# Patient Record
Sex: Female | Born: 1950
Health system: Southern US, Community
[De-identification: ages and names within clinical notes are randomized; demographics above are authoritative.]

## PROBLEM LIST (undated history)

## (undated) DIAGNOSIS — Z923 Personal history of irradiation: Secondary | ICD-10-CM

## (undated) DIAGNOSIS — K635 Polyp of colon: Secondary | ICD-10-CM

## (undated) DIAGNOSIS — I4891 Unspecified atrial fibrillation: Secondary | ICD-10-CM

## (undated) DIAGNOSIS — T7840XA Allergy, unspecified, initial encounter: Secondary | ICD-10-CM

## (undated) DIAGNOSIS — Z860100 Personal history of colon polyps, unspecified: Secondary | ICD-10-CM

## (undated) DIAGNOSIS — F419 Anxiety disorder, unspecified: Secondary | ICD-10-CM

## (undated) DIAGNOSIS — E785 Hyperlipidemia, unspecified: Secondary | ICD-10-CM

## (undated) DIAGNOSIS — D219 Benign neoplasm of connective and other soft tissue, unspecified: Secondary | ICD-10-CM

## (undated) DIAGNOSIS — M1990 Unspecified osteoarthritis, unspecified site: Secondary | ICD-10-CM

## (undated) DIAGNOSIS — R011 Cardiac murmur, unspecified: Secondary | ICD-10-CM

## (undated) DIAGNOSIS — K219 Gastro-esophageal reflux disease without esophagitis: Secondary | ICD-10-CM

## (undated) DIAGNOSIS — E663 Overweight: Secondary | ICD-10-CM

## (undated) DIAGNOSIS — B019 Varicella without complication: Secondary | ICD-10-CM

## (undated) HISTORY — PX: NOSE SURGERY: SHX723

## (undated) HISTORY — DX: Hyperlipidemia, unspecified: E78.5

## (undated) HISTORY — DX: Benign neoplasm of connective and other soft tissue, unspecified: D21.9

## (undated) HISTORY — PX: TONSILLECTOMY: SUR1361

## (undated) HISTORY — DX: Anxiety disorder, unspecified: F41.9

## (undated) HISTORY — DX: Allergy, unspecified, initial encounter: T78.40XA

## (undated) HISTORY — PX: ROTATOR CUFF REPAIR: SHX139

## (undated) HISTORY — DX: Personal history of colonic polyps: Z86.010

## (undated) HISTORY — DX: Cardiac murmur, unspecified: R01.1

## (undated) HISTORY — DX: Personal history of colon polyps, unspecified: Z86.0100

## (undated) HISTORY — DX: Overweight: E66.3

## (undated) HISTORY — DX: Varicella without complication: B01.9

## (undated) HISTORY — DX: Unspecified osteoarthritis, unspecified site: M19.90

## (undated) HISTORY — DX: Gastro-esophageal reflux disease without esophagitis: K21.9

## (undated) HISTORY — PX: WISDOM TOOTH EXTRACTION: SHX21

## (undated) HISTORY — DX: Polyp of colon: K63.5

---
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
## 1999-09-17 ENCOUNTER — Encounter: Payer: Self-pay | Admitting: Emergency Medicine

## 1999-09-17 ENCOUNTER — Emergency Department (HOSPITAL_COMMUNITY): Admission: EM | Admit: 1999-09-17 | Discharge: 1999-09-17 | Payer: Self-pay | Admitting: Emergency Medicine

## 2001-01-07 ENCOUNTER — Encounter: Payer: Self-pay | Admitting: Occupational Medicine

## 2001-01-07 ENCOUNTER — Encounter: Admission: RE | Admit: 2001-01-07 | Discharge: 2001-01-07 | Payer: Self-pay | Admitting: Occupational Medicine

## 2001-01-14 ENCOUNTER — Other Ambulatory Visit: Admission: RE | Admit: 2001-01-14 | Discharge: 2001-01-14 | Payer: Self-pay | Admitting: *Deleted

## 2001-04-06 ENCOUNTER — Ambulatory Visit (HOSPITAL_BASED_OUTPATIENT_CLINIC_OR_DEPARTMENT_OTHER): Admission: RE | Admit: 2001-04-06 | Discharge: 2001-04-07 | Payer: Self-pay | Admitting: Orthopedic Surgery

## 2001-05-26 ENCOUNTER — Emergency Department (HOSPITAL_COMMUNITY): Admission: EM | Admit: 2001-05-26 | Discharge: 2001-05-27 | Payer: Self-pay | Admitting: Emergency Medicine

## 2002-02-01 ENCOUNTER — Encounter: Payer: Self-pay | Admitting: *Deleted

## 2002-02-01 ENCOUNTER — Ambulatory Visit (HOSPITAL_COMMUNITY): Admission: RE | Admit: 2002-02-01 | Discharge: 2002-02-01 | Payer: Self-pay | Admitting: *Deleted

## 2004-07-25 ENCOUNTER — Encounter: Admission: RE | Admit: 2004-07-25 | Discharge: 2004-07-25 | Payer: Self-pay | Admitting: Cardiovascular Disease

## 2004-07-30 ENCOUNTER — Other Ambulatory Visit: Admission: RE | Admit: 2004-07-30 | Discharge: 2004-07-30 | Payer: Self-pay | Admitting: Obstetrics and Gynecology

## 2004-08-08 ENCOUNTER — Ambulatory Visit (HOSPITAL_COMMUNITY): Admission: RE | Admit: 2004-08-08 | Discharge: 2004-08-08 | Payer: Self-pay | Admitting: Obstetrics and Gynecology

## 2004-09-11 ENCOUNTER — Ambulatory Visit (HOSPITAL_COMMUNITY): Admission: RE | Admit: 2004-09-11 | Discharge: 2004-09-11 | Payer: Self-pay | Admitting: Obstetrics and Gynecology

## 2004-10-29 ENCOUNTER — Encounter (INDEPENDENT_AMBULATORY_CARE_PROVIDER_SITE_OTHER): Payer: Self-pay | Admitting: *Deleted

## 2004-10-29 ENCOUNTER — Ambulatory Visit (HOSPITAL_COMMUNITY): Admission: RE | Admit: 2004-10-29 | Discharge: 2004-10-29 | Payer: Self-pay | Admitting: Obstetrics and Gynecology

## 2006-09-12 ENCOUNTER — Emergency Department (HOSPITAL_COMMUNITY): Admission: EM | Admit: 2006-09-12 | Discharge: 2006-09-12 | Payer: Self-pay | Admitting: Internal Medicine

## 2007-05-26 ENCOUNTER — Emergency Department (HOSPITAL_COMMUNITY): Admission: EM | Admit: 2007-05-26 | Discharge: 2007-05-26 | Payer: Self-pay | Admitting: Emergency Medicine

## 2007-10-17 ENCOUNTER — Emergency Department (HOSPITAL_COMMUNITY): Admission: EM | Admit: 2007-10-17 | Discharge: 2007-10-17 | Payer: Self-pay | Admitting: Family Medicine

## 2007-11-23 ENCOUNTER — Ambulatory Visit: Payer: Self-pay | Admitting: Family Medicine

## 2007-12-23 ENCOUNTER — Emergency Department (HOSPITAL_COMMUNITY): Admission: EM | Admit: 2007-12-23 | Discharge: 2007-12-23 | Payer: Self-pay | Admitting: Family Medicine

## 2008-01-11 ENCOUNTER — Other Ambulatory Visit: Admission: RE | Admit: 2008-01-11 | Discharge: 2008-01-11 | Payer: Self-pay | Admitting: *Deleted

## 2008-03-13 ENCOUNTER — Other Ambulatory Visit: Admission: RE | Admit: 2008-03-13 | Discharge: 2008-03-13 | Payer: Self-pay | Admitting: Gynecology

## 2010-10-10 ENCOUNTER — Emergency Department (HOSPITAL_COMMUNITY)
Admission: EM | Admit: 2010-10-10 | Discharge: 2010-10-10 | Payer: Self-pay | Source: Home / Self Care | Admitting: Family Medicine

## 2010-11-23 ENCOUNTER — Encounter: Payer: Self-pay | Admitting: Cardiology

## 2011-01-12 LAB — POCT I-STAT, CHEM 8
BUN: 10 mg/dL (ref 6–23)
Calcium, Ion: 1.15 mmol/L (ref 1.12–1.32)
HCT: 43 % (ref 36.0–46.0)
Hemoglobin: 14.6 g/dL (ref 12.0–15.0)
Potassium: 3.9 mEq/L (ref 3.5–5.1)
Sodium: 139 mEq/L (ref 135–145)

## 2011-03-20 NOTE — Op Note (Signed)
NAMENORETA, KUE                ACCOUNT NO.:  [id number]   MEDICAL RECORD NO.:  [id number]          PATIENT TYPE:  AMB   LOCATION:  SDC                           FACILITY:  [facility]   PHYSICIAN:  Naima A. Dillard, M.D. DATE OF BIRTH:  [birth year]   DATE OF PROCEDURE:  10/29/2004  DATE OF DISCHARGE:                                 OPERATIVE REPORT   PREOPERATIVE DIAGNOSES:  1.  Menorrhagia.  2.  Uterine fibroids.   POSTOPERATIVE DIAGNOSES:  1.  Menorrhagia.  2.  Uterine fibroids.   PROCEDURE:  Dilatation and curettage, hysteroscopy, endometrial ablation  with NovaSure.   SURGEON:  Naima A. Dillard, M.D.   ESTIMATED BLOOD LOSS:  Minimal.   FLUIDS REPLACED:  1300 crystalloid.   COMPLICATIONS:  None.   ANESTHESIA:  Laryngeal mask airway general.   FINDINGS:  Vulvar and vaginal exams within normal limits.  Uterus was about  8 cm, no adnexal masses palpated.  The uterus did sound to 9 cm.  Cervical  length was 4 cm, cavity length was 5 cm, cavity width was 4.6 cm, and power  was 127.  The time of ablation was 1 minute 3 seconds.   PROCEDURE IN DETAIL:  The patient was taken to the operating room, where she  was given anesthesia with laryngeal mask airway, prepped and draped in a  normal sterile fashion.  The bladder was drained of about 20 mL.  A bivalve  speculum was placed into the vagina.  The anterior lip of the cervix was  grasped with a single-tooth tenaculum.  The cervix was already dilated.  This patient is on her menses and the uterus did sound to 9 cm .  The  cervical length measured about 4 cm with a hegar.  The hysteroscope was then  placed into the uterine cavity, and there was fluffy endometrium.  No  submucosal component of fibroid could be seen.  The hysteroscope was then  removed and the sharp curettage was done of the uterine cavity until a  gritty texture was noted.  Hemostasis was assured.  With the representative,  Adam, from [employer], we then began the  procedure.  The cavity length was set  at 5 cm.  The NovaSure was then placed into the cavity.  The CO2 seal was  then tested, noted to be normal.  The uterus was then seated without  difficulty.  The ablation was then done without difficulty.  The NovaSure  was removed.  All  instruments were removed from the vagina.  The tenaculum was removed from  the cervix without difficulty.  There was some bleeding at the tenaculum  site, which was made hemostatic with silver nitrate.  Sponge, lap, and  needle counts were correct x2.     Naim   NAD/MEDQ  D:  10/29/2004  T:  10/29/2004  Job:  [id number]

## 2011-03-20 NOTE — H&P (Signed)
[name], [name]                ACCOUNT NO.:  [id number]   MEDICAL RECORD NO.:  [id number]          PATIENT TYPE:  AMB   LOCATION:  SDC                           FACILITY:  [facility]   PHYSICIAN:  Naima A. Dillard, M.D. DATE OF BIRTH:  27-Mar-1951   DATE OF ADMISSION:  DATE OF DISCHARGE:                                HISTORY & PHYSICAL   CHIEF COMPLAINT:  Menorrhagia.   HISTORY OF PRESENT ILLNESS:  The patient is a 60 year old gravida 1, para 1  who presented with complaint of increase in the flow of her menses over the  past couple of months.  She states it has been heavier than normal with  cramping.  She states that her periods come every month and last for 7 days.  She is changing a tampon to a pad every 1 to 4 hours.  Denies taking any  contraception.  She does have fibroids.  No history of hormone therapy.  She  does have some menopausal symptoms.  She is currently not on any new  medications.  Denies any vaginal discharge or abdominal pain.  Does have  some increased stress.   PAST GYNECOLOGICAL HISTORY:  1.  Significant for lichen sclerosis, just newly diagnosed.  2.  She had an abnormal Pap smear many years ago.  Repeat Pap smear was      within normal limits and her last Pap smear was normal.  3.  Denies any history of any sexually transmitted diseases.   ALLERGIES:  The patient has no known drug allergies.   MEDICATIONS:  Xanax.   PAST OBSTETRICAL HISTORY:  Vaginal delivery x1.   PAST SURGICAL HISTORY:  1.  Repair of left rotator cuff tear in 2002.  2.  She had a tonsillectomy.  3.  She had a bilateral tubal ligation.   FAMILY HISTORY:  1.  Heart disease.  2.  Hypertension.  3.  Diabetes.   REVIEW OF SYSTEMS:  HEENT:  She wars corrective lenses.  PSYCHIATRIC:  She  has anxiety.  GENITOURINARY:  As above.  GI:  Unremarkable.  CARDIOVASCULAR:  Unremarkable.  ENDOCRINE:  Unremarkable.   PHYSICAL EXAMINATION:  VITAL SIGNS:  The patient weighs 207 pounds.  Blood  pressure is 108/68.  GENERAL:  She is in no apparent distress.  HEART:  Regular rate and rhythm.  HEENT:  Within normal limits.  NECK:  Free range of motion and supple.  LUNGS:  Clear to auscultation bilaterally.  BREASTS:  Without masses, nipple discharge, skin changes, or nipple  retraction bilaterally.  ABDOMEN:  Nondistended, soft, and nontender.  The patient has no adenopathy.  PELVIC:  External genitalia are within normal limits.  Vagina is within  normal limits.  Cervix is within normal limits with no cervical motion  tenderness or masses.  Uterus is normal size, shape, and consistency and  nontender.  There are no adnexal masses palpated.  RECTAL:  No masses with normal tone.  EXTREMITIES:  No cyanosis, clubbing, or edema bilaterally.  SKIN:  The patient has moist mucous membranes.   PERTINENT LABORATORY DATA:  The patient had  an ultrasound and  sonohysterogram.  On ultrasound, her uterus measured 9.9 x 5.8 x 6.1 cm.  Endometrial stripe was 2.3 cm.  She has an anterior fibroid measuring 2 x  1.9 cm with a 15% component that is submucosal on sonohysterogram.  Her GC  and Chlamydia were negative.  Pap smear was within normal limits.  Endometrial biopsy shows benign secretory endometrium.  TSH is normal at  1.687.   ASSESSMENT:  Menorrhagia and intramural fibroid with small submucosal  component.   PLAN:  A D&C, hysteroscopy, possible resection of the submucosal component  of the fibroid if able to be visualized, and endometrial ablation.  The  patient understands that the risks are, but not limited to, bleeding;  infection; perforation of the uterus; damage to internal organs like bowel  and bladder and major blood vessels especially using the Novasure.  The  patient has consented to this procedure.     Naim   NAD/MEDQ  D:  10/29/2004  T:  10/29/2004  Job:  [id number]

## 2011-03-20 NOTE — Op Note (Signed)
. [facility]  Patient:    [name], [name]                         MRN: [id number] Proc. Date: 04/06/01 Adm. Date:  [id number] Attending:  Teena Dunk                           Operative Report  PREOPERATIVE DIAGNOSIS:  Impingement with partial rotator cuff tear.  POSTOPERATIVE DIAGNOSES: 1. Impingement with partial rotator cuff tear. 2. Degenerative tear of anterior superior labrum. 3. Acromioclavicular joint arthritis. 4. Adhesive capsulitis of the shoulder.  PROCEDURES: 1. Examination under anesthesia. 2. Arthroscopic acromioplasty with rotator cuff debridement. 3. Arthroscopic debridement, torn labrum. 4. Arthroscopic excision, distal clavicle.  SURGEON:  Sharlot Gowda., M.D.  ANESTHESIA:  General.  INDICATIONS:  A 60 year old, severe shoulder pain with MRI suggesting complete cuff tear, thought to be amenable as an overnight hospitalization for arthroscopy and possible repair.  DESCRIPTION OF PROCEDURE:  Arthroscope through a posterior portal anterior and lateral.  Prior to systematic inspection of the joint, shoulder was noted to have limited abduction to 80 and external rotation to 60.  Therefore, at the conclusion of the case, the arm was manipulated with 120 degrees of abduction and external rotation to 90.  The intra-articular portion of the exam showed some fraying-type tearing of the labrum, some degenerative tearing at the attachment of the biceps tendon, but no frank instability.  Debridement was carried out of the labral tissue.  No degenerative change was noted on the humerus.  Undersurface small partial-thickness cuff tear.  It was more of an abrasion-type superior bursal surface tear.  It was then not complete, which was debrided.  Moderate impingement from a prominent CA ligament, AC joint arthritis, which was moderate, requiring resection of the distal centimeter, centimeter and a half of the clavicle,  release of the impingement with resection of about 7-8 mm bone from the anterior leading edge of the acromion. Complete bursectomy from what was a moderately inflamed bursa.  Superior surface of the cuff, _____ complete tear.  Resection last checked from the anterior and lateral portal.  Shoulder was drained free of fluid, portals closed with nylon, lightly compressive sterile dressing applied.  Taken to the recovery room in stable condition. DD:  04/06/01 TD:  04/07/01 Job: [id number]

## 2011-04-21 ENCOUNTER — Encounter: Payer: Self-pay | Admitting: Internal Medicine

## 2012-05-01 ENCOUNTER — Emergency Department (HOSPITAL_COMMUNITY)
Admission: EM | Admit: 2012-05-01 | Discharge: 2012-05-01 | Disposition: A | Discharge status: Home / Self Care | Payer: Self-pay | Attending: Emergency Medicine | Admitting: Emergency Medicine

## 2012-05-01 ENCOUNTER — Encounter (HOSPITAL_COMMUNITY): Payer: Self-pay | Admitting: Emergency Medicine

## 2012-05-01 DIAGNOSIS — F419 Anxiety disorder, unspecified: Secondary | ICD-10-CM

## 2012-05-01 DIAGNOSIS — F411 Generalized anxiety disorder: Secondary | ICD-10-CM | POA: Insufficient documentation

## 2012-05-01 DIAGNOSIS — N318 Other neuromuscular dysfunction of bladder: Secondary | ICD-10-CM | POA: Insufficient documentation

## 2012-05-01 DIAGNOSIS — N3281 Overactive bladder: Secondary | ICD-10-CM

## 2012-05-01 LAB — URINALYSIS, ROUTINE W REFLEX MICROSCOPIC
Bilirubin Urine: NEGATIVE
Glucose, UA: NEGATIVE mg/dL
Ketones, ur: NEGATIVE mg/dL
Nitrite: NEGATIVE
Protein, ur: NEGATIVE mg/dL
Specific Gravity, Urine: 1.011 (ref 1.005–1.030)
Urobilinogen, UA: 0.2 mg/dL (ref 0.0–1.0)
pH: 7.5 (ref 5.0–8.0)

## 2012-05-01 LAB — URINE MICROSCOPIC-ADD ON

## 2012-05-01 MED ORDER — PHENAZOPYRIDINE HCL 100 MG PO TABS
200.0000 mg | ORAL_TABLET | Freq: Once | ORAL | Status: AC
  Administered 2012-05-01: 200 mg via ORAL
  Filled 2012-05-01: qty 2

## 2012-05-01 MED ORDER — ALPRAZOLAM 0.25 MG PO TABS: 0.2500 mg | ORAL_TABLET | Freq: Three times a day (TID) | ORAL | Status: AC | PRN

## 2012-05-01 NOTE — ED Notes (Signed)
Pt discharged home. Encouraged to follow up with PCP if problems continue.

## 2012-05-01 NOTE — ED Notes (Addendum)
Pt here with multiple complaints ongoing since January. Pt reports urinary frequency and pressure, feeling stress in her life due to family issues and upper back pain. Pt took an antibiotic from her boyfriend and using over the counter probiotic.

## 2012-05-01 NOTE — ED Provider Notes (Addendum)
History   This chart was scribed for Gavin Pound. Tymere Depuy, MD scribed by Magnus Sinning. The patient was seen in room TR11C/TR11C seen at 13:37  CSN: [id number]  Arrival date & time 05/01/12  1303   None     Chief Complaint  Patient presents with  . Urinary Frequency  . Back Pain    (Consider location/radiation/quality/duration/timing/severity/associated sxs/prior treatment) HPI [name] is a 61 y.o. female who presents to the Emergency Department complaining of constant moderate abd pressure with associated increased urinary frequency, onset several months. Also reports associated intermittent back pain, onset several days, that is aggravated upon standing, after she has been laying down for long periods of time.States she has been drinking cranberry juice for urinary sxs with no relief. Patient explains she has been under tremendous stress and that it's caused her to be more sedentary at home ,which she suspects might also be aggravating. She says she's taken probiotic and an abx of amoxil, which she states she's taken today with mild relief. Denies fevers, or vomiting. Patient provides document from recent examination, and EDMD notes C-reactive protein, creatinine, and TSH are all within normal limits. Hx of one vaginal delivery at 61 years old and hx of UTI several years ago. Denies any other vaginal  sxs.    History reviewed. No pertinent past medical history.  Past Surgical History  Procedure Date  . Tonsillectomy     History reviewed. No pertinent family history.  History  Substance Use Topics  . Smoking status: Never Smoker   . Smokeless tobacco: Not on file  . Alcohol Use: Yes     seldom   Review of Systems  Constitutional: Negative for fever.  Gastrointestinal: Negative for vomiting.       Abd pressure  Genitourinary: Positive for frequency.    Allergies  Review of patient's allergies indicates no known allergies.  Home Medications   Current Outpatient Rx    Name Route Sig Dispense Refill  . VITAMIN C PO Oral Take 1 tablet by mouth daily.    Marland Kitchen CALCIUM-VITAMIN D PO Oral Take 1 tablet by mouth daily.    Marland Kitchen FLORA-Q PO Oral Take 1 capsule by mouth daily.    Marland Kitchen ALPRAZOLAM 0.25 MG PO TABS Oral Take 1 tablet (0.25 mg total) by mouth 3 (three) times daily as needed for anxiety. 15 tablet 0    BP 127/58  Pulse 81  Temp 98.3 F (36.8 C) (Oral)  Resp 18  SpO2 96%  LMP 04/28/2012  Physical Exam  Nursing note and vitals reviewed. Constitutional: She is oriented to person, place, and time. She appears well-developed and well-nourished. No distress.  HENT:  Head: Normocephalic and atraumatic.  Eyes: EOM are normal. No scleral icterus.  Neck: Normal range of motion. Neck supple. No tracheal deviation present.  Cardiovascular: Normal rate.   Pulmonary/Chest: Effort normal. No respiratory distress.  Abdominal: Soft. Normal appearance and bowel sounds are normal. There is no tenderness. There is no rebound, no guarding, no CVA tenderness, no tenderness at McBurney's point and negative Murphy's sign.       Negative Murphy's   Musculoskeletal: Normal range of motion.       Lumbar back: She exhibits normal range of motion, no bony tenderness, no deformity, no spasm and normal pulse.  Neurological: She is alert and oriented to person, place, and time. She has normal strength. No sensory deficit. She exhibits normal muscle tone. Gait normal.  Skin: Skin is warm, dry and intact. No  rash noted. No pallor.  Psychiatric: She has a normal mood and affect. Her behavior is normal.    ED Course  Procedures (including critical care time) DIAGNOSTIC STUDIES: Oxygen Saturation is 96% on room air , normal by my interpretation.    COORDINATION OF CARE:   Labs Reviewed  URINALYSIS, ROUTINE W REFLEX MICROSCOPIC - Abnormal; Notable for the following:    APPearance CLOUDY (*)     Hgb urine dipstick TRACE (*)     Leukocytes, UA MODERATE (*)     All other components  within normal limits  URINE MICROSCOPIC-ADD ON - Abnormal; Notable for the following:    Squamous Epithelial / LPF FEW (*)     All other components within normal limits      1. Overactive bladder   2. Anxiety     2:59 PM UA suggests to UTI with fe bacteria and 0-2 WBC's.  Culture sent.    3:14 PM  Pt requests something for anxiety.  She admits that she is stressed due primarily with sister who has stage 4 cancer.  She hasn't been sleeping well.  She thinks some of her symptoms may be due to this.  She has been put on xanax in the past by her PCP.  She would like just a few days of this.  She will see PCP to reassess this in the near future.  Will give her a lo dose Rx for xanax here.     MDM  I personally performed the services described in this documentation, which was scribed in my presence. The recorded information has been reviewed and considered.   Pt in no distress.  abd is soft, no guard or rebound, non tender.  No GYN symptoms.  Dysuria and symptoms of bladder spasms for months.  Pt encouraged to obtain PCP.  Will check for UTI here and also try pyridium for symptom relief.              Gavin Pound. Oletta Lamas, MD 05/01/12 1504  Gavin Pound. Tangelia Sanson, MD 05/01/12 1516

## 2012-05-01 NOTE — Discharge Instructions (Signed)
Overactive Bladder, Adult  The bladder has two functions that are totally opposite of the other. One is to relax and stretch out so it can store urine (fills like a balloon), and the other is to contract and squeeze down so that it can empty the urine that it has stored. Proper functioning of the bladder is a complex mixing of these two functions. The filling and emptying of the bladder can be influenced by:   The bladder.    The spinal cord.    The brain.    The nerves going to the bladder.    Other organs that are closely related to the bladder such as prostate in males and the vagina in females.   As your bladder fills with urine, nerve signals are sent from the bladder to the brain to tell you that you may need to urinate. Normal urination requires that the bladder squeeze down with sufficient strength to empty the bladder, but this also requires that the bladder squeeze down sufficiently long to finish the job. In addition the sphincter muscles, which normally keep you from leaking urine, must also relax so that the urine can pass. Coordination between the bladder muscle squeezing down and the sphincter muscles relaxing is required to make everything happen normally.  With an overactive bladder sometimes the muscles of the bladder contract unexpectedly and involuntarily and this causes an urgent need to urinate. The normal response is to try to hold urine in by contracting the sphincter muscles. Sometimes the bladder contracts so strongly that the sphincter muscles cannot stop the urine from passing out and incontinence occurs. This kind of incontinence is called urge incontinence.  Having an overactive bladder can be embarrassing and awkward. It can keep you from living life the way you want to. Many people think it is just something you have to put up with as you grow older or have certain health conditions. In fact, there are treatments that can help make your life easier and more pleasant.  CAUSES     Many things can cause an overactive bladder. Possibilities include:   Urinary tract infection or infection of nearby tissues such as the prostate.    Prostate enlargement.    In women, multiple pregnancies or surgery on the uterus or urethra.    Bladder stones, inflammation or tumors.    Caffeine.    Alcohol.    Medications. For example, diuretics (drugs that help the body get rid of extra fluid) increase urine production. Some other medicines must be taken with lots of fluids.    Muscle or nerve weakness. This might be the result of a spinal cord injury, a stroke, multiple sclerosis or Parkinson's disease.    Diabetes can cause a high urine volume which fills the bladder so quickly that the normal urge to urinate is triggered very strongly.   SYMPTOMS     Loss of bladder control. You feel the need to urinate and cannot make your body wait.    Sudden, strong urges to urinate.    Urinating 8 or more times a day.    Waking up to urinate two or more times a night.   DIAGNOSIS    To decide if you have overactive bladder, your healthcare provider will probably:   Ask about symptoms you have noticed.    Ask about your overall health. This will include questions about any medications you are taking.    Do a physical examination. This will help determine if there are   obvious blockages or other problems.    Order some tests. These might include:    A blood test to check for diabetes or other health issues that could be contributing to the problem.    Urine testing. This could measure the flow of urine and the pressure on the bladder.    A test of your neurological system (the brain, spinal cord and nerves). This is the system that senses the need to urinate. Some of these tests are called flow tests, bladder pressure tests and electrical measurements of the sphincter muscle.    A bladder test to check whether it is emptying completely when you urinate.     Cytoscopy. This test uses a thin tube with a tiny camera on it. It offers a look inside your urethra and bladder to see if there are problems.    Imaging tests. You might be given a contrast dye and then asked to urinate. X-rays are taken to see how your bladder is working.   TREATMENT    An overactive bladder can be treated in many ways. The treatment will depend on the cause. Whether you have a mild or severe case also makes a difference. Often, treatment can be given in your healthcare provider's office or clinic. Be sure to discuss the different options with your caregiver. They include:   Behavioral treatments. These do not involve medication or surgery:    Bladder training. For this, you would follow a schedule to urinate at regular intervals. This helps you learn to control the urge to urinate. At first, you might be asked to wait a few minutes after feeling the urge. In time, you should be able to schedule bathroom visits an hour or more apart.    Kegel exercises. These exercises strengthen the pelvic floor muscles, which support the bladder. By toning these muscles, they can help control urination, even if the bladder muscles are overactive. A specialist will teach you how to do these exercises correctly. They will require daily practice.    Weight loss. If you are obese or overweight, losing weight might stop your bladder from being overactive. Talk to your healthcare provider about how many pounds you should lose. Also ask if there is a specific program or method that would work best for you.    Diet change. This might be suggested if constipation is making your overactive bladder worse. Your healthcare provider or a nutritionist can explain ways to change what you eat to ease constipation. Other people might need to take in less caffeine or alcohol. Sometimes drinking fewer fluids is needed, too.     Protection. This is not an actual treatment. But, you could wear special pads to take care of any leakage while you wait for other treatments to take effect. This will help you avoid embarrassment.    Physical treatments.    Electrical stimulation. Electrodes will send gentle pulses to the nerves or muscles that help control the bladder. The goal is to strengthen them. Sometimes this is done with the electrodes outside of the body. Or, they might be placed inside the body (implanted). This treatment can take several months to have an effect.    Medications. These are usually used along with other treatments. Several medicines are available. Some are injected into the muscles involved in urination. Others come in pill form. Medications sometimes prescribed include:    Anticholinergics. These drugs block the signals that the nerves deliver to the bladder. This keeps it from releasing urine   at the wrong time. Researchers think the drugs might help in other ways, too.    Imipramine. This is an antidepressant. But, it relaxes bladder muscles.    Botox. This is still experimental. Some people believe that injecting it into the bladder muscles will relax them so they work more normally. It has also been injected into the sphincter muscle when the sphincter muscle does not open properly. This is a temporary fix, however. Also, it might make matters worse, especially in older people.    Surgery.    A device might be implanted to help manage your nerves. It works on the nerves that signal when you need to urinate.    Surgery is sometimes needed with electrical stimulation. If the electrodes are implanted, this is done through surgery.    Sometimes repairs need to be made through surgery. For example, the size of the bladder can be changed. This is usually done in severe cases only.   HOME CARE INSTRUCTIONS      Take any medications your healthcare provider prescribed or suggested. Follow the directions carefully.    Practice any lifestyle changes that are recommended. These might include:    Drinking less fluid or drinking at different times of the day. If you need to urinate often during the night, for example, you may need to stop drinking fluids early in the evening.    Cutting down on caffeine or alcohol. They can both make an overactive bladder worse. Caffeine is found in coffee, tea and sodas.    Doing Kegel exercises to strengthen muscles.    Losing weight, if that is recommended.    Eating a healthy and balanced diet. This will help you avoid constipation.    Keep a journal or a log. You might be asked to record how much you drink and when, and also when you feel the need to urinate.    Learn how to care for implants or other devices, such as pessaries.   SEEK MEDICAL CARE IF:     Your overactive bladder gets worse.    You feel increased pain or irritation when you urinate.    You notice blood in your urine.    You have questions about any medications or devices that your healthcare provider recommended.    You notice blood, pus or swelling at the site of any test or treatment procedure.    You have an oral temperature above 102 F (38.9 C).   SEEK IMMEDIATE MEDICAL CARE IF:    You have an oral temperature above 102 F (38.9 C), not controlled by medicine.  Document Released: 08/15/2009 Document Revised: 10/08/2011 Document Reviewed: 08/15/2009  [employer] Patient Information 2012 [employer]

## 2012-06-01 ENCOUNTER — Other Ambulatory Visit: Payer: Self-pay | Admitting: Family Medicine

## 2012-06-01 ENCOUNTER — Ambulatory Visit
Admission: RE | Admit: 2012-06-01 | Discharge: 2012-06-01 | Disposition: A | Discharge status: Home / Self Care | Payer: 59 | Source: Ambulatory Visit | Attending: Family Medicine | Admitting: Family Medicine

## 2012-06-01 ENCOUNTER — Other Ambulatory Visit: Payer: Self-pay | Admitting: Ophthalmology

## 2012-06-01 DIAGNOSIS — Z87891 Personal history of nicotine dependence: Secondary | ICD-10-CM

## 2012-07-28 ENCOUNTER — Encounter: Payer: Self-pay | Admitting: Internal Medicine

## 2014-02-13 ENCOUNTER — Ambulatory Visit: Payer: Self-pay | Admitting: Podiatry

## 2014-04-19 ENCOUNTER — Ambulatory Visit (INDEPENDENT_AMBULATORY_CARE_PROVIDER_SITE_OTHER): Payer: No Typology Code available for payment source | Admitting: Podiatry

## 2014-04-19 ENCOUNTER — Encounter: Payer: Self-pay | Admitting: Podiatry

## 2014-04-19 ENCOUNTER — Other Ambulatory Visit: Payer: Self-pay | Admitting: *Deleted

## 2014-04-19 VITALS — BP 99/62 | HR 72 | Resp 16 | Ht 67.0 in | Wt 215.0 lb

## 2014-04-19 DIAGNOSIS — M205X9 Other deformities of toe(s) (acquired), unspecified foot: Secondary | ICD-10-CM

## 2014-04-19 DIAGNOSIS — G5760 Lesion of plantar nerve, unspecified lower limb: Secondary | ICD-10-CM

## 2014-04-19 DIAGNOSIS — M205X1 Other deformities of toe(s) (acquired), right foot: Secondary | ICD-10-CM

## 2014-04-19 DIAGNOSIS — G5791 Unspecified mononeuropathy of right lower limb: Secondary | ICD-10-CM

## 2014-04-19 MED ORDER — TRIAMCINOLONE ACETONIDE 0.1 % EX CREA: 1.0000 "application " | TOPICAL_CREAM | Freq: Two times a day (BID) | CUTANEOUS | Status: DC

## 2014-04-19 NOTE — Progress Notes (Signed)
   Subjective:    Patient ID: [name], female    DOB: May 02, 1951, 63 y.o.   MRN: [id number]  HPI Comments: Right plantar foot has been itching, it is itching in one spot, right 1st metatarsal. i have tried to soak my foot in vinegar , peroxide, tee tree oil, epsom salt. And nothing seems to work. It has not been a constant , trying to cut back on my sugar and it has not been itching as much      Review of Systems  Musculoskeletal:       Joint pain  Difficulty walking   Psychiatric/Behavioral: The patient is not hyperactive.   All other systems reviewed and are negative.      Objective:   Physical Exam: I have reviewed her past medical history medications allergies surgeries social history and review of systems. Pulses are strongly palpable bilateral. Neurologic sensorium is intact per since once the monofilament. Deep tendon reflexes are intact bilateral muscle strength is 5 over 5 dorsiflexors plantar flexors inverters everters all intrinsic musculature is intact. Orthopedic evaluation demonstrates all joints distal to the ankle a full range of motion with exception of the first metatarsophalangeal joint bilaterally. Hallux limitus with crepitation right first metatarsophalangeal joint bleeds to no crepitation in the left foot. This limitation has resulted in a reactive hyperkeratotic lesion to the plantar aspect of the first metatarsophalangeal joint the skin is intact but it is just simply hyperkeratotic. Obviously this is a shear injury. No rash about the foot at all.        Assessment & Plan:  Assessment: Hallux limitus with osteoarthritis first metatarsophalangeal joint left foot greater than right. Neuritis sub-first metatarsophalangeal joint right foot with puritis. I started her on triamcinolone cream twice daily and I also injected Kenalog sub-first metatarsophalangeal joint. Followup with her on an as-needed basis.

## 2015-02-09 LAB — HM MAMMOGRAPHY: HM Mammogram: NORMAL

## 2015-03-01 ENCOUNTER — Encounter: Payer: Self-pay | Admitting: Primary Care

## 2015-03-01 ENCOUNTER — Ambulatory Visit (INDEPENDENT_AMBULATORY_CARE_PROVIDER_SITE_OTHER): Payer: 59 | Admitting: Primary Care

## 2015-03-01 VITALS — BP 116/72 | HR 80 | Temp 97.4°F | Ht 67.0 in | Wt 244.1 lb

## 2015-03-01 DIAGNOSIS — E559 Vitamin D deficiency, unspecified: Secondary | ICD-10-CM

## 2015-03-01 DIAGNOSIS — R351 Nocturia: Secondary | ICD-10-CM

## 2015-03-01 DIAGNOSIS — Z87448 Personal history of other diseases of urinary system: Secondary | ICD-10-CM | POA: Diagnosis not present

## 2015-03-01 DIAGNOSIS — R3581 Nocturnal polyuria: Secondary | ICD-10-CM

## 2015-03-01 DIAGNOSIS — F411 Generalized anxiety disorder: Secondary | ICD-10-CM

## 2015-03-01 DIAGNOSIS — E669 Obesity, unspecified: Secondary | ICD-10-CM

## 2015-03-01 DIAGNOSIS — Z1283 Encounter for screening for malignant neoplasm of skin: Secondary | ICD-10-CM | POA: Diagnosis not present

## 2015-03-01 DIAGNOSIS — F419 Anxiety disorder, unspecified: Secondary | ICD-10-CM | POA: Insufficient documentation

## 2015-03-01 LAB — POCT URINALYSIS DIPSTICK
BILIRUBIN UA: NEGATIVE
Blood, UA: NEGATIVE
Glucose, UA: NEGATIVE
KETONES UA: NEGATIVE
LEUKOCYTES UA: NEGATIVE
Nitrite, UA: NEGATIVE
PROTEIN UA: NEGATIVE
SPEC GRAV UA: 1.025
Urobilinogen, UA: 4
pH, UA: 6

## 2015-03-01 NOTE — Assessment & Plan Note (Signed)
Unhealthy diet and does not exercise. She's aware of the changes she needs to make and has committed to start improving. We spent about 30 min alone talking about the importance of healthy diet and exercise in preventing health problems, and what to cut out and include more of in her diet. I challenged her to lose 5 pounds by next visit. She verbalized understanding. Will check A1C today due to symptoms of polyuria and polydipsia.

## 2015-03-01 NOTE — Progress Notes (Signed)
Subjective:    Patient ID: [name] Frazier, female    DOB: 21-May-1951, 64 y.o.   MRN: [id number]  HPI  [name] Frazier is a 64 year old female who presents today to establish care and discuss the problems mentioned below. Will obtain old records.  1) Hematuria: Reports she provided a sample of urine at a dermatology appointment one year ago that resulted in trace hematuria. She is concerned and would like to be rechecked. She denies symptoms of dysuria and urgency. She does report frequent nocturnal urination lately.   2) Vitamin D defiency: Is taking 50,000 units of Vitamin D weekly and has been for 11 weeks. Her vitamin D level was checked by Dr. Ronita Hipps with GYN and noted to be low. She is requesting a recheck of her levels. Denies recent falls or fractures.   3) Obesity: Steady weight gain over the years and is very frustrated that she's become so heavy. She was once 150 lbs and would like to be back at that weight. She is also concerned that she may develop diabetes as she has been experiencing polyuria and polydipsia. She's noticed an increase in joint pain and believes it to be related to her weight gain.  Her diet consists of french fries, ice cream, tomato/lettuce sandwich, stirfrys with rice, chocolate, chips, pineapple and cottage cheese, spaghetti, bread and olive oil. No cakes or pies. She also admits to overeating. Every day she drinks 2 cups of coffee and adds evaporated condensed milk. She drinks water, cherry sodas, half unsweet/halfsweet tea. She does not routinely exercise and will occasionally walks for about 25 min. She has been a vegetarian for 25 years.  Body mass index is 38.23 kg/(m^2).   Wt Readings from Last 3 Encounters:  03/01/15 244 lb 1.9 oz (110.732 kg)  04/19/14 215 lb (97.523 kg)    Review of Systems  Constitutional: Negative for unexpected weight change.       Steady weight gain  HENT: Negative for rhinorrhea.   Respiratory: Negative for cough and shortness  of breath.   Cardiovascular: Negative for chest pain.  Gastrointestinal: Negative for diarrhea and constipation.  Endocrine: Positive for polydipsia and polyuria.  Genitourinary: Negative for dysuria, urgency, hematuria and difficulty urinating.  Musculoskeletal: Positive for arthralgias. Negative for myalgias.  Skin:       Has noticed changes to several of her skin spots and is seeing dermatology in May for further evaluation.  Allergic/Immunologic: Positive for environmental allergies.  Neurological: Negative for headaches.  Psychiatric/Behavioral:       See HPI.       Past Medical History  Diagnosis Date  . Anxiety   . Hyperlipidemia   . Heart murmur     History   Social History  . Marital Status: Single    Spouse Name: N/A  . Number of Children: N/A  . Years of Education: N/A   Occupational History  . Not on file.   Social History Main Topics  . Smoking status: Never Smoker   . Smokeless tobacco: Never Used  . Alcohol Use: 0.0 oz/week    0 Standard drinks or equivalent per week     Comment: seldom  . Drug Use: No  . Sexual Activity: Not on file   Other Topics Concern  . Not on file   Social History Narrative    Past Surgical History  Procedure Laterality Date  . Tonsillectomy    . Rotator cuff repair      History reviewed. No  pertinent family history.  No Known Allergies  No current outpatient prescriptions on file prior to visit.   No current facility-administered medications on file prior to visit.    BP 116/72 mmHg  Pulse 80  Temp(Src) 97.4 F (36.3 C)  Ht 5\' 7"  (1.702 m)  Wt 244 lb 1.9 oz (110.732 kg)  BMI 38.23 kg/m2  SpO2 96%  LMP 04/28/2012    Objective:   Physical Exam  Constitutional: She is oriented to person, place, and time. She appears well-developed.  Eyes: Conjunctivae and EOM are normal.  Neck: Neck supple. No thyromegaly present.  Cardiovascular: Normal rate and regular rhythm.   Pulmonary/Chest: Effort normal and  breath sounds normal.  Lymphadenopathy:    She has no cervical adenopathy.  Neurological: She is alert and oriented to person, place, and time. She has normal reflexes.  Skin: Skin is warm and dry.  Psychiatric: She has a normal mood and affect.          Assessment & Plan:  >45 minutes spent face to face with patient, >50% spent counseling or coordinating care.

## 2015-03-01 NOTE — Patient Instructions (Addendum)
Complete lab work prior to leaving today. I will notify you of your results. Please schedule a physical with me in the next 1 month. You will also schedule a lab only appointment one week prior. We will discuss your lab results during your physical. I'm challenging you to lose 5 pounds by your next visit. Today's weight is in the office is 244.  It is important that you improve your diet. Please limit carbohydrates in the form of white bread, rice, pasta, cakes, cookies, sugary drinks, etc. Increase your consumption of fresh fruits and vegetables. Be sure to drink plenty of water daily. It was a pleasure to meet you today! Please don't hesitate to call me with any questions. Welcome to [facility]!

## 2015-03-01 NOTE — Assessment & Plan Note (Signed)
Unsure of previous Vitamin D levels, will obtain records. Currently on last week of Vitamin D tablet. Will check levels today per patient's request. Will continue to monitor.

## 2015-03-01 NOTE — Assessment & Plan Note (Signed)
Diagnosed by prior PCP and provided with alprazolam tablets that she takes only at bedtime. Did not have time this visit to dive deeper into anxiety. Will re-evaluate at subsequent visit and consider SSRI.

## 2015-03-01 NOTE — Progress Notes (Signed)
Pre visit review using our clinic review tool, if applicable. No additional management support is needed unless otherwise documented below in the visit note. 

## 2015-03-02 LAB — VITAMIN D 25 HYDROXY (VIT D DEFICIENCY, FRACTURES): Vit D, 25-Hydroxy: 23 ng/mL — ABNORMAL LOW (ref 30–100)

## 2015-03-02 LAB — HEMOGLOBIN A1C
Hgb A1c MFr Bld: 5.6 % (ref <5.7)
MEAN PLASMA GLUCOSE: 114 mg/dL (ref <117)

## 2015-03-04 ENCOUNTER — Other Ambulatory Visit: Payer: Self-pay | Admitting: Primary Care

## 2015-03-04 DIAGNOSIS — E559 Vitamin D deficiency, unspecified: Secondary | ICD-10-CM

## 2015-03-04 MED ORDER — ERGOCALCIFEROL 1.25 MG (50000 UT) PO CAPS: 50000.0000 [IU] | ORAL_CAPSULE | ORAL | Status: DC

## 2015-03-05 ENCOUNTER — Encounter: Payer: Self-pay | Admitting: Primary Care

## 2015-03-05 ENCOUNTER — Telehealth: Payer: Self-pay | Admitting: Primary Care

## 2015-03-05 NOTE — Telephone Encounter (Signed)
Patient called back. Was able to notified her of [name]'s lab results notes. Patient verbalized understanding.

## 2015-03-05 NOTE — Telephone Encounter (Signed)
Left voicemail for patient to call back. 

## 2015-03-05 NOTE — Telephone Encounter (Signed)
Patient returned Chan's call. °

## 2015-03-20 ENCOUNTER — Other Ambulatory Visit: Payer: Self-pay | Admitting: Primary Care

## 2015-03-20 DIAGNOSIS — Z0000 Encounter for general adult medical examination without abnormal findings: Secondary | ICD-10-CM

## 2015-03-21 ENCOUNTER — Other Ambulatory Visit (INDEPENDENT_AMBULATORY_CARE_PROVIDER_SITE_OTHER): Payer: 59

## 2015-03-21 DIAGNOSIS — Z0000 Encounter for general adult medical examination without abnormal findings: Secondary | ICD-10-CM

## 2015-03-21 LAB — COMPREHENSIVE METABOLIC PANEL
ALK PHOS: 81 U/L (ref 39–117)
ALT: 13 U/L (ref 0–35)
AST: 14 U/L (ref 0–37)
Albumin: 3.8 g/dL (ref 3.5–5.2)
BILIRUBIN TOTAL: 0.3 mg/dL (ref 0.2–1.2)
BUN: 15 mg/dL (ref 6–23)
CO2: 27 mEq/L (ref 19–32)
Calcium: 9.4 mg/dL (ref 8.4–10.5)
Chloride: 109 mEq/L (ref 96–112)
Creatinine, Ser: 0.85 mg/dL (ref 0.40–1.20)
GFR: 72.18 mL/min (ref >60.00)
GLUCOSE: 106 mg/dL — AB (ref 70–99)
POTASSIUM: 4.1 meq/L (ref 3.5–5.1)
SODIUM: 141 meq/L (ref 135–145)
TOTAL PROTEIN: 6.7 g/dL (ref 6.0–8.3)

## 2015-03-21 LAB — CBC WITH DIFFERENTIAL/PLATELET
BASOS ABS: 0 10*3/uL (ref 0.0–0.1)
Basophils Relative: 0.6 % (ref 0.0–3.0)
EOS ABS: 0.1 10*3/uL (ref 0.0–0.7)
Eosinophils Relative: 1.2 % (ref 0.0–5.0)
HCT: 39.6 % (ref 36.0–46.0)
Hemoglobin: 13.5 g/dL (ref 12.0–15.0)
LYMPHS PCT: 34.6 % (ref 12.0–46.0)
Lymphs Abs: 2.4 10*3/uL (ref 0.7–4.0)
MCHC: 34 g/dL (ref 30.0–36.0)
MCV: 88.1 fl (ref 78.0–100.0)
MONO ABS: 0.4 10*3/uL (ref 0.1–1.0)
Monocytes Relative: 6 % (ref 3.0–12.0)
NEUTROS PCT: 57.6 % (ref 43.0–77.0)
Neutro Abs: 4 10*3/uL (ref 1.4–7.7)
PLATELETS: 248 10*3/uL (ref 150.0–400.0)
RBC: 4.5 Mil/uL (ref 3.87–5.11)
RDW: 12.8 % (ref 11.5–15.5)
WBC: 6.9 10*3/uL (ref 4.0–10.5)

## 2015-03-21 LAB — LIPID PANEL
CHOL/HDL RATIO: 4
Cholesterol: 193 mg/dL (ref 0–200)
HDL: 49.7 mg/dL (ref >39.00)
LDL Cholesterol: 120 mg/dL — ABNORMAL HIGH (ref 0–99)
NonHDL: 143.3
TRIGLYCERIDES: 118 mg/dL (ref 0.0–149.0)
VLDL: 23.6 mg/dL (ref 0.0–40.0)

## 2015-03-21 LAB — TSH: TSH: 1.75 u[IU]/mL (ref 0.35–4.50)

## 2015-03-29 ENCOUNTER — Telehealth: Payer: Self-pay

## 2015-03-29 NOTE — Telephone Encounter (Signed)
Patient scheduled for physical on 04/05/15 which is when I typically discuss recent physical lab results. Called patient, no answer, left voicemail. Vallarie Mare, will you send Ms. Tino her results via mail? Also her My Chart is pending. Perhaps we can assist her with getting that set up instead? Thanks.

## 2015-03-29 NOTE — Telephone Encounter (Signed)
Pt left v/m requesting lab results emailed or called to pt for 03/01/15 and 03/21/15 lab results; pt wants to know all lab results for each test including the dip stick of urine done on 03/01/15. Do not see released results from 03/21/15.Please advise.

## 2015-04-02 ENCOUNTER — Encounter (INDEPENDENT_AMBULATORY_CARE_PROVIDER_SITE_OTHER): Payer: Self-pay

## 2015-04-02 ENCOUNTER — Encounter: Payer: Self-pay | Admitting: *Deleted

## 2015-04-02 NOTE — Telephone Encounter (Signed)
Sending letter with recent lab results to patient. Sending info on MyChart (activation code).

## 2015-04-03 ENCOUNTER — Telehealth: Payer: Self-pay | Admitting: Primary Care

## 2015-04-03 NOTE — Telephone Encounter (Signed)
Patient cancelled her appointment for a cpx on 04/05/15.  Patient had her lab work done already.  Patient said she won't be able to reschedule the cpx for another 3 weeks and she'd like to know the results of the lab work.  Patient is asking for a detailed message left on her answering machine.

## 2015-04-03 NOTE — Telephone Encounter (Signed)
Spoke with patient and notified her of results.

## 2015-04-05 ENCOUNTER — Encounter: Payer: Self-pay | Admitting: Primary Care

## 2015-04-29 ENCOUNTER — Encounter: Payer: Self-pay | Admitting: *Deleted

## 2015-07-18 ENCOUNTER — Encounter: Payer: Self-pay | Admitting: Primary Care

## 2015-07-18 ENCOUNTER — Ambulatory Visit (INDEPENDENT_AMBULATORY_CARE_PROVIDER_SITE_OTHER): Payer: 59 | Admitting: Primary Care

## 2015-07-18 ENCOUNTER — Other Ambulatory Visit: Payer: Self-pay | Admitting: Primary Care

## 2015-07-18 VITALS — BP 120/72 | HR 73 | Temp 97.6°F | Ht 67.0 in | Wt 224.1 lb

## 2015-07-18 DIAGNOSIS — E559 Vitamin D deficiency, unspecified: Secondary | ICD-10-CM

## 2015-07-18 DIAGNOSIS — F411 Generalized anxiety disorder: Secondary | ICD-10-CM

## 2015-07-18 DIAGNOSIS — E669 Obesity, unspecified: Secondary | ICD-10-CM

## 2015-07-18 DIAGNOSIS — R739 Hyperglycemia, unspecified: Secondary | ICD-10-CM | POA: Diagnosis not present

## 2015-07-18 DIAGNOSIS — Z0000 Encounter for general adult medical examination without abnormal findings: Secondary | ICD-10-CM

## 2015-07-18 DIAGNOSIS — Z23 Encounter for immunization: Secondary | ICD-10-CM | POA: Diagnosis not present

## 2015-07-18 LAB — HM DEXA SCAN: HM Dexa Scan: NORMAL

## 2015-07-18 LAB — VITAMIN D 25 HYDROXY (VIT D DEFICIENCY, FRACTURES): VITD: 18.51 ng/mL — AB (ref 30.00–100.00)

## 2015-07-18 LAB — HEMOGLOBIN A1C: HEMOGLOBIN A1C: 5.3 % (ref 4.6–6.5)

## 2015-07-18 MED ORDER — ERGOCALCIFEROL 1.25 MG (50000 UT) PO CAPS: 50000.0000 [IU] | ORAL_CAPSULE | ORAL | Status: DC

## 2015-07-18 NOTE — Assessment & Plan Note (Signed)
Tetanus provided today. Due for shingles, she will check with insurance company for coverage. Labs are from May, will repeat vitamin D level and obtain A1C due to hyperglycemia. Exam unremarkable. Pap, mammogram, colonoscopy UTD. Referral made for bone density testing. Discussed importance of healthy lifestyle. Follow up in 1 year for repeat physical.

## 2015-07-18 NOTE — Patient Instructions (Signed)
You've been provided with a tetanus shot today.  Call your insurance company to verify payment for the shingles vaccination.  Complete lab work prior to leaving today. I will notify you of your results.  It is important that you improve your diet. Please limit carbohydrates in the form of white bread, rice, pasta, cakes, cookies, sugary drinks, etc. Increase your consumption of fresh fruits and vegetables. Be sure to drink plenty of water daily.  Start exercising. You need 1 hour of moderate intensity exercise 5 days weekly.  Follow up in 1 year for repeat physical.  It was a pleasure to see you today!

## 2015-07-18 NOTE — Assessment & Plan Note (Signed)
Weight loss of 20 pounds since last visit. She's been working on improving diet. Discussed importance of exercise and continuation of healthy diet.

## 2015-07-18 NOTE — Progress Notes (Signed)
Pre visit review using our clinic review tool, if applicable. No additional management support is needed unless otherwise documented below in the visit note. 

## 2015-07-18 NOTE — Assessment & Plan Note (Signed)
Has not been taking vitamin D regularly. Vitamin D level in May was 18. Will repeat today and restart vitamin D if needed.

## 2015-07-18 NOTE — Assessment & Plan Note (Signed)
Going through family stress, but overall anxiety has reduced. She will notify me if she feels are anxiety is affecting her daily life.

## 2015-07-18 NOTE — Addendum Note (Signed)
Addended by: Jacqualin Combes on: 07/18/2015 04:20 PM   Modules accepted: Orders

## 2015-07-18 NOTE — Progress Notes (Signed)
Subjective:    Patient ID: HAYLO FAKE, female    DOB: [birth year], 64 y.o.   MRN: [id number]  HPI  Ms. Brunke is a 64 year old female who presents today for complete physical. Labs were drawn in May. She had to cancel original physical in May.   Immunizations: -Tetanus: Unsure of last. Believes it's been more than 10 years ago. -Influenza: Never gets flu shot, declines today. -Shingles: Never completed, would like one today, but would like to check with insurance for coverage.  Diet: Endorses a fair diet. Vegeterian. Diet consists of: Breakfast: Protein oatmeal with granola, veggie sausage, coffee recently. Skips. Lunch: Salad, veggie burger, BLT, hot dog with veggie chili Dinner: Eats out sometimes, will try to make healthier choices. [place] food (sesamie tofu with rice). Olive [employer] salad.  Will cook soups. Pasta. Desserts: 2-3 times weekly. Beverages: Water, sweet tea, lemonade, occasional soda Exercise: She does not currently exercise. She will walk sometimes. Eye exam: Completed 2 years ago. Dental exam: Cleaning less than 6 months ago. Colonoscopy: Completed in September 2013. Due again in 2018. Dexa: Never completed. Pap Smear: Completed in December 2015 Mammogram: Completed in April 2016  Wt Readings from Last 3 Encounters:  07/18/15 224 lb 1.9 oz (101.66 kg)  03/01/15 244 lb 1.9 oz (110.732 kg)  04/19/14 215 lb (97.523 kg)   She's not been taking her vitramin D Rx in the last several months.    Review of Systems  HENT: Negative for rhinorrhea.   Respiratory: Negative for cough and shortness of breath.   Cardiovascular: Negative for chest pain.  Gastrointestinal: Negative for diarrhea and constipation.  Genitourinary: Negative for difficulty urinating.  Musculoskeletal: Negative for myalgias and arthralgias.  Skin: Negative for rash.  Neurological: Negative for dizziness and headaches.  Psychiatric/Behavioral:       Some family stress, overall no concerns  for anxiety or depression.       Past Medical History  Diagnosis Date  . Anxiety   . Hyperlipidemia   . Heart murmur   . Fibroids     Uterine    Social History   Social History  . Marital Status: Single    Spouse Name: N/A  . Number of Children: N/A  . Years of Education: N/A   Occupational History  . Not on file.   Social History Main Topics  . Smoking status: Former Smoker -- 20 years  . Smokeless tobacco: Never Used  . Alcohol Use: 0.0 oz/week    0 Standard drinks or equivalent per week     Comment: seldom  . Drug Use: No  . Sexual Activity: Not on file   Other Topics Concern  . Not on file   Social History Narrative    Past Surgical History  Procedure Laterality Date  . Tonsillectomy    . Rotator cuff repair      No family history on file.  No Known Allergies  Current Outpatient Prescriptions on File Prior to Visit  Medication Sig Dispense Refill  . ALPRAZolam (XANAX) 1 MG tablet Take 0.5 mg by mouth 2 (two) times daily as needed for anxiety.    . ergocalciferol (VITAMIN D2) 50000 UNITS capsule Take 1 capsule (50,000 Units total) by mouth once a week. (Patient not taking: Reported on 07/18/2015) 12 capsule 0   No current facility-administered medications on file prior to visit.    BP 120/72 mmHg  Pulse 73  Temp(Src) 97.6 F (36.4 C) (Oral)  Ht 5\' 7"  (1.702 m)  Wt 224 lb 1.9 oz (101.66 kg)  BMI 35.09 kg/m2  SpO2 95%  LMP 04/28/2012    Objective:   Physical Exam  Constitutional: She is oriented to person, place, and time. She appears well-nourished.  HENT:  Right Ear: Tympanic membrane and ear canal normal.  Left Ear: Tympanic membrane and ear canal normal.  Nose: Nose normal.  Mouth/Throat: Oropharynx is clear and moist.  Eyes: Conjunctivae and EOM are normal. Pupils are equal, round, and reactive to light.  Neck: Neck supple. No thyromegaly present.  Cardiovascular: Normal rate and regular rhythm.   Pulmonary/Chest: Effort normal and  breath sounds normal.  Abdominal: Soft. Bowel sounds are normal. There is no tenderness.  Musculoskeletal: Normal range of motion.  Lymphadenopathy:    She has no cervical adenopathy.  Neurological: She is alert and oriented to person, place, and time. She has normal reflexes. No cranial nerve deficit.  Skin: Skin is warm and dry.  Psychiatric: She has a normal mood and affect.          Assessment & Plan:

## 2015-07-19 ENCOUNTER — Encounter: Payer: Self-pay | Admitting: *Deleted

## 2015-07-22 ENCOUNTER — Telehealth: Payer: Self-pay

## 2015-07-22 NOTE — Telephone Encounter (Signed)
Patient called back. Called and notified patient of [name]'s comments. Patient verbalized understanding. Patient then asked about bone density results. Notified patient that we do not have the results yet but will let her know as soon.

## 2015-07-22 NOTE — Telephone Encounter (Signed)
She should hold the calcium with vitamin D until she's completed the prescription strength of the vitamin D. Thanks.

## 2015-07-22 NOTE — Telephone Encounter (Signed)
I left a message for the patient to return my call.

## 2015-07-22 NOTE — Telephone Encounter (Signed)
Pt is currently taking Vit D 50,000 weekly and pt wants to know if should continue taking vit D with Calcium daily while taking the high dose Vit D capsule.. Pt request cb.

## 2015-07-29 ENCOUNTER — Telehealth: Payer: Self-pay | Admitting: Primary Care

## 2015-07-29 NOTE — Telephone Encounter (Signed)
Pt had this done at [facility]

## 2015-07-29 NOTE — Telephone Encounter (Signed)
Pt is calling to find out about her bone density test.  Please call at [phone]. You can also leave a message.   Thanks

## 2015-07-29 NOTE — Telephone Encounter (Signed)
Will someone obtain these records, please?  Thanks!

## 2015-07-29 NOTE — Telephone Encounter (Signed)
I do not see the results in the system and have not received anything through fax. Will you please obtain this information?

## 2015-07-30 ENCOUNTER — Telehealth: Payer: Self-pay | Admitting: Primary Care

## 2015-07-30 NOTE — Telephone Encounter (Signed)
Called and notified patient of [name]'s comments. Patient verbalized understanding.  

## 2015-07-30 NOTE — Telephone Encounter (Signed)
Called [facility] this morning and able to obtain with results by faxed. Gave report to [facility].  Per Anda Kraft. Called and notified patient bone density is normal and continue to take vitamin D supplements. Will recheck in 5 years. Patient verbalized understanding.

## 2015-07-30 NOTE — Telephone Encounter (Signed)
Please notify [name] that we received her Bone Density results and they are normal. She is to continue the vitamin D supplement as prescribed. We will repeat this test in 2-3 years.

## 2016-01-02 ENCOUNTER — Telehealth: Payer: Self-pay

## 2016-01-02 NOTE — Telephone Encounter (Signed)
Pt last TSH was 1.75 on 03/21/15; pt wants to know if there is other testing that can dx thyroid problem; pt having several symptoms; itching all over,hot flashes,eyebrows thinning,joint pain,following strict diet but not losing weight. Pt does not want to schedule appt to be seen because annual exam not due to 07/2016. Pt request cb. [place]

## 2016-01-02 NOTE — Telephone Encounter (Signed)
Needs an office visit for these symptoms.

## 2016-01-03 NOTE — Telephone Encounter (Signed)
Pt scheduled for Monday 01/06/16

## 2016-01-03 NOTE — Telephone Encounter (Signed)
Message left for patient to return my call.  

## 2016-01-06 ENCOUNTER — Ambulatory Visit (INDEPENDENT_AMBULATORY_CARE_PROVIDER_SITE_OTHER): Payer: BLUE CROSS/BLUE SHIELD | Admitting: Primary Care

## 2016-01-06 ENCOUNTER — Other Ambulatory Visit: Payer: Self-pay | Admitting: Primary Care

## 2016-01-06 ENCOUNTER — Encounter: Payer: Self-pay | Admitting: Primary Care

## 2016-01-06 VITALS — BP 122/76 | HR 75 | Temp 98.1°F | Ht 67.0 in | Wt 231.8 lb

## 2016-01-06 DIAGNOSIS — F411 Generalized anxiety disorder: Secondary | ICD-10-CM

## 2016-01-06 DIAGNOSIS — E669 Obesity, unspecified: Secondary | ICD-10-CM | POA: Diagnosis not present

## 2016-01-06 DIAGNOSIS — R002 Palpitations: Secondary | ICD-10-CM | POA: Diagnosis not present

## 2016-01-06 DIAGNOSIS — E559 Vitamin D deficiency, unspecified: Secondary | ICD-10-CM | POA: Diagnosis not present

## 2016-01-06 LAB — TSH: TSH: 0.74 u[IU]/mL (ref 0.35–4.50)

## 2016-01-06 LAB — COMPREHENSIVE METABOLIC PANEL
ALT: 20 U/L (ref 0–35)
AST: 19 U/L (ref 0–37)
Albumin: 4 g/dL (ref 3.5–5.2)
Alkaline Phosphatase: 78 U/L (ref 39–117)
BUN: 11 mg/dL (ref 6–23)
CALCIUM: 9.6 mg/dL (ref 8.4–10.5)
CHLORIDE: 106 meq/L (ref 96–112)
CO2: 27 meq/L (ref 19–32)
Creatinine, Ser: 0.87 mg/dL (ref 0.40–1.20)
GFR: 70.08 mL/min (ref >60.00)
Glucose, Bld: 101 mg/dL — ABNORMAL HIGH (ref 70–99)
POTASSIUM: 4.2 meq/L (ref 3.5–5.1)
Sodium: 140 mEq/L (ref 135–145)
Total Bilirubin: 0.4 mg/dL (ref 0.2–1.2)
Total Protein: 6.9 g/dL (ref 6.0–8.3)

## 2016-01-06 LAB — VITAMIN D 25 HYDROXY (VIT D DEFICIENCY, FRACTURES): VITD: 24.28 ng/mL — AB (ref 30.00–100.00)

## 2016-01-06 LAB — CBC
HCT: 41.1 % (ref 36.0–46.0)
HEMOGLOBIN: 13.8 g/dL (ref 12.0–15.0)
MCHC: 33.5 g/dL (ref 30.0–36.0)
MCV: 89.3 fl (ref 78.0–100.0)
PLATELETS: 288 10*3/uL (ref 150.0–400.0)
RBC: 4.6 Mil/uL (ref 3.87–5.11)
RDW: 12.8 % (ref 11.5–15.5)
WBC: 8.2 10*3/uL (ref 4.0–10.5)

## 2016-01-06 LAB — T4, FREE: Free T4: 0.99 ng/dL (ref 0.60–1.60)

## 2016-01-06 LAB — VITAMIN B12: VITAMIN B 12: 551 pg/mL (ref 211–911)

## 2016-01-06 MED ORDER — FLUOXETINE HCL 20 MG PO TABS: 20.0000 mg | ORAL_TABLET | Freq: Every day | ORAL | Status: DC

## 2016-01-06 NOTE — Patient Instructions (Addendum)
Complete lab work prior to leaving today.   I will be in touch later today or tomorrow.  It was a pleasure to see you today!

## 2016-01-06 NOTE — Progress Notes (Signed)
Pre visit review using our clinic review tool, if applicable. No additional management support is needed unless otherwise documented below in the visit note. 

## 2016-01-06 NOTE — Assessment & Plan Note (Signed)
Currently working on lifestyle changes. Encouraged her to continue.

## 2016-01-06 NOTE — Progress Notes (Signed)
Subjective:    Patient ID: [name], female    DOB: 1951/03/10, 65 y.o.   MRN: [id number]  HPI  [name] is a 65 year old female who presents today with multiple complaints.  1) Itching: Notices mostly after eating sugar. Also endorses dry skin for the past several years.   2) Obesity: Currently participating in a liver clense. She is also working out at [employer]. She's increased her water intake and has been watching her calories. She is worried as she's not losing weight as quickly as she would like.   Wt Readings from Last 3 Encounters:  01/06/16 231 lb 12.8 oz (105.144 kg)  07/18/15 224 lb 1.9 oz (101.66 kg)  03/01/15 244 lb 1.9 oz (110.732 kg)    3) Generalized Anxiety Disorder: History of in the past. Currently managed only on alprazolam 1 mg that she takes sparingly provided by her prior PCP. She will use this occasionally, more so for sleep. GAD 7 score of 7 today. She does endorse stress and anxiety daily. She is interested in medication as this once helped in the past to reduce symptoms. Her other symptoms include hot flashes that occur suddenly daily for the past year. She will also experience intermittent palpitations. She was once evaluated by cardiology 3 years ago per patient. She was placed on "heart medicine" which caused her to feel tired so she stopped taking. TSH normal in May of 2016.    4) Vitamin D Deficiency: Low levels at 18 in September 2016. She's not taken the 50,000 unit capsules as prescribed. She's been taking a 5000 unit capsule that she's been getting from her chiropractor.    Review of Systems  Constitutional: Positive for fatigue.  Respiratory: Negative for shortness of breath.   Cardiovascular: Positive for palpitations. Negative for chest pain.  Genitourinary:       Hot flashes  Skin: Negative for rash.       Itchy, dry skin.  Psychiatric/Behavioral: Negative for suicidal ideas and sleep disturbance. The patient is nervous/anxious.       Past Medical History  Diagnosis Date  . Anxiety   . Hyperlipidemia   . Heart murmur   . Fibroids     Uterine    Social History   Social History  . Marital Status: Single    Spouse Name: N/A  . Number of Children: N/A  . Years of Education: N/A   Occupational History  . Not on file.   Social History Main Topics  . Smoking status: Former Smoker -- 20 years  . Smokeless tobacco: Never Used  . Alcohol Use: 0.0 oz/week    0 Standard drinks or equivalent per week     Comment: seldom  . Drug Use: No  . Sexual Activity: Not on file   Other Topics Concern  . Not on file   Social History Narrative    Past Surgical History  Procedure Laterality Date  . Tonsillectomy    . Rotator cuff repair      No family history on file.  No Known Allergies  No current outpatient prescriptions on file prior to visit.   No current facility-administered medications on file prior to visit.    BP 122/76 mmHg  Pulse 75  Temp(Src) 98.1 F (36.7 C) (Oral)  Ht 5\' 7"  (1.702 m)  Wt 231 lb 12.8 oz (105.144 kg)  BMI 36.30 kg/m2  SpO2 98%  LMP 04/28/2012    Objective:   Physical Exam  Constitutional:  She appears well-nourished.  Cardiovascular: Normal rate, regular rhythm and normal heart sounds.   Pulmonary/Chest: Effort normal and breath sounds normal.  Skin: Skin is warm and dry. No rash noted.  Psychiatric:  Talking very quickly during exam, appears anxious. Difficult to get a word in.          Assessment & Plan:

## 2016-01-06 NOTE — Assessment & Plan Note (Addendum)
GAD 7 score of 7 today, but this doesn't match her HPI and presentation. Suspect her symptoms are related as she is under family stress at home and is speaking very quickly during exam.  Labs obtained to rule out metabolic cause of symptoms, all were negative.  Will start low dose Prozac for symptoms. Patient is to take 1/2 tablet daily for 6 days, then advance to 1 full tablet thereafter. We discussed possible side effects of headache, GI upset, drowsiness, and SI/HI. If thoughts of SI/HI develop, we discussed to present to the emergency immediately. Patient verbalized understanding.   Follow up in 6 weeks for re-eval

## 2016-01-06 NOTE — Assessment & Plan Note (Signed)
Stopped taking RX capsules as she was worried she was taking too much. Currently taking 5000 units from her chiropractor. Vitamin d level today improved, not at goal. Continue 5000 units daily, will recheck in 3-6 months.

## 2016-01-07 ENCOUNTER — Telehealth: Payer: Self-pay

## 2016-01-07 NOTE — Telephone Encounter (Signed)
Pt just started prozac 1/2 pill today; pt was reading information that came with rx and pt noted cannot take ibuprofen with prozac; pt wants to know what meds she cannot take with prozac and can pt drink glass of wine once every week or every 2 weeks. Pt request cb.

## 2016-01-07 NOTE — Telephone Encounter (Signed)
This medication is safe to take with most common over the counter meds. She may take occasional iburpofen with the Prozac, just not long term ingestion of ibuprofen.

## 2016-01-07 NOTE — Telephone Encounter (Signed)
Called and notified patient of [name]'s comments. Patient verbalized understanding.  

## 2016-02-17 ENCOUNTER — Encounter: Payer: Self-pay | Admitting: Primary Care

## 2016-02-17 ENCOUNTER — Ambulatory Visit (INDEPENDENT_AMBULATORY_CARE_PROVIDER_SITE_OTHER): Payer: BLUE CROSS/BLUE SHIELD | Admitting: Primary Care

## 2016-02-17 VITALS — BP 122/78 | HR 77 | Temp 97.8°F | Ht 67.0 in | Wt 227.4 lb

## 2016-02-17 DIAGNOSIS — F419 Anxiety disorder, unspecified: Secondary | ICD-10-CM | POA: Diagnosis not present

## 2016-02-17 MED ORDER — HYDROXYZINE HCL 25 MG PO TABS: 25.0000 mg | ORAL_TABLET | Freq: Every evening | ORAL | Status: DC | PRN

## 2016-02-17 NOTE — Assessment & Plan Note (Signed)
No daily worry, irritability, restlessness. Her presentation today would suggest anxiety as she is flighty with her conversation and fidgety in her seat.  Could be underlying ADHD, but hard to discern.   Failed Prozac treatment. Will have her stop Alprazolam as it causes risk for dependence and switch to hydroxyzine PRN for anxiety or sleep.    She is to notify us if no improvement on Hydroxyzine.

## 2016-02-17 NOTE — Patient Instructions (Signed)
Try the hydroxyzine tablets as needed for sleep and anxiety. Take 1 tablet by mouth 30 minutes prior to bed.   Please e-mail me in several weeks with an update on your sleep.  E-mail me prior to your upcoming trip to [place] so I can send medication for your flights.  You are due for your annual physical in September 2017.  It was a pleasure to see you today!

## 2016-02-17 NOTE — Progress Notes (Signed)
   Subjective:    Patient ID: [name], female    DOB: 1951-09-30, 65 y.o.   MRN: [id number]  HPI  [name] is a 65 year old female who presents today for follow up of anxiety.   She was initiated on Prozac 20 mg in early March 2017 for anxiety symptoms. She stopped taking her Prozac after 6 days as it caused GI upset and made her feel "weird". She was provided with Alprazolam tablets from her prior PCP that she takes PRN at bedtime for sleep. She denies daily worry, irritability, restlessness. She mostly experiences her anxiety at bedtime. She tries to take the medication only as needed at bedtime as some weeks she will need it 2-3 times, and others none at all.  She does not take her medication during waking hours.   She is worried about dependence and long term effects of Alprazolam after discussion today and is open to trying other options for sleep.  She will be traveling via airplane overseas by herself this summer and is fearful of flying. She will need a medication to help calm her nerves. She will notify us as time gets closer.   Review of Systems  Respiratory: Negative for shortness of breath.   Cardiovascular: Negative for chest pain.  Neurological: Negative for dizziness and headaches.  Psychiatric/Behavioral: Positive for sleep disturbance. The patient is not nervous/anxious.        Denies symptoms of depression.       Past Medical History  Diagnosis Date  . Anxiety   . Hyperlipidemia   . Heart murmur   . Fibroids     Uterine     Social History   Social History  . Marital Status: Single    Spouse Name: N/A  . Number of Children: N/A  . Years of Education: N/A   Occupational History  . Not on file.   Social History Main Topics  . Smoking status: Former Smoker -- 20 years  . Smokeless tobacco: Never Used  . Alcohol Use: 0.0 oz/week    0 Standard drinks or equivalent per week     Comment: seldom  . Drug Use: No  . Sexual Activity: Not on file    Other Topics Concern  . Not on file   Social History Narrative    Past Surgical History  Procedure Laterality Date  . Tonsillectomy    . Rotator cuff repair      No family history on file.  No Known Allergies  No current outpatient prescriptions on file prior to visit.   No current facility-administered medications on file prior to visit.    BP 122/78 mmHg  Pulse 77  Temp(Src) 97.8 F (36.6 C) (Oral)  Ht 5\' 7"  (1.702 m)  Wt 227 lb 6.4 oz (103.148 kg)  BMI 35.61 kg/m2  SpO2 97%  LMP 04/28/2012    Objective:   Physical Exam  Constitutional: She appears well-nourished.  Cardiovascular: Normal rate and regular rhythm.   Pulmonary/Chest: Effort normal and breath sounds normal.  Skin: Skin is warm and dry.  Psychiatric:  Appropriate mood, but very flighty and jumping around from topic to topic.           Assessment & Plan:

## 2016-02-17 NOTE — Progress Notes (Signed)
Pre visit review using our clinic review tool, if applicable. No additional management support is needed unless otherwise documented below in the visit note. 

## 2016-03-02 ENCOUNTER — Telehealth: Payer: Self-pay | Admitting: Primary Care

## 2016-03-02 DIAGNOSIS — F419 Anxiety disorder, unspecified: Secondary | ICD-10-CM

## 2016-03-02 MED ORDER — VENLAFAXINE HCL ER 37.5 MG PO CP24: 37.5000 mg | ORAL_CAPSULE | Freq: Every day | ORAL | Status: DC

## 2016-03-02 NOTE — Telephone Encounter (Signed)
-----   Message from Pleas Koch, NP sent at 02/17/2016 12:17 PM EDT ----- Regarding: Hydroxyzine Initiated Hydroxyzine as needed for sleep and anxiety last visit. How's she doing?

## 2016-03-02 NOTE — Telephone Encounter (Signed)
Pt left v/m;Pt request a different med that was previously discussed that would help with anxiety and wt loss;pt wants to wait on hydroxyzine.pt request cb.

## 2016-03-02 NOTE — Telephone Encounter (Signed)
Message left for patient to return my call.  

## 2016-03-02 NOTE — Telephone Encounter (Signed)
Prescription of Effexor (venlafaxine) 37.5 mg sent to pharmacy for anxiety. She is to take 1 capsule by mouth every morning. I will need to see her in the office for follow up (30 minute appointment) in 6 weeks.

## 2016-03-03 NOTE — Telephone Encounter (Signed)
Patient returned Chan's call.  Please call patient back at [phone].

## 2016-03-03 NOTE — Telephone Encounter (Signed)
Called and notified patient of [name]'s comments. Patient verbalized understanding. Patient will call back later to schedule follow up appt.

## 2016-04-20 ENCOUNTER — Telehealth: Payer: Self-pay

## 2016-04-20 DIAGNOSIS — F40243 Fear of flying: Secondary | ICD-10-CM

## 2016-04-20 MED ORDER — DIAZEPAM 5 MG PO TABS: ORAL_TABLET | ORAL | Status: DC

## 2016-04-20 NOTE — Telephone Encounter (Signed)
Pt left v/m; pt is getting ready for trip to [place] and pt request refill of Valium that pt previously discussed with Allie Bossier NP. [facility] [address]. Not on med list.

## 2016-04-20 NOTE — Telephone Encounter (Signed)
Noted. Please call in Valium 5 mg tablets. Take 1 tablet by mouth every 8 hours as needed for flight travel. Take first tablet 30 minutes prior to flight takeoff. Substitution permitted.

## 2016-04-20 NOTE — Telephone Encounter (Signed)
Called in Valium to [facility].

## 2016-04-21 NOTE — Telephone Encounter (Signed)
Spoken to patient and notified her that Rx was called in to [facility]. Patient verbalized understanding.

## 2016-10-22 ENCOUNTER — Encounter: Payer: Self-pay | Admitting: Internal Medicine

## 2016-10-28 ENCOUNTER — Telehealth: Payer: Self-pay

## 2016-10-28 NOTE — Telephone Encounter (Signed)
Have spoke with patient on and off for the past few days. Patient stated she either seen here or [facility] a few years ago stated she had and OV visit,Heart Monitor,and Stress Test. I made patient aware I would look further into finding out which facility she was seen at and call her back.   Called Solmon Ice ( [place]) yesterday she could not locate [facility] chart for patient. I have also looked in our system and could not find where patient has been seen with our physicians here either. I called patient back made her aware I will call Marcie Bal  and see if she can double check the Warehouse for me and see what we can come up with- patient was appreciative for my help.  Spoke with Marcie Bal this am asked to re-check the warehouse for a chart for this patient all she could find was a Financial controller chart I asked her to order all charts she had on this patient so she can make an appointment here with [facility]. Once I have chart I will call patient back.

## 2016-12-11 ENCOUNTER — Ambulatory Visit: Payer: BLUE CROSS/BLUE SHIELD | Admitting: Internal Medicine

## 2016-12-28 ENCOUNTER — Encounter (INDEPENDENT_AMBULATORY_CARE_PROVIDER_SITE_OTHER): Payer: Self-pay

## 2016-12-28 ENCOUNTER — Encounter: Payer: Self-pay | Admitting: Internal Medicine

## 2016-12-28 ENCOUNTER — Ambulatory Visit (INDEPENDENT_AMBULATORY_CARE_PROVIDER_SITE_OTHER): Payer: BLUE CROSS/BLUE SHIELD | Admitting: Internal Medicine

## 2016-12-28 VITALS — BP 124/82 | HR 72 | Ht 66.25 in | Wt 224.0 lb

## 2016-12-28 DIAGNOSIS — R002 Palpitations: Secondary | ICD-10-CM

## 2016-12-28 DIAGNOSIS — I4891 Unspecified atrial fibrillation: Secondary | ICD-10-CM

## 2016-12-28 DIAGNOSIS — E663 Overweight: Secondary | ICD-10-CM | POA: Diagnosis not present

## 2016-12-28 DIAGNOSIS — F419 Anxiety disorder, unspecified: Secondary | ICD-10-CM | POA: Diagnosis not present

## 2016-12-28 NOTE — Patient Instructions (Addendum)
Medication Instructions:  Your physician recommends that you continue on your current medications as directed. Please refer to the Current Medication list given to you today.   Labwork: Your physician recommends that you return for lab work today: TSH, T4, BMET, CBC    Testing/Procedures: Your physician has requested that you have an echocardiogram. Echocardiography is a painless test that uses sound waves to create images of your heart. It provides your doctor with information about the size and shape of your heart and how well your heart's chambers and valves are working. This procedure takes approximately one hour. There are no restrictions for this procedure.     Follow-Up: Your physician wants you to follow-up in: 6 weeks with Roderic Palau, NP at the [facility]    Any Other Special Instructions Will Be Listed Below (If Applicable).     If you need a refill on your cardiac medications before your next appointment, please call your pharmacy.

## 2016-12-28 NOTE — Progress Notes (Signed)
Electrophysiology Office Note   Date:  12/28/2016   ID:  [name], [name] July 07, 1951, MRN [id number]  PCP:  Sheral Flow, NP  Cardiologist:  none Primary Electrophysiologist: Thompson Grayer, MD    CC: palpitations   History of Present Illness: [name] is a 66 y.o. female who presents today for electrophysiology evaluation.   The patient is self referred today.  She reports having nasal surgery in December.  She says that she was told that her heart "raced" during surgery.  She presents with an EKG from that time which reveals sinus rhythm 75 bpm, no arrhythmias.  She notes occasional "heart pounding"' at home.  She has a sensation that her heart is beating fast.  She is primarily concerned about her weight today.  She is worried about losing weight.  She has taken phentermine previously.  She says that anxiety related to weight reduction is the reason that she has occasional fast heart beats.  Today, she denies symptoms of chest pain, shortness of breath, orthopnea, PND, lower extremity edema, claudication, dizziness, presyncope, syncope, bleeding, or neurologic sequela. The patient is tolerating medications without difficulties and is otherwise without complaint today.    Past Medical History:  Diagnosis Date  . Anxiety   . Fibroids    Uterine  . Heart murmur   . Hyperlipidemia   . Overweight    Past Surgical History:  Procedure Laterality Date  . NOSE SURGERY    . ROTATOR CUFF REPAIR    . TONSILLECTOMY     Medicines: none  Allergies:   Patient has no known allergies.   Social History:  The patient  reports that she has quit smoking. Her smoking use included Cigarettes. She quit after 25.00 years of use. She has never used smokeless tobacco. She reports that she drinks alcohol. She reports that she does not use drugs.   Family History:  The patient's brother had afib   ROS:  Please see the history of present illness.   All other systems are reviewed  and negative.    PHYSICAL EXAM: VS:  BP 124/82   Pulse 72   Ht 5' 6.25" (1.683 m)   Wt 224 lb (101.6 kg)   LMP 04/28/2012   BMI 35.88 kg/m  , BMI Body mass index is 35.88 kg/m. GEN: overweight, in no acute distress  HEENT: normal  Neck: no JVD, carotid bruits, or masses Cardiac: RRR; no murmurs, rubs, or gallops,no edema  Respiratory:  clear to auscultation bilaterally, normal work of breathing GI: soft, nontender, nondistended, + BS MS: no deformity or atrophy  Skin: warm and dry  Neuro:  Strength and sensation are intact Psych: euthymic mood, full affect  EKG:  EKG is ordered today. The ekg ordered today shows sinus rhythm 72 bpm, PR 126 msec, QRS 66 msec, Qtc 429 msec, septal infarct pattern   Recent Labs: 01/06/2016: ALT 20; BUN 11; Creatinine, Ser 0.87; Hemoglobin 13.8; Platelets 288.0; Potassium 4.2; Sodium 140; TSH 0.74    Lipid Panel     Component Value Date/Time   CHOL 193 03/21/2015 0819   TRIG 118.0 03/21/2015 0819   HDL 49.70 03/21/2015 0819   CHOLHDL 4 03/21/2015 0819   VLDL 23.6 03/21/2015 0819   LDLCALC 120 (H) 03/21/2015 0819     Wt Readings from Last 3 Encounters:  12/28/16 224 lb (101.6 kg)  02/17/16 227 lb 6.4 oz (103.1 kg)  01/06/16 231 lb 12.8 oz (105.1 kg)      Other  studies Reviewed: Additional studies/ records that were reviewed today include: Dr Whitney Post op note  Review of the above records today demonstrates: per Dr Jones Bales "It should be noted that at the beginning of the procedure she was noted to go into atrial fibrillation, and stayed in atrial fibrillation for much of the procedure."  ASSESSMENT AND PLAN:  1.  Atrial fibrillation The patient presents today for evaluation.  She was recently observed (per report) to have afib during her nasal surgery.  Unfortunately, I do not have strips to review.  She states that she wore an event monitor several years ago but has not been able to obtain this for review.  Today, we talked about  monitoring with implantable loop recorder as an option.   At this time, she would like to continue lifestyle modification and avoid monitoring. Echo, tfts today No medicine changes chads2vasc score I 1 (female).  Per guidelines, I will therefore not initiate anticoagulation at this time. Lifestyle modification encouraged  2. Obesity Body mass index is 35.88 kg/m. Weight loss via regular exercise and diet modification are encouraged I have advised that she should not use stimulants for weight reduction  She will follow-up in 6 weeks in the AF clinic She may benefit from [employer] wellness coaching while there. I have given her a flyer for our nutrition weight loss class today.  She will follow in the AF clinic and I will see as needed going forward  Current medicines are reviewed at length with the patient today.   The patient does not have concerns regarding her medicines.  The following changes were made today:  none  Signed, Thompson Grayer, MD  12/28/2016 12:15 PM     [facility] [address] [place]  [place] [address] [phone] (office) [phone] (fax)

## 2016-12-29 LAB — CBC WITH DIFFERENTIAL/PLATELET
BASOS ABS: 0.1 10*3/uL (ref 0.0–0.2)
Basos: 1 %
EOS (ABSOLUTE): 0.1 10*3/uL (ref 0.0–0.4)
Eos: 1 %
HEMOGLOBIN: 13.5 g/dL (ref 11.1–15.9)
Hematocrit: 40.6 % (ref 34.0–46.6)
Immature Grans (Abs): 0 10*3/uL (ref 0.0–0.1)
Immature Granulocytes: 0 %
LYMPHS ABS: 2.9 10*3/uL (ref 0.7–3.1)
Lymphs: 41 %
MCH: 29.7 pg (ref 26.6–33.0)
MCHC: 33.3 g/dL (ref 31.5–35.7)
MCV: 89 fL (ref 79–97)
MONOCYTES: 6 %
MONOS ABS: 0.4 10*3/uL (ref 0.1–0.9)
NEUTROS ABS: 3.6 10*3/uL (ref 1.4–7.0)
Neutrophils: 51 %
Platelets: 297 10*3/uL (ref 150–379)
RBC: 4.54 x10E6/uL (ref 3.77–5.28)
RDW: 13.1 % (ref 12.3–15.4)
WBC: 7.1 10*3/uL (ref 3.4–10.8)

## 2016-12-29 LAB — BASIC METABOLIC PANEL
BUN / CREAT RATIO: 12 (ref 12–28)
BUN: 10 mg/dL (ref 8–27)
CO2: 23 mmol/L (ref 18–29)
Calcium: 9.4 mg/dL (ref 8.7–10.3)
Chloride: 105 mmol/L (ref 96–106)
Creatinine, Ser: 0.81 mg/dL (ref 0.57–1.00)
GFR calc non Af Amer: 78 mL/min/{1.73_m2} (ref >59)
GFR, EST AFRICAN AMERICAN: 89 mL/min/{1.73_m2} (ref >59)
GLUCOSE: 88 mg/dL (ref 65–99)
POTASSIUM: 4.4 mmol/L (ref 3.5–5.2)
SODIUM: 147 mmol/L — AB (ref 134–144)

## 2016-12-29 LAB — T4: T4 TOTAL: 6.5 ug/dL (ref 4.5–12.0)

## 2016-12-29 LAB — TSH: TSH: 1.85 u[IU]/mL (ref 0.450–4.500)

## 2017-01-13 ENCOUNTER — Other Ambulatory Visit (HOSPITAL_COMMUNITY): Payer: BLUE CROSS/BLUE SHIELD

## 2017-01-22 ENCOUNTER — Other Ambulatory Visit (HOSPITAL_COMMUNITY): Payer: BLUE CROSS/BLUE SHIELD

## 2017-02-08 ENCOUNTER — Ambulatory Visit (HOSPITAL_COMMUNITY): Payer: BLUE CROSS/BLUE SHIELD | Admitting: Nurse Practitioner

## 2017-04-02 LAB — HM MAMMOGRAPHY

## 2017-04-05 ENCOUNTER — Other Ambulatory Visit: Payer: Self-pay | Admitting: Ophthalmology

## 2017-04-26 ENCOUNTER — Ambulatory Visit (INDEPENDENT_AMBULATORY_CARE_PROVIDER_SITE_OTHER): Payer: BLUE CROSS/BLUE SHIELD | Admitting: Family Medicine

## 2017-04-26 ENCOUNTER — Encounter: Payer: Self-pay | Admitting: Family Medicine

## 2017-04-26 VITALS — BP 100/74 | HR 90 | Temp 98.0°F | Ht 66.25 in | Wt 225.3 lb

## 2017-04-26 DIAGNOSIS — J069 Acute upper respiratory infection, unspecified: Secondary | ICD-10-CM

## 2017-04-26 MED ORDER — BENZONATATE 100 MG PO CAPS: 100.0000 mg | ORAL_CAPSULE | Freq: Two times a day (BID) | ORAL | 0 refills | Status: DC | PRN

## 2017-04-26 NOTE — Patient Instructions (Addendum)
INSTRUCTIONS FOR UPPER RESPIRATORY INFECTION:  -plenty of rest and fluids  -nasal saline wash 2-3 times daily (use prepackaged nasal saline or bottled/distilled water if making your own)   -can use AFRIN nasal spray for drainage and nasal congestion - but do NOT use longer then 3-4 days  -can use tylenol (in no history of liver disease) or ibuprofen (if no history of kidney disease, bowel bleeding or significant heart disease) as directed for aches and sorethroat  -if you are taking a cough medication - use only as directed, may also try a teaspoon of honey to coat the throat and throat lozenges. I sent Tessalon to the pharmacy as well.  -for sore throat, salt water gargles can help  -follow up if you have fevers, facial pain, tooth pain, difficulty breathing or are worsening or symptoms persist longer then expected  Upper Respiratory Infection, Adult An upper respiratory infection (URI) is also known as the common cold. It is often caused by a type of germ (virus). Colds are easily spread (contagious). You can pass it to others by kissing, coughing, sneezing, or drinking out of the same glass. Usually, you get better in 1 to 3  weeks.  However, the cough can last for even longer. HOME CARE   Only take medicine as told by your doctor. Follow instructions provided above.  Drink enough water and fluids to keep your pee (urine) clear or pale yellow.  Get plenty of rest.  Return to work when your temperature is < 100 for 24 hours or as told by your doctor. You may use a face mask and wash your hands to stop your cold from spreading. GET HELP RIGHT AWAY IF:   After the first few days, you feel you are getting worse.  You have questions about your medicine.  You have chills, shortness of breath, or red spit (mucus).  You have pain in the face for more then 1-2 days, especially when you bend forward.  You have a fever, puffy (swollen) neck, pain when you swallow, or white spots in the  back of your throat.  You have a bad headache, ear pain, sinus pain, or chest pain.  You have a high-pitched whistling sound when you breathe in and out (wheezing).  You cough up blood.  You have sore muscles or a stiff neck. MAKE SURE YOU:   Understand these instructions.  Will watch your condition.  Will get help right away if you are not doing well or get worse. Document Released: 04/06/2008 Document Revised: 01/11/2012 Document Reviewed: 01/24/2014 [facility] Patient Information 2015 [place], [place]. This information is not intended to replace advice given to you by your health care provider. Make sure you discuss any questions you have with your health care provider.

## 2017-04-26 NOTE — Progress Notes (Signed)
HPI:  Acute visit URI: -started: 4-5 days ago -symptoms:nasal congestion - clear mostly, sneezing, drainage, HA, sub fever initially, cough -denies: wheezing, thick or discolored sputum, SOB, NVD, tooth pain -has tried: tea and honey, cold medication - these helped -sick contacts/travel/risks: no reported flu, strep or tick exposure - boyfriend with similar -denies hx lung disease or immunocompromising condition  ROS: See pertinent positives and negatives per HPI.  Past Medical History:  Diagnosis Date  . Anxiety   . Fibroids    Uterine  . Heart murmur   . Hyperlipidemia   . Overweight     Past Surgical History:  Procedure Laterality Date  . NOSE SURGERY    . ROTATOR CUFF REPAIR    . TONSILLECTOMY      No family history on file.  Social History   Social History  . Marital status: Single    Spouse name: N/A  . Number of children: N/A  . Years of education: N/A   Social History Main Topics  . Smoking status: Former Smoker    Years: 25.00    Types: Cigarettes  . Smokeless tobacco: Never Used     Comment: smoked off and on for about 25 years  . Alcohol use 0.0 oz/week     Comment: seldom  . Drug use: No  . Sexual activity: Not Asked   Other Topics Concern  . None   Social History Narrative   Pt lives in [place] with boyfriend of 21 years.   Retired Web designer     Current Outpatient Prescriptions:  .  benzonatate (TESSALON) 100 MG capsule, Take 1 capsule (100 mg total) by mouth 2 (two) times daily as needed for cough., Disp: 20 capsule, Rfl: 0  EXAM:  Vitals:   04/26/17 1338  BP: 100/74  Pulse: 90  Temp: 98 F (36.7 C)    Body mass index is 36.09 kg/m.  GENERAL: vitals reviewed and listed above, alert, oriented, appears well hydrated and in no acute distress  HEENT: atraumatic, conjunttiva clear, no obvious abnormalities on inspection of external nose and ears, normal appearance of ear canals and TMs, clear nasal congestion,  mild post oropharyngeal erythema with PND, no tonsillar edema or exudate, no sinus TTP  NECK: no obvious masses on inspection  LUNGS: clear to auscultation bilaterally, no wheezes, rales or rhonchi, good air movement  CV: HRRR, no peripheral edema  MS: moves all extremities without noticeable abnormality  PSYCH: pleasant and cooperative, no obvious depression or anxiety  ASSESSMENT AND PLAN:  Discussed the following assessment and plan:  Upper respiratory virus  -given HPI and exam findings today, a serious infection or illness is unlikely. We discussed potential etiologies, with VURI being most likely, and opted for tessalon for cough, supportive care and monitoring. We discussed treatment side effects, likely course, antibiotic misuse, transmission, and signs of developing a serious illness. -of course, we advised to return or notify a doctor immediately if symptoms worsen or persist or new concerns arise.    Patient Instructions  INSTRUCTIONS FOR UPPER RESPIRATORY INFECTION:  -plenty of rest and fluids  -nasal saline wash 2-3 times daily (use prepackaged nasal saline or bottled/distilled water if making your own)   -can use AFRIN nasal spray for drainage and nasal congestion - but do NOT use longer then 3-4 days  -can use tylenol (in no history of liver disease) or ibuprofen (if no history of kidney disease, bowel bleeding or significant heart disease) as directed for aches and sorethroat  -  if you are taking a cough medication - use only as directed, may also try a teaspoon of honey to coat the throat and throat lozenges. I sent Tessalon to the pharmacy as well.  -for sore throat, salt water gargles can help  -follow up if you have fevers, facial pain, tooth pain, difficulty breathing or are worsening or symptoms persist longer then expected  Upper Respiratory Infection, Adult An upper respiratory infection (URI) is also known as the common cold. It is often caused by a  type of germ (virus). Colds are easily spread (contagious). You can pass it to others by kissing, coughing, sneezing, or drinking out of the same glass. Usually, you get better in 1 to 3  weeks.  However, the cough can last for even longer. HOME CARE   Only take medicine as told by your doctor. Follow instructions provided above.  Drink enough water and fluids to keep your pee (urine) clear or pale yellow.  Get plenty of rest.  Return to work when your temperature is < 100 for 24 hours or as told by your doctor. You may use a face mask and wash your hands to stop your cold from spreading. GET HELP RIGHT AWAY IF:   After the first few days, you feel you are getting worse.  You have questions about your medicine.  You have chills, shortness of breath, or red spit (mucus).  You have pain in the face for more then 1-2 days, especially when you bend forward.  You have a fever, puffy (swollen) neck, pain when you swallow, or white spots in the back of your throat.  You have a bad headache, ear pain, sinus pain, or chest pain.  You have a high-pitched whistling sound when you breathe in and out (wheezing).  You cough up blood.  You have sore muscles or a stiff neck. MAKE SURE YOU:   Understand these instructions.  Will watch your condition.  Will get help right away if you are not doing well or get worse. Document Released: 04/06/2008 Document Revised: 01/11/2012 Document Reviewed: 01/24/2014 [facility] Patient Information 2015 [place], [place]. This information is not intended to replace advice given to you by your health care provider. Make sure you discuss any questions you have with your health care provider.    Colin Benton R., DO

## 2017-04-30 ENCOUNTER — Telehealth: Payer: Self-pay | Admitting: Primary Care

## 2017-04-30 NOTE — Telephone Encounter (Signed)
Unfortunately [name] is out of town, and she would need an appt to be seen for further evaluation. Is there anything available for [facility]?

## 2017-04-30 NOTE — Telephone Encounter (Signed)
Patient left voicemail message. Patient stated that she feels worse. She stated that she is now coughing up yellow/green mucus. She is requesting something be sent in from this office. She stated that she was seen by Dr Maudie Mercury at [facility] on 04/26/2017 and was very unhappy.

## 2017-04-30 NOTE — Telephone Encounter (Signed)
[facility] Patient Name: [name] DOB: 05/02/1951 Initial Comment Caller states, she has green and yellow mucus, ear pain, sore throat, eye discharge, previously had a headache. Coughing up mucus. She is using a bag to spit up the mucus. Nurse Assessment Nurse: Dimas Chyle, RN, Dellis Filbert Date/Time Eilene Ghazi Time): 04/30/2017 11:36:50 AM Confirm and document reason for call. If symptomatic, describe symptoms. ---Caller states, she has green and yellow mucus, ear pain, sore throat, eye discharge, previously had a headache. Coughing up mucus. She is using a bag to spit up the mucus. Symptoms started last Friday. No fever now. Eye discharge started 2 days ago. Still having ear pain. Does the patient have any new or worsening symptoms? ---Yes Will a triage be completed? ---Yes Related visit to physician within the last 2 weeks? ---Yes Does the PT have any chronic conditions? (i.e. diabetes, asthma, etc.) ---No Is this a behavioral health or substance abuse call? ---No Guidelines Guideline Title Affirmed Question Affirmed Notes Cough - Acute Productive Earache Eye - Pus or Discharge [1] Eye with yellow/green discharge or eyelashes stick together AND [2] NO PCP standing order to call in antibiotic eye drops Final Disposition User Call PCP within 24 Hours [place], RN, [employer] did not want to be seen at a different location. She was previously seen at [facility] office on Monday and symptoms have not improved. Referrals REFERRED TO PCP OFFICE Disagree/Comply: Comply Disagree/Comply: Comply

## 2017-04-30 NOTE — Telephone Encounter (Signed)
Spoke with patient and offered an appt for tomorrow at the Saturday clinic and she refused. She said "she will wait until Monday but if she gets worse from now and then she will seek urgent care".

## 2017-05-04 ENCOUNTER — Encounter: Payer: Self-pay | Admitting: Family Medicine

## 2017-05-04 ENCOUNTER — Ambulatory Visit (INDEPENDENT_AMBULATORY_CARE_PROVIDER_SITE_OTHER): Payer: BLUE CROSS/BLUE SHIELD | Admitting: Family Medicine

## 2017-05-04 ENCOUNTER — Ambulatory Visit: Payer: BLUE CROSS/BLUE SHIELD | Admitting: Family Medicine

## 2017-05-04 DIAGNOSIS — H66002 Acute suppurative otitis media without spontaneous rupture of ear drum, left ear: Secondary | ICD-10-CM | POA: Diagnosis not present

## 2017-05-04 DIAGNOSIS — H6690 Otitis media, unspecified, unspecified ear: Secondary | ICD-10-CM | POA: Insufficient documentation

## 2017-05-04 MED ORDER — AMOXICILLIN 500 MG PO CAPS
500.0000 mg | ORAL_CAPSULE | Freq: Two times a day (BID) | ORAL | 0 refills | Status: DC
Start: 2017-05-04 — End: 2017-05-04

## 2017-05-04 MED ORDER — AMOXICILLIN 500 MG PO CAPS: 500.0000 mg | ORAL_CAPSULE | Freq: Two times a day (BID) | ORAL | 0 refills | Status: DC

## 2017-05-04 NOTE — Progress Notes (Signed)
  Tommi Rumps, MD Phone: [phone]  [name] is a 66 y.o. female who presents today for same-day visit.  Patient presents for left earache. Did have upper respiratory symptoms with sore throat, cough, sneezing, and eye drainage with matting. Was evaluated at one of our other offices for this and treated for a viral upper respiratory infection. She notes her ears then started to hurt. Notes her URI symptoms have improved for the most part though the left ear pain has been increasing. Notes it feels like an echoing in her ear. Notes it's a dull ache at times and at other times it is slightly worse. Does note some slight decrease in her hearing with this. No fevers or drainage. She's tried over-the-counter flu medications, NyQuil, Aleve, Tylenol, and a cough suppressant.  PMH: Former smoker   ROS see history of present illness  Objective  Physical Exam Vitals:   05/04/17 1117  BP: 116/70  Pulse: 83  Temp: 98.3 F (36.8 C)    BP Readings from Last 3 Encounters:  05/04/17 116/70  04/26/17 100/74  12/28/16 124/82   Wt Readings from Last 3 Encounters:  05/04/17 227 lb 9.6 oz (103.2 kg)  04/26/17 225 lb 4.8 oz (102.2 kg)  12/28/16 224 lb (101.6 kg)    Physical Exam  Constitutional: No distress.  HENT:  Head: Normocephalic and atraumatic.  Mouth/Throat: Oropharynx is clear and moist. No oropharyngeal exudate.  Left TM erythematous and bulging, no light reflex noted, right TM normal  Eyes: Conjunctivae are normal. Pupils are equal, round, and reactive to light.  Neck: Neck supple.  Cardiovascular: Normal rate, regular rhythm and normal heart sounds.   Pulmonary/Chest: Effort normal and breath sounds normal.  Lymphadenopathy:    She has no cervical adenopathy.  Skin: She is not diaphoretic.     Assessment/Plan: Please see individual problem list.  Otitis media Exam most consistent with otitis media. We'll cover with amoxicillin. Discussed over-the-counter  Tylenol for discomfort and if that does not work using ibuprofen or Aleve over-the-counter. If not improving with the antibiotics or she develops fevers she'll let us know.   No orders of the defined types were placed in this encounter.   Meds ordered this encounter  Medications  . DISCONTD: amoxicillin (AMOXIL) 500 MG capsule    Sig: Take 1 capsule (500 mg total) by mouth 2 (two) times daily.    Dispense:  20 capsule    Refill:  0  . amoxicillin (AMOXIL) 500 MG capsule    Sig: Take 1 capsule (500 mg total) by mouth 2 (two) times daily.    Dispense:  20 capsule    Refill:  0   Tommi Rumps, MD [place]

## 2017-05-04 NOTE — Patient Instructions (Signed)
Nice to meet you. It appears you have an ear infection. We'll treat with amoxicillin. You can take Tylenol over-the-counter for any discomfort. If this is not beneficial you can take ibuprofen or Aleve over-the-counter. If you develop fevers or symptoms do not improve please let us know.

## 2017-05-04 NOTE — Assessment & Plan Note (Signed)
Exam most consistent with otitis media. We'll cover with amoxicillin. Discussed over-the-counter Tylenol for discomfort and if that does not work using ibuprofen or Aleve over-the-counter. If not improving with the antibiotics or she develops fevers she'll let us know.

## 2017-05-10 ENCOUNTER — Ambulatory Visit: Payer: BLUE CROSS/BLUE SHIELD | Admitting: Internal Medicine

## 2017-05-10 ENCOUNTER — Ambulatory Visit (INDEPENDENT_AMBULATORY_CARE_PROVIDER_SITE_OTHER): Payer: BLUE CROSS/BLUE SHIELD | Admitting: Internal Medicine

## 2017-05-10 ENCOUNTER — Encounter: Payer: Self-pay | Admitting: Internal Medicine

## 2017-05-10 VITALS — BP 118/80 | HR 88 | Temp 97.7°F | Wt 225.8 lb

## 2017-05-10 DIAGNOSIS — H9202 Otalgia, left ear: Secondary | ICD-10-CM | POA: Diagnosis not present

## 2017-05-10 NOTE — Patient Instructions (Signed)
Barotitis Media Barotitis media is inflammation of the middle ear. This condition occurs when an auditory tube (eustachian tube) is blocked in one or both ears. These tubes lead from the middle ear to the back of the nose (nasopharynx). This condition typically occurs when you experience changes in pressure, such as when flying or scuba diving. Untreated barotitis media may lead to damage or hearing loss (barotrauma), which may become permanent. What are the causes? This condition may be caused by changes in air pressure from:  Flying.  Scuba diving.  A nearby explosion. What increases the risk? The following factors may make you more likely to develop this condition:  Middle ear infection.  Sinus infection.  A cold.  Environmental allergies.  Small eustachian tubes.  Recent ear surgery. What are the signs or symptoms? Symptoms of this condition may include:  Ear pain.  Hearing loss. In severe cases, symptoms can include:  Dizziness and nausea (vertigo).  Temporary facial paralysis. How is this diagnosed? This condition is diagnosed based on:  A physical exam. Your health care provider may:  Use a device (otoscope) to look into your ear canal and check your eardrum.  Do a test that changes air pressure in the middle ear to check how well the eardrum moves and to see if the eustachian tube is working(tympanogram).  Your medical history. In some cases, your health care provider may have you take a hearing test. You may also be referred to someone who specializes in ear treatment (otolaryngologist, "ENT"). How is this treated? This condition may be treated with:  Medicines to relieve congestion in your nose, sinus, or upper respiratory tract (decongestants).  Techniques to equalize pressure (to "pop" your ears), such as:  Yawning.  Chewing gum.  Swallowing. In severe cases, you may need surgery to relieve your symptoms or to prevent future inflammation. Follow  these instructions at home:  Take over-the-counter and prescription medicines only as told by your health care provider.  Do not put anything into your ears to clean or unplug them. Ear drops will not help.  Keep all follow-up visits as told by your health care provider. This is important. How is this prevented? Using these strategies may help to prevent barotitis media:  Chewing gum with frequent, forceful swallowing during takeoff and landing when flying.  Holding your nose and gently blowing to pop your ears for equalizing pressure changes. This forces air into the eustachian tube.  Yawning during air pressure changes.  Using a nasal decongestant about 30-60 minutes before flying, if you have nasal congestion. Contact a health care provider if:  You have vertigo.  You have hearing loss.  Your symptoms do not get better or they get worse.  You have a fever. Get help right away if:  You have a severe headache, ear pain, and dizziness.  You have balance problems.  You cannot move or feel part of your face.  You have bloody or pus-like drainage from your ears. Summary  Barotitis media is inflammation of the middle ear.  This condition typically occurs when you experience changes in pressure, such as when flying or scuba diving.  You may be at a higher risk for this condition if you have small eustachian tubes, had recent ear surgery, or have allergies, a cold, or sinus or middle ear infection.  This condition may be treated with medicines or techniques to equalize pressure in your ears.  Strategies can be used to help prevent barotitis media. This information is   not intended to replace advice given to you by your health care provider. Make sure you discuss any questions you have with your health care provider. Document Released: 10/16/2000 Document Revised: 09/07/2016 Document Reviewed: 09/07/2016 Elsevier Interactive Patient Education  2017 Elsevier Inc.  

## 2017-05-10 NOTE — Progress Notes (Signed)
Subjective:    Patient ID: [name], female    DOB: [birth year], 66 y.o.   MRN: [id number]  HPI  Pt presents to the clinic today with c/o persistent left ear pain and clicking. She reports this started 2 weeks ago. The pain can be sharp and stabbing, but it is mostly achy. She does report associated decreased hearing and fullness. She denies runny nose, nasal congestion or sore throat. She was seen by Dr. Caryl Bis 7/3 for the same, and treated with a 10 days course of Amoxicillin which she still continues to take. She has also tried nasal saline with minimal relief. She had surgery 10/2016 for a deviated nasal septum. She last saw Dr. Evelina Bucy 01/2017- note reviewed.  Review of Systems      Past Medical History:  Diagnosis Date  . Anxiety   . Fibroids    Uterine  . Heart murmur   . Hyperlipidemia   . Overweight     Current Outpatient Prescriptions  Medication Sig Dispense Refill  . amoxicillin (AMOXIL) 500 MG capsule Take 1 capsule (500 mg total) by mouth 2 (two) times daily. 20 capsule 0  . benzonatate (TESSALON) 100 MG capsule Take 1 capsule (100 mg total) by mouth 2 (two) times daily as needed for cough. (Patient not taking: Reported on 05/04/2017) 20 capsule 0   No current facility-administered medications for this visit.     No Known Allergies  No family history on file.  Social History   Social History  . Marital status: Single    Spouse name: N/A  . Number of children: N/A  . Years of education: N/A   Occupational History  . Not on file.   Social History Main Topics  . Smoking status: Former Smoker    Years: 25.00    Types: Cigarettes  . Smokeless tobacco: Never Used     Comment: smoked off and on for about 25 years  . Alcohol use 0.0 oz/week     Comment: seldom  . Drug use: No  . Sexual activity: Not on file   Other Topics Concern  . Not on file   Social History Narrative   Pt lives in [place] with boyfriend of 21 years.   Retired  Web designer     Constitutional: Denies fever, malaise, fatigue, headache or abrupt weight changes.  HEENT: Pt reports left ear pain. Denies eye pain, eye redness, ringing in the ears, wax buildup, runny nose, nasal congestion, bloody nose, or sore throat. Respiratory: Denies difficulty breathing, shortness of breath, cough or sputum production.    No other specific complaints in a complete review of systems (except as listed in HPI above).  Objective:   Physical Exam   BP 118/80   Pulse 88   Temp 97.7 F (36.5 C) (Oral)   Wt 225 lb 12 oz (102.4 kg)   LMP 04/28/2012   SpO2 97%   BMI 36.16 kg/m  Wt Readings from Last 3 Encounters:  05/10/17 225 lb 12 oz (102.4 kg)  05/04/17 227 lb 9.6 oz (103.2 kg)  04/26/17 225 lb 4.8 oz (102.2 kg)    General: Appears her stated age, well developed, well nourished in NAD. HEENT: Right Ear: Tm's gray and intact, normal light reflex; Left Ear: TM flat, cloudy. No obvious effusion noted. Throat/Mouth: Teeth present, mucosa pink and moist, no exudate, lesions or ulcerations noted.  Neck:  No adenopathy noted.  BMET    Component Value Date/Time   NA 147 (H) 12/28/2016  1320   K 4.4 12/28/2016 1320   CL 105 12/28/2016 1320   CO2 23 12/28/2016 1320   GLUCOSE 88 12/28/2016 1320   GLUCOSE 101 (H) 01/06/2016 1002   BUN 10 12/28/2016 1320   CREATININE 0.81 12/28/2016 1320   CALCIUM 9.4 12/28/2016 1320   GFRNONAA 78 12/28/2016 1320   GFRAA 89 12/28/2016 1320    Lipid Panel     Component Value Date/Time   CHOL 193 03/21/2015 0819   TRIG 118.0 03/21/2015 0819   HDL 49.70 03/21/2015 0819   CHOLHDL 4 03/21/2015 0819   VLDL 23.6 03/21/2015 0819   LDLCALC 120 (H) 03/21/2015 0819    CBC    Component Value Date/Time   WBC 7.1 12/28/2016 1320   WBC 8.2 01/06/2016 1002   RBC 4.54 12/28/2016 1320   RBC 4.60 01/06/2016 1002   HGB 13.5 12/28/2016 1320   HCT 40.6 12/28/2016 1320   PLT 297 12/28/2016 1320   MCV 89 12/28/2016 1320    MCH 29.7 12/28/2016 1320   MCHC 33.3 12/28/2016 1320   MCHC 33.5 01/06/2016 1002   RDW 13.1 12/28/2016 1320   LYMPHSABS 2.9 12/28/2016 1320   MONOABS 0.4 03/21/2015 0819   EOSABS 0.1 12/28/2016 1320   BASOSABS 0.1 12/28/2016 1320    Hgb A1C Lab Results  Component Value Date   HGBA1C 5.3 07/18/2015           Assessment & Plan:   Otalgia, Left:  Advised her continue Amoxil until completed Start Flonase BID x 1 week If no improvement, will refer to ENT for further evaluation  Return precautions discussed Webb Silversmith, NP

## 2017-05-17 ENCOUNTER — Telehealth: Payer: Self-pay

## 2017-05-17 DIAGNOSIS — H9202 Otalgia, left ear: Secondary | ICD-10-CM

## 2017-05-17 NOTE — Telephone Encounter (Signed)
Pt left v/m; pt was seen at 04/26/17, 05/04/17 and 05/10/17 with problem with lt ear; pt has taken abx,pt does not have any discharge from ear but pt cannot hear well out of that ear; pt said something is not right with the left ear. At 05/10/17 visit R Baity NP (who is not here today) would do referral to ENT if continued with symptoms. Pt request to talk with Allie Bossier NP.Please advise.

## 2017-05-17 NOTE — Telephone Encounter (Signed)
Noted, referral placed.  

## 2017-05-17 NOTE — Telephone Encounter (Signed)
Please notify patient that I'm sorry to hear about her continued symptoms. I'm happy to place a referral to ENT for further evaluation. Is she agreeable?

## 2017-05-17 NOTE — Telephone Encounter (Signed)
Spoken and notified patient of [name]'s comments. Patient verbalized understanding.  Patient is agreeable to the referral to ENT.

## 2017-07-14 ENCOUNTER — Telehealth (INDEPENDENT_AMBULATORY_CARE_PROVIDER_SITE_OTHER): Payer: Self-pay | Admitting: Orthopaedic Surgery

## 2017-07-14 NOTE — Telephone Encounter (Signed)
Dr Lorie Apley office called requesting most recent labs. Faxed [facility] [phone]

## 2017-09-15 ENCOUNTER — Telehealth: Payer: Self-pay

## 2017-09-15 DIAGNOSIS — Z0000 Encounter for general adult medical examination without abnormal findings: Secondary | ICD-10-CM

## 2017-09-15 NOTE — Telephone Encounter (Signed)
Copied from [place] [id number]. Topic: Appointment Scheduling - Scheduling Inquiry for Clinic >> Sep 15, 2017  2:04 PM Arletha Grippe wrote: Reason for CRM:pt would like to have chloride tested, It was 112 before, she would also like Vit D tested and test to see if she has diabetes. As well as normal cpe labs. She is coming in tomorrow for labs for her cpe

## 2017-09-15 NOTE — Telephone Encounter (Signed)
Noted. These are all included in traditional CPE labs. Labs have been ordered.

## 2017-09-16 ENCOUNTER — Other Ambulatory Visit (INDEPENDENT_AMBULATORY_CARE_PROVIDER_SITE_OTHER): Payer: BLUE CROSS/BLUE SHIELD

## 2017-09-16 DIAGNOSIS — Z0000 Encounter for general adult medical examination without abnormal findings: Secondary | ICD-10-CM

## 2017-09-16 LAB — COMPREHENSIVE METABOLIC PANEL
ALT: 13 U/L (ref 0–35)
AST: 16 U/L (ref 0–37)
Albumin: 4.1 g/dL (ref 3.5–5.2)
Alkaline Phosphatase: 75 U/L (ref 39–117)
BILIRUBIN TOTAL: 0.5 mg/dL (ref 0.2–1.2)
BUN: 15 mg/dL (ref 6–23)
CO2: 28 mEq/L (ref 19–32)
Calcium: 9.4 mg/dL (ref 8.4–10.5)
Chloride: 107 mEq/L (ref 96–112)
Creatinine, Ser: 0.86 mg/dL (ref 0.40–1.20)
GFR: 70.64 mL/min (ref >60.00)
GLUCOSE: 100 mg/dL — AB (ref 70–99)
POTASSIUM: 4.5 meq/L (ref 3.5–5.1)
Sodium: 141 mEq/L (ref 135–145)
Total Protein: 7 g/dL (ref 6.0–8.3)

## 2017-09-16 LAB — LIPID PANEL
CHOL/HDL RATIO: 4
Cholesterol: 197 mg/dL (ref 0–200)
HDL: 52.4 mg/dL (ref >39.00)
LDL Cholesterol: 117 mg/dL — ABNORMAL HIGH (ref 0–99)
NONHDL: 144.56
Triglycerides: 136 mg/dL (ref 0.0–149.0)
VLDL: 27.2 mg/dL (ref 0.0–40.0)

## 2017-09-16 LAB — VITAMIN D 25 HYDROXY (VIT D DEFICIENCY, FRACTURES): VITD: 23.85 ng/mL — AB (ref 30.00–100.00)

## 2017-09-16 LAB — HEMOGLOBIN A1C: Hgb A1c MFr Bld: 5.4 % (ref 4.6–6.5)

## 2017-09-20 ENCOUNTER — Telehealth: Payer: Self-pay | Admitting: Primary Care

## 2017-09-20 NOTE — Telephone Encounter (Signed)
Spoke with patient about lab results. Thanked her for rescheduling.

## 2017-09-20 NOTE — Telephone Encounter (Signed)
I moved pt's appt to a time that worked with her schedule, 11/30. She is requesting a call back this week with her lab results.

## 2017-09-22 ENCOUNTER — Encounter: Payer: BLUE CROSS/BLUE SHIELD | Admitting: Primary Care

## 2017-10-01 ENCOUNTER — Other Ambulatory Visit (HOSPITAL_COMMUNITY)
Admission: RE | Admit: 2017-10-01 | Discharge: 2017-10-01 | Disposition: A | Discharge status: Home / Self Care | Payer: BLUE CROSS/BLUE SHIELD | Source: Ambulatory Visit | Attending: Primary Care | Admitting: Primary Care

## 2017-10-01 ENCOUNTER — Ambulatory Visit (INDEPENDENT_AMBULATORY_CARE_PROVIDER_SITE_OTHER): Payer: BLUE CROSS/BLUE SHIELD | Admitting: Primary Care

## 2017-10-01 ENCOUNTER — Encounter: Payer: Self-pay | Admitting: Primary Care

## 2017-10-01 VITALS — BP 118/70 | HR 65 | Temp 98.7°F | Ht 66.25 in | Wt 228.4 lb

## 2017-10-01 DIAGNOSIS — Z124 Encounter for screening for malignant neoplasm of cervix: Secondary | ICD-10-CM

## 2017-10-01 DIAGNOSIS — Z0000 Encounter for general adult medical examination without abnormal findings: Secondary | ICD-10-CM | POA: Diagnosis not present

## 2017-10-01 DIAGNOSIS — I480 Paroxysmal atrial fibrillation: Secondary | ICD-10-CM | POA: Diagnosis not present

## 2017-10-01 DIAGNOSIS — E669 Obesity, unspecified: Secondary | ICD-10-CM

## 2017-10-01 DIAGNOSIS — E559 Vitamin D deficiency, unspecified: Secondary | ICD-10-CM | POA: Diagnosis not present

## 2017-10-01 NOTE — Assessment & Plan Note (Signed)
Diagnosed recently through cardiology.  Now managed on metoprolol succinate and aspirin once daily. Continue cardiology follow up.

## 2017-10-01 NOTE — Progress Notes (Signed)
Subjective:    Patient ID: [name], female    DOB: 1951/01/22, 66 y.o.   MRN: [id number]  HPI  [name] is a 66 year old female who presents today for complete physical.  Immunizations: -Tetanus: Completed in 2016 -Influenza: Declines -Shingles: Completed in 2017  Diet: She endorses a fair diet. Breakfast: Oatmeal with peanut butter powder. Lunch: Raw vegetables, sometimes fried veggies, beans, salads Dinner: Pizza, salad, veggie hot dogs Snacks: Apple and peanut butter Desserts: Previously eating ice cream and chocolate, now has cut back. Beverages: Coffee, water, hot tea  Exercise: She does not currently exercise, plans on exercising. Eye exam: Due next week. Dental exam: Completes annually Colonoscopy: Completed in 2013, working to get this scheduled.  Dexa: Completed in 2016, negative Pap Smear:  Due.  Mammogram: Completed in 2018, negative    Review of Systems  Constitutional: Negative for unexpected weight change.  HENT: Negative for rhinorrhea.   Respiratory: Negative for cough and shortness of breath.   Cardiovascular: Positive for palpitations. Negative for chest pain.       Intermittent palpitations. Following with cardiology  Gastrointestinal: Negative for constipation and diarrhea.  Genitourinary: Negative for difficulty urinating and menstrual problem.  Musculoskeletal: Negative for arthralgias and myalgias.  Skin: Negative for rash.  Allergic/Immunologic: Negative for environmental allergies.  Neurological: Negative for dizziness, numbness and headaches.  Psychiatric/Behavioral:       Denies concerns for depression      Past Medical History:  Diagnosis Date  . Anxiety   . Fibroids    Uterine  . Heart murmur   . Hyperlipidemia   . Overweight      Social History   Socioeconomic History  . Marital status: Single    Spouse name: Not on file  . Number of children: Not on file  . Years of education: Not on file  . Highest education  level: Not on file  Social Needs  . Financial resource strain: Not on file  . Food insecurity - worry: Not on file  . Food insecurity - inability: Not on file  . Transportation needs - medical: Not on file  . Transportation needs - non-medical: Not on file  Occupational History  . Not on file  Tobacco Use  . Smoking status: Former Smoker    Years: 25.00    Types: Cigarettes  . Smokeless tobacco: Never Used  . Tobacco comment: smoked off and on for about 25 years  Substance and Sexual Activity  . Alcohol use: Yes    Alcohol/week: 0.0 oz    Comment: seldom  . Drug use: No  . Sexual activity: Not on file  Other Topics Concern  . Not on file  Social History Narrative   Pt lives in [place] with boyfriend of 21 years.   Retired Web designer    Past Surgical History:  Procedure Laterality Date  . NOSE SURGERY    . ROTATOR CUFF REPAIR    . TONSILLECTOMY      No family history on file.  No Known Allergies  Current Outpatient Medications on File Prior to Visit  Medication Sig Dispense Refill  . aspirin EC 81 MG tablet Take 81 mg by mouth daily.    . metoprolol succinate (TOPROL-XL) 25 MG 24 hr tablet metoprolol succinate ER 25 mg tablet,extended release 24 hr     No current facility-administered medications on file prior to visit.     BP 118/70   Pulse 65   Temp 98.7 F (37.1  C) (Oral)   Ht 5' 6.25" (1.683 m)   Wt 228 lb 6.4 oz (103.6 kg)   LMP 04/28/2012   SpO2 97%   BMI 36.59 kg/m    Objective:   Physical Exam  Constitutional: She is oriented to person, place, and time. She appears well-nourished.  HENT:  Right Ear: Tympanic membrane and ear canal normal.  Left Ear: Tympanic membrane and ear canal normal.  Nose: Nose normal.  Mouth/Throat: Oropharynx is clear and moist.  Eyes: Conjunctivae and EOM are normal. Pupils are equal, round, and reactive to light.  Neck: Neck supple. No thyromegaly present.  Cardiovascular: Normal rate and regular  rhythm.  No murmur heard. Pulmonary/Chest: Effort normal and breath sounds normal. She has no rales.  Abdominal: Soft. Bowel sounds are normal. There is no tenderness.  Genitourinary: There is no tenderness or lesion on the right labia. There is no tenderness on the left labia. Cervix exhibits discharge. Cervix exhibits no motion tenderness. Right adnexum displays no tenderness. Left adnexum displays no tenderness. No vaginal discharge found.  Genitourinary Comments: Mild whitish vaginal discharge. No foul smell.  Musculoskeletal: Normal range of motion.  Lymphadenopathy:    She has no cervical adenopathy.  Neurological: She is alert and oriented to person, place, and time. She has normal reflexes. No cranial nerve deficit.  Skin: Skin is warm and dry. No rash noted.  Psychiatric: She has a normal mood and affect.          Assessment & Plan:

## 2017-10-01 NOTE — Addendum Note (Signed)
Addended by: Jacqualin Combes on: 10/01/2017 12:21 PM   Modules accepted: Orders

## 2017-10-01 NOTE — Assessment & Plan Note (Signed)
Td and Zostavax UTD. Declines influenza vaccination. Discussed the importance of a healthy diet and regular exercise in order for weight loss, and to reduce the risk of other medical problems. Colonoscopy due, currently scheduling.  Mammogram UTD. Pap due, completed today. Exam unremarkable.  Labs stable. Follow up in 1 year

## 2017-10-01 NOTE — Assessment & Plan Note (Signed)
Discussed the importance of a healthy diet and regular exercise in order for weight loss, and to reduce the risk of other medical problems.  

## 2017-10-01 NOTE — Assessment & Plan Note (Signed)
Low on recent labs, taking vitamin D 2000 units daily irregularly for months. Now taking more frequently. Will repeat in 3 months.

## 2017-10-01 NOTE — Patient Instructions (Addendum)
Start exercising. You should be getting 150 minutes of moderate intensity exercise weekly.  Continue to work on a healthy diet.  Ensure you are consuming 64 ounces of water daily.  Schedule a lab only appointment in 3 months to recheck your vitamin D. Continue 2000 units once daily.  Follow up in 1 year for your annual exam or sooner if needed.  It was a pleasure to see you today!

## 2017-10-05 LAB — CYTOLOGY - PAP
ADEQUACY: ABSENT
DIAGNOSIS: NEGATIVE
HPV (WINDOPATH): NOT DETECTED

## 2017-10-06 ENCOUNTER — Encounter: Payer: Self-pay | Admitting: *Deleted

## 2017-11-19 ENCOUNTER — Ambulatory Visit: Payer: BLUE CROSS/BLUE SHIELD | Admitting: Internal Medicine

## 2017-12-09 ENCOUNTER — Telehealth: Payer: Self-pay | Admitting: Primary Care

## 2017-12-09 DIAGNOSIS — F419 Anxiety disorder, unspecified: Secondary | ICD-10-CM

## 2017-12-09 NOTE — Telephone Encounter (Signed)
Please notify patient that I'm more than willing to help treat her anxiety, but I don't support Xanax use for daily symptoms of anxiety. I'm happy to discuss her concerns during a 30 minute office visit as there are other options for anxiety treatment that we can explore.

## 2017-12-09 NOTE — Telephone Encounter (Signed)
Spoke to pt and advised per Vallarie Mare, she will need an OV to restart Xanax. Med is not on her current medication list and last Rx 01/2016. Offered an appt; pt declined

## 2017-12-09 NOTE — Telephone Encounter (Signed)
Attempted to call patient- left message that in order to fill this medication an appointment may be required. I will forward the request- but she may get a call back to schedule an appointment.  Xanax request- patient has history of use  LOV: 10/01/17  Last filled: 01/28/16  Pharmacy: [facility]

## 2017-12-09 NOTE — Telephone Encounter (Signed)
Copied from [place]. Topic: Quick Communication - See Telephone Encounter >> Dec 09, 2017  9:35 AM [name] wrote: CRM for notification. See Telephone encounter for:   [name] is asking for prescription for generic xanex, she has had this before. She says she takes just when needed for anxiety.  She wanted today, I informed her can take up to 3 business days.  [facility] [facility] [place] - [place] [address] Phone: [phone] Fax: [phone]   12/09/17.

## 2017-12-09 NOTE — Telephone Encounter (Signed)
Unable to reach pt by phone.

## 2017-12-09 NOTE — Telephone Encounter (Signed)
Pt states she missed a call from [place] and would like her to call her back when she has a moment

## 2017-12-09 NOTE — Telephone Encounter (Signed)
Per CRM: Modena Nunnery, [place] 12/09/2017 10:37 AM See previous phone note. Pt wanting to restart Xanax without OV

## 2017-12-09 NOTE — Telephone Encounter (Signed)
Copied from [place] [phone]. Topic: General - Other >> Dec 09, 2017 10:31 AM Lolita Rieger, RMA wrote: Reason for CRM: pt called and wanted to speak to Allie Bossier and not her assistant  Please contact [phone]

## 2017-12-10 MED ORDER — HYDROXYZINE HCL 10 MG PO TABS
ORAL_TABLET | ORAL | 0 refills | Status: DC
Start: 2017-12-10 — End: 2018-10-03

## 2017-12-10 NOTE — Telephone Encounter (Signed)
Please notify patient that I"m sorry to hear about her insurance and also about her anxiety.  I'm not going to prescribe Xanax but am willing to provide her with another option called hydroxyzine. This is similar to Xanax but not addictive and safer. Let me know if she's willing and i'll send it to her pharmacy.

## 2017-12-10 NOTE — Telephone Encounter (Signed)
Noted, Rx sent to pharmacy. 

## 2017-12-10 NOTE — Telephone Encounter (Signed)
Message left for patient to return my call.  

## 2017-12-10 NOTE — Telephone Encounter (Signed)
Pt called back and notified as instructed; pt is willing to try Hydroxyzine and request sent to [facility] [address]. Pt will ck with pharmacy late today.

## 2017-12-10 NOTE — Telephone Encounter (Signed)
Spoken and notified patient of [name]'s comments. Patient stated she does not have insurance right and will not until September when she turn 19.   Patient stated there are issues going and wanted ask [name] to just refill the Xanax this one time. She stated that will not be taking it every day.

## 2017-12-31 ENCOUNTER — Other Ambulatory Visit: Payer: BLUE CROSS/BLUE SHIELD

## 2018-04-09 ENCOUNTER — Ambulatory Visit (INDEPENDENT_AMBULATORY_CARE_PROVIDER_SITE_OTHER): Payer: Self-pay

## 2018-04-09 ENCOUNTER — Encounter (HOSPITAL_COMMUNITY): Payer: Self-pay

## 2018-04-09 ENCOUNTER — Ambulatory Visit (HOSPITAL_COMMUNITY)
Admission: EM | Admit: 2018-04-09 | Discharge: 2018-04-09 | Disposition: A | Discharge status: Home / Self Care | Payer: Self-pay | Attending: Internal Medicine | Admitting: Internal Medicine

## 2018-04-09 DIAGNOSIS — M1711 Unilateral primary osteoarthritis, right knee: Secondary | ICD-10-CM

## 2018-04-09 NOTE — ED Triage Notes (Signed)
Pt presents with complaints of right knee pain x 3 weeks. Denies any injury. Reports doing some exercises but denies any trauma. She has been seen by a chiropractor without any relief.

## 2018-04-09 NOTE — ED Provider Notes (Signed)
[place]    CSN: [id number] Arrival date & time: 04/09/18  1539     History   Chief Complaint No chief complaint on file.   HPI [name] is a 67 y.o. female.   Patient comes in today complaining of right knee pain for 3 weeks. Onset was sudden. Pain associated with stiffness. Pain most prominent when she gets up from sitting position. Patient denies any injury. She have been seeing a chiropractor without any relief. Haven't taken anything for pain relief.      Past Medical History:  Diagnosis Date  . Anxiety   . Fibroids    Uterine  . Heart murmur   . Hyperlipidemia   . Overweight     Patient Active Problem List   Diagnosis Date Noted  . Paroxysmal atrial fibrillation ([place]) 10/01/2017  . Preventative health care 07/18/2015  . Vitamin D deficiency 03/01/2015  . Obesity (BMI 35.0-39.9 without comorbidity) 03/01/2015  . Anxiety 03/01/2015    Past Surgical History:  Procedure Laterality Date  . NOSE SURGERY    . ROTATOR CUFF REPAIR    . TONSILLECTOMY      OB History   None      Home Medications    Prior to Admission medications   Medication Sig Start Date End Date Taking? Authorizing Provider  aspirin EC 81 MG tablet Take 81 mg by mouth daily.    [provider]  hydrOXYzine (ATARAX/VISTARIL) 10 MG tablet Take 1 to 2 tablets by mouth up to twice daily as needed for anxiety. May cause drowsiness. 12/10/17   Pleas Koch, NP  metoprolol succinate (TOPROL-XL) 25 MG 24 hr tablet metoprolol succinate ER 25 mg tablet,extended release 24 hr    [provider]    Family History History reviewed. No pertinent family history.  Social History Social History   Tobacco Use  . Smoking status: Former Smoker    Years: 25.00    Types: Cigarettes  . Smokeless tobacco: Never Used  . Tobacco comment: smoked off and on for about 25 years  Substance Use Topics  . Alcohol use: Yes    Alcohol/week: 0.0 oz    Comment: seldom    . Drug use: No     Allergies   Patient has no known allergies.   Review of Systems Review of Systems  Constitutional:       See HPI     Physical Exam Triage Vital Signs ED Triage Vitals  Enc Vitals Group     BP 04/09/18 1612 134/68     Pulse Rate 04/09/18 1612 64     Resp 04/09/18 1612 18     Temp 04/09/18 1612 98 F (36.7 C)     Temp src --      SpO2 04/09/18 1612 100 %     Weight --      Height --      Head Circumference --      Peak Flow --      Pain Score 04/09/18 1611 8     Pain Loc --      Pain Edu? --      Excl. in [place]? --    No data found.  Updated Vital Signs BP 134/68   Pulse 64   Temp 98 F (36.7 C)   Resp 18   LMP 04/28/2012   SpO2 100%   Physical Exam  Constitutional: She is oriented to person, place, and time. She appears well-developed and well-nourished.  Cardiovascular: Normal rate, regular rhythm and normal heart sounds.  Pulmonary/Chest: Effort normal and breath sounds normal. She has no wheezes.  Musculoskeletal:  Right knee slightly swollen. Tender to palpate on the medial side. Has full ROM. Ambulatory. No crepitus.   Neurological: She is alert and oriented to person, place, and time.  Nursing note and vitals reviewed.    UC Treatments / Results  Labs (all labs ordered are listed, but only abnormal results are displayed) Labs Reviewed - No data to display  EKG None  Radiology Dg Knee Complete 4 Views Right  Result Date: 04/09/2018 CLINICAL DATA:  Status post fall sometime back with tick bite. Unable to bear weight. EXAM: RIGHT KNEE - COMPLETE 4+ VIEW COMPARISON:  None. FINDINGS: No evidence of fracture, dislocation, or joint effusion. Minimal tibial spine osteophytosis is noted. Soft tissues are unremarkable. IMPRESSION: No acute fracture or dislocation. Minimal degenerative joint changes. Electronically Signed   By: Abelardo Diesel M.D.   On: 04/09/2018 18:04    Procedures Procedures (including critical care  time)  Medications Ordered in UC Medications - No data to display  Initial Impression / Assessment and Plan / UC Course  I have reviewed the triage vital signs and the nursing notes.  Pertinent labs & imaging results that were available during my care of the patient were reviewed by me and considered in my medical decision making (see chart for details).  Final Clinical Impressions(s) / UC Diagnoses   Final diagnoses:  Primary osteoarthritis of right knee   Discussed xray result and treatment with patient. Patient agreed to use NSAID or Tylenol for pain relief. Agreed to f/u with PCP or orthopedic specialist if pain gets worse.  Discharge Instructions   None    ED Prescriptions    None     Controlled Substance Prescriptions [place] Controlled Substance Registry consulted? Not Applicable   Barry Dienes, NP 04/09/18 [phone]

## 2018-04-14 ENCOUNTER — Ambulatory Visit (INDEPENDENT_AMBULATORY_CARE_PROVIDER_SITE_OTHER): Payer: Self-pay

## 2018-04-14 ENCOUNTER — Ambulatory Visit (INDEPENDENT_AMBULATORY_CARE_PROVIDER_SITE_OTHER): Payer: Self-pay | Admitting: Physician Assistant

## 2018-04-14 ENCOUNTER — Encounter (INDEPENDENT_AMBULATORY_CARE_PROVIDER_SITE_OTHER): Payer: Self-pay | Admitting: Physician Assistant

## 2018-04-14 DIAGNOSIS — M25561 Pain in right knee: Secondary | ICD-10-CM

## 2018-04-14 MED ORDER — LIDOCAINE HCL 1 % IJ SOLN
5.0000 mL | INTRAMUSCULAR | Status: AC | PRN
  Administered 2018-04-14: 5 mL

## 2018-04-14 MED ORDER — METHYLPREDNISOLONE ACETATE 40 MG/ML IJ SUSP
40.0000 mg | INTRAMUSCULAR | Status: AC | PRN
  Administered 2018-04-14: 40 mg via INTRA_ARTICULAR

## 2018-04-14 NOTE — Progress Notes (Signed)
Office Visit Note   Patient: [name]           Date of Birth: 1951-05-11           MRN: [id number] Visit Date: 04/14/2018              Requested by: Pleas Koch, NP [place], [place] [address] PCP: Pleas Koch, NP   Assessment & Plan: Visit Diagnoses:  1. Acute pain of right knee     Plan: Asked her to stop the ibuprofen due to GI upset.  She can take some Tylenol for pain.  Encouraged her to continue to work on icing.  She shown quad strengthening exercises.  We will see her back in 2 weeks check her progress lack of.  Due to the fact that she does not have insurance if she is having no pain in the knee she can call and cancel the appointment.  Questions were encouraged and answered.  Follow-Up Instructions: Return in about 2 weeks (around 04/28/2018).   Orders:  Orders Placed This Encounter  Procedures  . Large Joint Inj  . XR Knee 1-2 Views Right   No orders of the defined types were placed in this encounter.     Procedures: Large Joint Inj: R knee on 04/14/2018 3:24 PM Indications: pain Details: 22 G 1.5 in needle, anterolateral approach  Arthrogram: No  Medications: 40 mg methylPREDNISolone acetate 40 MG/ML; 5 mL lidocaine 1 % Aspirate: 12 mL yellow Outcome: tolerated well, no immediate complications Procedure, treatment alternatives, risks and benefits explained, specific risks discussed. Consent was given by the patient. Immediately prior to procedure a time out was called to verify the correct patient, procedure, equipment, support staff and site/side marked as required. Patient was prepped and draped in the usual sterile fashion.       Clinical Data: No additional findings.   Subjective: Chief Complaint  Patient presents with  . Right Knee - Pain    HPI  [name] is a 67 year old female comes in today for acute right knee pain is been ongoing for at least 3 weeks.  No known injury.  She was doing some stretching  exercises and that the only thing that she can think of that may have caused her to develop knee pain.  She is seen in the ER where radiographs were obtained these were supine.  There is no acute fractures no significant arthritic changes.  Knee was well located.  She comes in today due to continued pain medial aspect of the right knee with weightbearing and ambulation.  She states she has tenderness along medial joint line region.  She is tried ice heat elevation ibuprofen with no relief.  She has been getting some GI upset with the ibuprofen. Review of Systems Please see HPI otherwise negative  Objective: Vital Signs: LMP 04/28/2012   Physical Exam  Constitutional: She is oriented to person, place, and time. She appears well-developed and well-nourished. No distress.  Pulmonary/Chest: Effort normal.  Neurological: She is alert and oriented to person, place, and time.  Skin: She is not diaphoretic.  Psychiatric: She has a normal mood and affect.    Ortho Exam Right knee fluid range of motion.  Tenderness along medial joint line.  No abnormal warmth, erythema or ecchymosis.  Positive slight effusion.  McMurray's is negative.  Valgus varus stressing reveals no laxity.  She walks with out any assistive device, with an antalgic gait. Specialty Comments:  No  specialty comments available.  Imaging: Xr Knee 1-2 Views Right  Result Date: 04/14/2018 AP and lateral standing films: Mild narrowing of the medial joint line.  Otherwise knee is well preserved.  Knee is well located.  No acute fractures no bony abnormalities otherwise.    PMFS History: Patient Active Problem List   Diagnosis Date Noted  . Paroxysmal atrial fibrillation ([place]) 10/01/2017  . Preventative health care 07/18/2015  . Vitamin D deficiency 03/01/2015  . Obesity (BMI 35.0-39.9 without comorbidity) 03/01/2015  . Anxiety 03/01/2015   Past Medical History:  Diagnosis Date  . Anxiety   . Fibroids    Uterine  . Heart  murmur   . Hyperlipidemia   . Overweight     No family history on file.  Past Surgical History:  Procedure Laterality Date  . NOSE SURGERY    . ROTATOR CUFF REPAIR    . TONSILLECTOMY     Social History   Occupational History  . Not on file  Tobacco Use  . Smoking status: Former Smoker    Years: 25.00    Types: Cigarettes  . Smokeless tobacco: Never Used  . Tobacco comment: smoked off and on for about 25 years  Substance and Sexual Activity  . Alcohol use: Yes    Alcohol/week: 0.0 oz    Comment: seldom  . Drug use: No  . Sexual activity: Not on file

## 2018-05-12 ENCOUNTER — Telehealth (INDEPENDENT_AMBULATORY_CARE_PROVIDER_SITE_OTHER): Payer: Self-pay | Admitting: Physician Assistant

## 2018-05-12 NOTE — Telephone Encounter (Signed)
See below

## 2018-05-12 NOTE — Telephone Encounter (Signed)
Sorry to send this when you are away but she did see the patient and can give advice on what the next steps will be for her.  Likely just anti-inflammatories.  Apparently she does not have health insurance.  Certainly an MRI can be obtained if she is having that severe pain to assess for any type of internal derangement of the knee.

## 2018-05-12 NOTE — Telephone Encounter (Signed)
Patient called advised the cortisone injection has worn off and she is in a lot of pain. Patient asked for a call back to see what else she can do concerning the pain she is in. Patient asked how much damage she could be doing by walking around on her right knee. The number to contact patient is [phone]

## 2018-05-13 NOTE — Telephone Encounter (Signed)
LMOM of the below message for patient, told her to call back and let us know if she would be interested in an MRI at maybe [facility] where they do payment plans

## 2018-09-26 ENCOUNTER — Telehealth: Payer: Self-pay | Admitting: Primary Care

## 2018-09-26 NOTE — Telephone Encounter (Signed)
Called to see if Pt can come in at 12 instead of 2:20 on 12/2

## 2018-09-27 ENCOUNTER — Other Ambulatory Visit: Payer: Self-pay | Admitting: Primary Care

## 2018-09-27 DIAGNOSIS — Z0000 Encounter for general adult medical examination without abnormal findings: Secondary | ICD-10-CM

## 2018-09-27 DIAGNOSIS — E559 Vitamin D deficiency, unspecified: Secondary | ICD-10-CM

## 2018-10-03 ENCOUNTER — Ambulatory Visit (INDEPENDENT_AMBULATORY_CARE_PROVIDER_SITE_OTHER): Payer: Medicare HMO | Admitting: Primary Care

## 2018-10-03 VITALS — BP 146/84 | HR 77 | Temp 98.5°F | Ht 66.5 in | Wt 231.8 lb

## 2018-10-03 DIAGNOSIS — Z0001 Encounter for general adult medical examination with abnormal findings: Secondary | ICD-10-CM | POA: Diagnosis not present

## 2018-10-03 DIAGNOSIS — I10 Essential (primary) hypertension: Secondary | ICD-10-CM

## 2018-10-03 DIAGNOSIS — I480 Paroxysmal atrial fibrillation: Secondary | ICD-10-CM

## 2018-10-03 DIAGNOSIS — L218 Other seborrheic dermatitis: Secondary | ICD-10-CM | POA: Diagnosis not present

## 2018-10-03 DIAGNOSIS — Z23 Encounter for immunization: Secondary | ICD-10-CM

## 2018-10-03 DIAGNOSIS — L821 Other seborrheic keratosis: Secondary | ICD-10-CM | POA: Diagnosis not present

## 2018-10-03 DIAGNOSIS — E2839 Other primary ovarian failure: Secondary | ICD-10-CM | POA: Diagnosis not present

## 2018-10-03 DIAGNOSIS — Z1159 Encounter for screening for other viral diseases: Secondary | ICD-10-CM

## 2018-10-03 DIAGNOSIS — R5383 Other fatigue: Secondary | ICD-10-CM | POA: Diagnosis not present

## 2018-10-03 DIAGNOSIS — M25569 Pain in unspecified knee: Secondary | ICD-10-CM

## 2018-10-03 DIAGNOSIS — G8929 Other chronic pain: Secondary | ICD-10-CM

## 2018-10-03 DIAGNOSIS — Z1211 Encounter for screening for malignant neoplasm of colon: Secondary | ICD-10-CM

## 2018-10-03 DIAGNOSIS — Z1239 Encounter for other screening for malignant neoplasm of breast: Secondary | ICD-10-CM

## 2018-10-03 DIAGNOSIS — E669 Obesity, unspecified: Secondary | ICD-10-CM

## 2018-10-03 DIAGNOSIS — E559 Vitamin D deficiency, unspecified: Secondary | ICD-10-CM | POA: Diagnosis not present

## 2018-10-03 DIAGNOSIS — M25561 Pain in right knee: Secondary | ICD-10-CM

## 2018-10-03 DIAGNOSIS — Z0000 Encounter for general adult medical examination without abnormal findings: Secondary | ICD-10-CM

## 2018-10-03 DIAGNOSIS — L570 Actinic keratosis: Secondary | ICD-10-CM | POA: Diagnosis not present

## 2018-10-03 DIAGNOSIS — F419 Anxiety disorder, unspecified: Secondary | ICD-10-CM | POA: Diagnosis not present

## 2018-10-03 HISTORY — DX: Essential (primary) hypertension: I10

## 2018-10-03 NOTE — Assessment & Plan Note (Signed)
Mostly stressed with weight gain. Provided encouragement.

## 2018-10-03 NOTE — Assessment & Plan Note (Signed)
Since June 2019 after a fall. Did see orthopedist at that time and received cortisone injections with improvement. She will schedule an appointment with orthopedics.

## 2018-10-03 NOTE — Patient Instructions (Addendum)
Return for your labs as scheduled.  Call the [facility] to schedule your bone density and mammogram.  Continue exercising. You should be getting 150 minutes of moderate intensity exercise weekly.  It's important to improve your diet by reducing consumption of fast food, fried food, processed snack foods, sugary drinks. Increase consumption of fresh vegetables and fruits, whole grains, water.  Ensure you are drinking 64 ounces of water daily.  Follow up with the cardiologist as scheduled.   You will be contacted regarding your referral to GI for the colonoscopy.  Please let us know if you have not been contacted within one week.   Start monitoring your blood pressure daily, around the same time of day, for the next several weeks.  Ensure that you have rested for 30 minutes prior to checking your blood pressure. Record your readings and notify me of you continue to see readings at or above 140/90.  Please return in early 2020 to have your pneumonia vaccination, sign up at the front desk.   It was a pleasure to see you today!

## 2018-10-03 NOTE — Assessment & Plan Note (Signed)
Chronic over the last year, thinks this has a lot to do with stress and weight gain. Check labs today including vitamin B12, vitamin D, TSH, CBC.

## 2018-10-03 NOTE — Assessment & Plan Note (Addendum)
Above goal in the office today, also with elevated readings at [facility]. She is working on lifestyle changes and would like some time to lower her BP that way.  She recently purchased a BP cuff and will start checking. She will notify me if readings are at or above 140/90 consistently.

## 2018-10-03 NOTE — Progress Notes (Signed)
Patient ID: [name], female   DOB: 07-27-1951, 67 y.o.   MRN: [id number]  [name] is a 67 year old female who presents today for her Welcome to Medicare Visit. She's also reporting not feeling well in general including fatigue.   HPI:  Past Medical History:  Diagnosis Date  . Anxiety   . Fibroids    Uterine  . Heart murmur   . Hyperlipidemia   . Overweight     No current outpatient medications on file.   No current facility-administered medications for this visit.     No Known Allergies  No family history on file.  Social History   Socioeconomic History  . Marital status: Single    Spouse name: Not on file  . Number of children: Not on file  . Years of education: Not on file  . Highest education level: Not on file  Occupational History  . Not on file  Social Needs  . Financial resource strain: Not on file  . Food insecurity:    Worry: Not on file    Inability: Not on file  . Transportation needs:    Medical: Not on file    Non-medical: Not on file  Tobacco Use  . Smoking status: Former Smoker    Years: 25.00    Types: Cigarettes  . Smokeless tobacco: Never Used  . Tobacco comment: smoked off and on for about 25 years  Substance and Sexual Activity  . Alcohol use: Yes    Alcohol/week: 0.0 standard drinks    Comment: seldom  . Drug use: No  . Sexual activity: Not on file  Lifestyle  . Physical activity:    Days per week: Not on file    Minutes per session: Not on file  . Stress: Not on file  Relationships  . Social connections:    Talks on phone: Not on file    Gets together: Not on file    Attends religious service: Not on file    Active member of club or organization: Not on file    Attends meetings of clubs or organizations: Not on file    Relationship status: Not on file  . Intimate partner violence:    Fear of current or ex partner: Not on file    Emotionally abused: Not on file    Physically abused: Not on file    Forced sexual  activity: Not on file  Other Topics Concern  . Not on file  Social History Narrative   Pt lives in [place] with boyfriend of 21 years.   Retired Web designer    Hospitiliaztions: None  Health Maintenance:    Flu: Due  Tetanus: Completed in 2016  Pneumovax: Never completed  Prevnar: Never completed   Zostavax: Completed in 2017  Bone Density: Completed in 2016, normal   Colonoscopy: Completed in 2013, due to have one this year.   Eye Doctor: Scheduled for later this month  Dental Exam: Due this week  Mammogram: Completed in June 2018  Pap: Completed in 2018  Hep C Screening: Due    Providers: Dr. Katy Fitch, optometry; Alma Friendly, PCP; Dr. Rayann Heman, cardiology; Dr. Ronnald Ramp, dermatology   I have personally reviewed and have noted: 1. The patient's medical and social history 2. Their use of alcohol, tobacco or illicit drugs 3. Their current medications and supplements 4. The patient's functional ability including ADL's, fall risks, home safety risks  and hearing or visual impairment. 5. Diet and physical activities 6. Evidence for  depression or mood disorder  Subjective:   Review of Systems:   Constitutional: Denies fever, malaise, headache or abrupt weight changes.  HEENT: Denies eye pain, eye redness, ear pain, ringing in the ears, wax buildup, runny nose, nasal congestion, bloody nose, or sore throat. Respiratory: Denies difficulty breathing, shortness of breath, cough or sputum production.  Chronic mucous production/post nasal drip.  Cardiovascular: Denies chest pain, chest tightness, or swelling in the hands or feet. Intermittent palpitations. Will be seeing cardiology next week.  Gastrointestinal: Denies abdominal pain, bloating, constipation, diarrhea or blood in the stool.  GU: Denies urgency, pain with urination, burning sensation, blood in urine, odor or discharge. Nocturia, sometimes getting up 7 times nightly. Does have some water before bed, last  dose of caffeine is in the morning with coffee.  Musculoskeletal: Decrease in range of motion to right knee with some difficulty with gait. Following with orthopedics.  Skin: Working with dermatology who removed 2 precancerous lesions.  Neurological: Denies dizziness recently, difficulty with memory, difficulty with speech or problems with balance and coordination.  Psychiatric: Stress daily, is watching her 40 month old grandson.   No other specific complaints in a complete review of systems (except as listed in HPI above).  Objective:  PE:   BP (!) 146/84   Pulse 77   Temp 98.5 F (36.9 C) (Oral)   Ht 5' 6.5" (1.689 m)   Wt 231 lb 12 oz (105.1 kg)   LMP 04/28/2012   SpO2 97%   BMI 36.85 kg/m  Wt Readings from Last 3 Encounters:  10/03/18 231 lb 12 oz (105.1 kg)  10/01/17 228 lb 6.4 oz (103.6 kg)  05/10/17 225 lb 12 oz (102.4 kg)    General: Appears their stated age, well developed, well nourished in NAD. Skin: Warm, dry and intact. No rashes, lesions or ulcerations noted. HEENT: Head: normal shape and size; Eyes: sclera white, no icterus, conjunctiva pink, PERRLA and EOMs intact; Ears: Tm's gray and intact, normal light reflex; Nose: mucosa pink and moist, septum midline; Throat/Mouth: Teeth present, mucosa pink and moist, no exudate, lesions or ulcerations noted.  Neck: Normal range of motion. Neck supple, trachea midline. No massses, lumps or thyromegaly present.  Cardiovascular: Normal rate and rhythm. S1,S2 noted.  No murmur, rubs or gallops noted. No JVD or BLE edema. No carotid bruits noted. Pulmonary/Chest: Normal effort and positive vesicular breath sounds. No respiratory distress. No wheezes, rales or ronchi noted.  Abdomen: Soft and nontender. Normal bowel sounds, no bruits noted. No distention or masses noted. Liver, spleen and kidneys non palpable. Musculoskeletal: Normal range of motion. No signs of joint swelling. No difficulty with gait.  Neurological: Alert and  oriented. Cranial nerves II-XII intact. Coordination normal. +DTRs bilaterally. Psychiatric: Mood and affect normal. Behavior is normal. Judgment and thought content normal.   EKG: NSR with rate of 73. No t-wave inversion except for V1 and AVR. No ST elevation, atrial fibrillation, PAC/PVC. Grossly unchanged when compared to prior in 2018.  BMET    Component Value Date/Time   NA 141 09/16/2017 0850   NA 147 (H) 12/28/2016 1320   K 4.5 09/16/2017 0850   CL 107 09/16/2017 0850   CO2 28 09/16/2017 0850   GLUCOSE 100 (H) 09/16/2017 0850   BUN 15 09/16/2017 0850   BUN 10 12/28/2016 1320   CREATININE 0.86 09/16/2017 0850   CALCIUM 9.4 09/16/2017 0850   GFRNONAA 78 12/28/2016 1320   GFRAA 89 12/28/2016 1320    Lipid Panel  Component Value Date/Time   CHOL 197 09/16/2017 0850   TRIG 136.0 09/16/2017 0850   HDL 52.40 09/16/2017 0850   CHOLHDL 4 09/16/2017 0850   VLDL 27.2 09/16/2017 0850   LDLCALC 117 (H) 09/16/2017 0850    CBC    Component Value Date/Time   WBC 7.1 12/28/2016 1320   WBC 8.2 01/06/2016 1002   RBC 4.54 12/28/2016 1320   RBC 4.60 01/06/2016 1002   HGB 13.5 12/28/2016 1320   HCT 40.6 12/28/2016 1320   PLT 297 12/28/2016 1320   MCV 89 12/28/2016 1320   MCH 29.7 12/28/2016 1320   MCHC 33.3 12/28/2016 1320   MCHC 33.5 01/06/2016 1002   RDW 13.1 12/28/2016 1320   LYMPHSABS 2.9 12/28/2016 1320   MONOABS 0.4 03/21/2015 0819   EOSABS 0.1 12/28/2016 1320   BASOSABS 0.1 12/28/2016 1320    Hgb A1C Lab Results  Component Value Date   HGBA1C 5.4 09/16/2017      Assessment and Plan:   Medicare Annual Wellness Visit:  Diet: Started a new diet yesterday, prior to yesterday her diet consisted of: Breakfast: Oatmeal, bagel with cream cheese Lunch: Salad with tofu or beans and veggies, veggie hotdogs with chili and slaw, pasta.  Dinner: Building services engineer, take out food, pizza  Snacks: Candy, sweets, chips  Desserts: Chocolate, pecan pies, ice cream. Daily.   Beverages: Water, sweet tea, soda Physical activity: She recently started exercising Depression/mood screen: Negative Hearing: Intact to whispered voice Visual acuity: Grossly normal, performs annual eye exam  ADLs: Capable Fall risk: None Home safety: Good Cognitive evaluation: Intact to orientation, naming, recall and repetition EOL planning: Adv directives packet provided   Preventative Medicine: Immunizations UTD. Mammogram due and pending. Colonoscopy due and pending. Pap smear UTD. Discussed the importance of a healthy diet and regular exercise in order for weight loss, and to reduce the risk of any potential medical problems. Commended her on her plan to start improving her diet and to get regular exercise. Exam unremarkable. Labs pending.    Next appointment: One year.

## 2018-10-03 NOTE — Assessment & Plan Note (Signed)
Gradual weight gain over the years. Encouraged her to continue to work on her diet, get regular exercise.

## 2018-10-03 NOTE — Assessment & Plan Note (Signed)
Immunizations UTD. Mammogram due and pending. Colonoscopy due and pending. Pap smear UTD. Discussed the importance of a healthy diet and regular exercise in order for weight loss, and to reduce the risk of any potential medical problems. Commended her on her plan to start improving her diet and to get regular exercise. Exam unremarkable. Labs pending.   I have personally reviewed and have noted: 1. The patient's medical and social history 2. Their use of alcohol, tobacco or illicit drugs 3. Their current medications and supplements 4. The patient's functional ability including ADL's, fall  risks, home safety risks and hearing or visual  impairment. 5. Diet and physical activities 6. Evidence for depression or mood disorder

## 2018-10-03 NOTE — Assessment & Plan Note (Addendum)
No longer taking metoprolol or Eliquis as prescribed by cardiology. She has an appointment with Dr. Rayann Heman scheduled for December 16th. ECG today with NSR.

## 2018-10-03 NOTE — Assessment & Plan Note (Signed)
Repeat vitamin D pending. 

## 2018-10-04 ENCOUNTER — Other Ambulatory Visit: Payer: Self-pay | Admitting: Ophthalmology

## 2018-10-04 DIAGNOSIS — H25812 Combined forms of age-related cataract, left eye: Secondary | ICD-10-CM | POA: Diagnosis not present

## 2018-10-04 DIAGNOSIS — D23111 Other benign neoplasm of skin of right upper eyelid, including canthus: Secondary | ICD-10-CM | POA: Diagnosis not present

## 2018-10-04 DIAGNOSIS — H2511 Age-related nuclear cataract, right eye: Secondary | ICD-10-CM | POA: Diagnosis not present

## 2018-10-04 DIAGNOSIS — L820 Inflamed seborrheic keratosis: Secondary | ICD-10-CM | POA: Diagnosis not present

## 2018-10-07 ENCOUNTER — Other Ambulatory Visit (INDEPENDENT_AMBULATORY_CARE_PROVIDER_SITE_OTHER): Payer: Medicare HMO

## 2018-10-07 DIAGNOSIS — E559 Vitamin D deficiency, unspecified: Secondary | ICD-10-CM | POA: Diagnosis not present

## 2018-10-07 DIAGNOSIS — Z1159 Encounter for screening for other viral diseases: Secondary | ICD-10-CM

## 2018-10-07 DIAGNOSIS — R5383 Other fatigue: Secondary | ICD-10-CM

## 2018-10-07 DIAGNOSIS — Z0000 Encounter for general adult medical examination without abnormal findings: Secondary | ICD-10-CM

## 2018-10-07 LAB — COMPREHENSIVE METABOLIC PANEL
ALT: 14 U/L (ref 0–35)
AST: 14 U/L (ref 0–37)
Albumin: 4.3 g/dL (ref 3.5–5.2)
Alkaline Phosphatase: 70 U/L (ref 39–117)
BUN: 24 mg/dL — ABNORMAL HIGH (ref 6–23)
CALCIUM: 9.5 mg/dL (ref 8.4–10.5)
CO2: 25 mEq/L (ref 19–32)
Chloride: 105 mEq/L (ref 96–112)
Creatinine, Ser: 0.9 mg/dL (ref 0.40–1.20)
GFR: 66.8 mL/min (ref >60.00)
Glucose, Bld: 94 mg/dL (ref 70–99)
Potassium: 4.2 mEq/L (ref 3.5–5.1)
Sodium: 141 mEq/L (ref 135–145)
Total Bilirubin: 0.5 mg/dL (ref 0.2–1.2)
Total Protein: 7.4 g/dL (ref 6.0–8.3)

## 2018-10-07 LAB — LIPID PANEL
CHOLESTEROL: 240 mg/dL — AB (ref 0–200)
HDL: 51.1 mg/dL (ref >39.00)
LDL Cholesterol: 159 mg/dL — ABNORMAL HIGH (ref 0–99)
NonHDL: 189.24
TRIGLYCERIDES: 151 mg/dL — AB (ref 0.0–149.0)
Total CHOL/HDL Ratio: 5
VLDL: 30.2 mg/dL (ref 0.0–40.0)

## 2018-10-07 LAB — VITAMIN D 25 HYDROXY (VIT D DEFICIENCY, FRACTURES): VITD: 23.55 ng/mL — ABNORMAL LOW (ref 30.00–100.00)

## 2018-10-07 LAB — VITAMIN B12: Vitamin B-12: 538 pg/mL (ref 211–911)

## 2018-10-07 LAB — TSH: TSH: 2.01 u[IU]/mL (ref 0.35–4.50)

## 2018-10-07 LAB — HEMOGLOBIN A1C: Hgb A1c MFr Bld: 5.5 % (ref 4.6–6.5)

## 2018-10-08 LAB — HEPATITIS C ANTIBODY
Hepatitis C Ab: NONREACTIVE
SIGNAL TO CUT-OFF: 0.01 (ref <1.00)

## 2018-10-09 ENCOUNTER — Other Ambulatory Visit: Payer: Self-pay | Admitting: Primary Care

## 2018-10-09 DIAGNOSIS — E782 Mixed hyperlipidemia: Secondary | ICD-10-CM | POA: Insufficient documentation

## 2018-10-09 NOTE — Assessment & Plan Note (Signed)
LDL and Trigs above goal on fasting labs. Strongly advised she work on weight loss through diet and exercise, repeat in 6 months.

## 2018-10-10 ENCOUNTER — Encounter (INDEPENDENT_AMBULATORY_CARE_PROVIDER_SITE_OTHER): Payer: Self-pay | Admitting: Physician Assistant

## 2018-10-10 ENCOUNTER — Ambulatory Visit (INDEPENDENT_AMBULATORY_CARE_PROVIDER_SITE_OTHER): Payer: Medicare HMO | Admitting: Physician Assistant

## 2018-10-10 ENCOUNTER — Telehealth (INDEPENDENT_AMBULATORY_CARE_PROVIDER_SITE_OTHER): Payer: Self-pay

## 2018-10-10 ENCOUNTER — Telehealth: Payer: Self-pay | Admitting: Gastroenterology

## 2018-10-10 DIAGNOSIS — M1711 Unilateral primary osteoarthritis, right knee: Secondary | ICD-10-CM

## 2018-10-10 MED ORDER — METHYLPREDNISOLONE ACETATE 40 MG/ML IJ SUSP
40.0000 mg | INTRAMUSCULAR | Status: AC | PRN
  Administered 2018-10-10: 40 mg via INTRA_ARTICULAR

## 2018-10-10 MED ORDER — LIDOCAINE HCL 1 % IJ SOLN
3.0000 mL | INTRAMUSCULAR | Status: AC | PRN
  Administered 2018-10-10: 3 mL

## 2018-10-10 NOTE — Telephone Encounter (Signed)
Right knee gel injection  

## 2018-10-10 NOTE — Progress Notes (Signed)
Office Visit Note   Patient: [name]           Date of Birth: 10/01/1951           MRN: [id number] Visit Date: 10/10/2018              Requested by: Pleas Koch, NP [place], [place] [address] PCP: Pleas Koch, NP   Assessment & Plan: Visit Diagnoses:  1. Unilateral primary osteoarthritis, right knee     Plan: She will continue to work on strengthening of the right knee, weight loss, and begin taking glucosamine chondroitin.  We will try to gain approval for a supplemental injection for her knee have her follow-up with [place] once this is available.  Follow-Up Instructions: Return for Supplemental injection.   Orders:  No orders of the defined types were placed in this encounter.  No orders of the defined types were placed in this encounter.     Procedures: Large Joint Inj: R knee on 10/10/2018 9:53 AM Indications: pain Details: 22 G 1.5 in needle, superolateral approach  Arthrogram: No  Medications: 3 mL lidocaine 1 %; 40 mg methylPREDNISolone acetate 40 MG/ML Aspirate: 13 mL yellow Outcome: tolerated well, no immediate complications Procedure, treatment alternatives, risks and benefits explained, specific risks discussed. Consent was given by the patient. Immediately prior to procedure a time out was called to verify the correct patient, procedure, equipment, support staff and site/side marked as required. Patient was prepped and draped in the usual sterile fashion.       Clinical Data: No additional findings.   Subjective: Chief Complaint  Patient presents with  . Right Knee - Pain, Follow-up    HPI Splaying comes in today due to right knee pain she was last seen 04/14/2018 and given a cortisone injection which helped for about 3 weeks.  Is been working on strengthening of the knee and also weight loss she  has lost 13 pounds so far.  She did have some laser treatments to her knee which did help some.  She is now having right knee pain that awakens her most her pain is in medial aspect the knee knee does feel weak like it may give way on steps.  Weightbearing radiographs in the office last visit showed narrowing of the medial compartment right knee. Review of Systems Please see HPI  Objective: Vital Signs: LMP 04/28/2012   Physical Exam General: Well-developed well-nourished female in no acute distress. Manson Passey Exam Bilateral knees: Right knee slight effusion no abnormal warmth erythema.  Left knee no effusion.  No instability valgus varus stressing either knee.  Good range of motion both knees.  Patellofemoral crepitus right knee.  No tenderness along medial lateral joint line of either knee Specialty Comments:  No specialty comments available.  Imaging: No results found.   PMFS History: Patient Active Problem List   Diagnosis Date Noted  . Mixed hyperlipidemia 10/09/2018  . Chronic knee pain 10/03/2018  . Essential hypertension 10/03/2018  . Fatigue 10/03/2018  . Welcome to Medicare preventive visit 10/03/2018  . Paroxysmal atrial fibrillation ([place]) 10/01/2017  . Preventative  health care 07/18/2015  . Vitamin D deficiency 03/01/2015  . Obesity (BMI 35.0-39.9 without comorbidity) 03/01/2015  . Anxiety 03/01/2015   Past Medical History:  Diagnosis Date  . Anxiety   . Fibroids    Uterine  . Heart murmur   . Hyperlipidemia   . Overweight     History reviewed. No pertinent family history.  Past Surgical History:  Procedure Laterality Date  . NOSE SURGERY    . ROTATOR CUFF REPAIR    . TONSILLECTOMY     Social History   Occupational History  . Not on file  Tobacco Use  . Smoking status: Former Smoker    Years: 25.00    Types: Cigarettes  . Smokeless tobacco: Never Used  . Tobacco comment: smoked off and on for about 25 years  Substance and Sexual Activity  .  Alcohol use: Yes    Alcohol/week: 0.0 standard drinks    Comment: seldom  . Drug use: No  . Sexual activity: Not on file

## 2018-10-10 NOTE — Telephone Encounter (Signed)
If we can get the records of the polyp pathology from 2013 that would be most ideal. Otherwise I am OK with proceeding with colonoscopy for history of colon polyps which would be a 5-year procedure now, but ideally if we can see the pathology that would be great. OK for direct to procedure unless patient wants to meet/discuss. Thanks. GM

## 2018-10-12 ENCOUNTER — Encounter: Payer: Self-pay | Admitting: Gastroenterology

## 2018-10-13 NOTE — Telephone Encounter (Signed)
Noted  

## 2018-10-14 ENCOUNTER — Encounter: Payer: Self-pay | Admitting: Internal Medicine

## 2018-10-14 ENCOUNTER — Encounter: Payer: Self-pay | Admitting: Primary Care

## 2018-10-17 ENCOUNTER — Encounter: Payer: Self-pay | Admitting: Internal Medicine

## 2018-10-17 ENCOUNTER — Ambulatory Visit: Payer: Medicare HMO | Admitting: Internal Medicine

## 2018-10-17 VITALS — BP 106/70 | HR 135 | Ht 66.0 in | Wt 227.0 lb

## 2018-10-17 DIAGNOSIS — E663 Overweight: Secondary | ICD-10-CM | POA: Diagnosis not present

## 2018-10-17 DIAGNOSIS — R0683 Snoring: Secondary | ICD-10-CM | POA: Diagnosis not present

## 2018-10-17 DIAGNOSIS — I4891 Unspecified atrial fibrillation: Secondary | ICD-10-CM | POA: Diagnosis not present

## 2018-10-17 MED ORDER — DILTIAZEM HCL ER COATED BEADS 120 MG PO CP24: 120.0000 mg | ORAL_CAPSULE | Freq: Every day | ORAL | 3 refills | Status: DC

## 2018-10-17 NOTE — Progress Notes (Signed)
   PCP: Pleas Koch, NP   Primary EP: Dr Rayann Heman  [name] is a 67 y.o. female who presents today for routine electrophysiology followup.  Since last being seen in our clinic, the patient reports doing very well. She has occasional palpitations.  she is in afib today.  She was unaware but reports that she feels "anxious".  + snoring and takes naps daily due to daytime somnolence.   Today, she denies symptoms of palpitations, chest pain, shortness of breath,  lower extremity edema, dizziness, presyncope, or syncope.  The patient is otherwise without complaint today.   Past Medical History:  Diagnosis Date  . Anxiety   . Fibroids    Uterine  . Heart murmur   . Hyperlipidemia   . Overweight    Past Surgical History:  Procedure Laterality Date  . NOSE SURGERY    . ROTATOR CUFF REPAIR    . TONSILLECTOMY      ROS- all systems are reviewed and negatives except as per HPI above  No current outpatient medications on file.   No current facility-administered medications for this visit.     Physical Exam: Vitals:   10/17/18 1612  BP: 106/70  Pulse: 62  SpO2: 97%  Weight: 227 lb (103 kg)  Height: 5\' 6"  (1.676 m)    GEN- The patient is well appearing, alert and oriented x 3 today.   Head- normocephalic, atraumatic Eyes-  Sclera clear, conjunctiva pink Ears- hearing intact Oropharynx- clear Lungs- Clear to ausculation bilaterally, normal work of breathing Heart- irregular rate and rhythm, no murmurs, rubs or gallops, PMI not laterally displaced GI- soft, NT, ND, + BS Extremities- no clubbing, cyanosis, or edema  Wt Readings from Last 3 Encounters:  10/17/18 227 lb (103 kg)  10/03/18 231 lb 12 oz (105.1 kg)  10/01/17 228 lb 6.4 oz (103.6 kg)    EKG tracing ordered today is personally reviewed and shows afib, V rate 135 bpm  Echo 2018 is reviewed,  EF normal, no significant structural heart changes  Assessment and Plan:  1. Paroxysmal atrial fibrillation She  has occasional afib but in general feels well chads2vasc score is 1.  Per guidelines anticoagulation is not required. Start diltiazem CD 120mg  daily Lifestyle modification was discussed at length today  2. Obesity Body mass index is 36.64 kg/m. Lifestyle modification is encouraged She has not made progress Refer to Dennard Nip MD  3. Snoring order sleep study  Follow-up in AF clinic in 4 weeks  Thompson Grayer MD, [facility] 10/17/2018 4:26 PM

## 2018-10-17 NOTE — Patient Instructions (Addendum)
Medication Instructions:  Your physician has recommended you make the following change in your medication:   1.  Start taking diltiazem CD 120 mg--Take one capsule by mouth daily  Labwork: None ordered.  Testing/Procedures: None ordered.  Follow-Up: Your physician wants you to follow-up in: 4 weeks with Afib clinic.     Any Other Special Instructions Will Be Listed Below (If Applicable).  A referral has been placed to [facility] for [facility].  A referral has been placed to [facility] for a sleep study.  You will be contacted by these facilities to schedule appointments.  If you need a refill on your cardiac medications before your next appointment, please call your pharmacy.

## 2018-11-01 NOTE — Telephone Encounter (Signed)
Preparing chart for Previsit scheduled for 11/08/18. No pathology results present. [name] checked with [employer]. The only specimens received for this patient have been a skin biopsy, no polyps. Previous Colon, 08/01/2012, 2 polyps removed by Dr Collene Mares. Prep was poor with stool present. Should we continue with planned procedure, Friday, 11/18/18

## 2018-11-01 NOTE — Telephone Encounter (Signed)
OK to proceed. Thank you. GM

## 2018-11-08 ENCOUNTER — Ambulatory Visit (AMBULATORY_SURGERY_CENTER): Payer: Self-pay

## 2018-11-08 ENCOUNTER — Telehealth: Payer: Self-pay

## 2018-11-08 VITALS — Ht 66.0 in | Wt 228.0 lb

## 2018-11-08 DIAGNOSIS — Z1211 Encounter for screening for malignant neoplasm of colon: Secondary | ICD-10-CM

## 2018-11-08 MED ORDER — PEG 3350-KCL-NA BICARB-NACL 420 G PO SOLR: 4000.0000 mL | Freq: Once | ORAL | 0 refills | Status: AC

## 2018-11-08 NOTE — Telephone Encounter (Signed)
Called patient and left a message that it was ok to proceed with scheduled colonoscopy. Patient was informed of Dr. Donneta Romberg recommendations. Patient was instructed to call if she experiences any changes.   Riki Sheer, LPN

## 2018-11-08 NOTE — Telephone Encounter (Signed)
Thank you for your quick response. The patient was ok with going forward with a colonoscopy. I will let her know your recommendations. Thanks!  Riki Sheer, LPN

## 2018-11-08 NOTE — Telephone Encounter (Signed)
I went through the patient's note and as the patient has already seen her cardiologist and as she has paroxysmal atrial fibrillation she will come in and out of it at times.  I am okay for the patient to proceed moving forward as she is not on any anticoagulation.  If she were to develop any electrolyte issues or significant A. fib with RVR that would be the only indication to not perform the procedure and thus I think is moving okay to move forward with the scheduled time point.  If she has any questions or wants to be seen in clinic we be happy to arrange that with myself or with 1 of the PAs before otherwise I think it is reasonable to move forward and if things change or she develops any new issues were happy to reschedule. Thank you for reaching out. GM

## 2018-11-08 NOTE — Telephone Encounter (Signed)
Dr. Rush Landmark,  I saw [name] in [facility] today. MRN: [id number]. She is scheduled for a colonoscopy on 11-29-18 @ 8:00 Am. The patient informed me that she was in A-fib and saw her cardiologist Dr. Thompson Grayer on 10/17/18. Dr. Rayann Heman states that is in mild A-Fib. [name] said that she told him that she was scheduled for a colonoscopy and would it be ok to proceed. According to the patient he said it was ok. I did want to make you aware to see if you needed to follow up with Dr. Rayann Heman or would you rather cancel her procedure until she is no longer in A-Fib? Please advise!   Riki Sheer, LPN ( [facility] )

## 2018-11-08 NOTE — Progress Notes (Signed)
Denies allergies to eggs or soy products. Denies complication of anesthesia or sedation. Denies use of weight loss medication. Denies use of O2.   Emmi instructions declined.  

## 2018-11-14 ENCOUNTER — Ambulatory Visit (HOSPITAL_COMMUNITY)
Admission: RE | Admit: 2018-11-14 | Discharge: 2018-11-14 | Disposition: A | Discharge status: Home / Self Care | Payer: Medicare HMO | Source: Ambulatory Visit | Attending: Nurse Practitioner | Admitting: Nurse Practitioner

## 2018-11-14 ENCOUNTER — Encounter (HOSPITAL_COMMUNITY): Payer: Self-pay | Admitting: Nurse Practitioner

## 2018-11-14 ENCOUNTER — Telehealth (INDEPENDENT_AMBULATORY_CARE_PROVIDER_SITE_OTHER): Payer: Self-pay

## 2018-11-14 VITALS — BP 122/82 | HR 74 | Ht 66.0 in | Wt 226.2 lb

## 2018-11-14 DIAGNOSIS — Z79899 Other long term (current) drug therapy: Secondary | ICD-10-CM | POA: Insufficient documentation

## 2018-11-14 DIAGNOSIS — I480 Paroxysmal atrial fibrillation: Secondary | ICD-10-CM | POA: Insufficient documentation

## 2018-11-14 DIAGNOSIS — I4891 Unspecified atrial fibrillation: Secondary | ICD-10-CM | POA: Diagnosis not present

## 2018-11-14 DIAGNOSIS — Z87891 Personal history of nicotine dependence: Secondary | ICD-10-CM | POA: Insufficient documentation

## 2018-11-14 DIAGNOSIS — E785 Hyperlipidemia, unspecified: Secondary | ICD-10-CM | POA: Diagnosis not present

## 2018-11-14 MED ORDER — DILTIAZEM HCL 30 MG PO TABS: ORAL_TABLET | ORAL | 2 refills | Status: DC

## 2018-11-14 NOTE — Progress Notes (Signed)
Primary Care Physician: Pleas Koch, NP Referring Physician: Dr. Tye Savoy is a 68 y.o. female with a h/o paroxysmal afib that is in the clinic as f/u from  last visit with Dr.Allred.   She was started on daily 120 mg Cardizem and feels this has been helpful to decrease afib burden.  It sounds that her episodes are infrequent, one a month?, last less than one hour.  Today, she denies symptoms of palpitations, chest pain, shortness of breath, orthopnea, PND, lower extremity edema, dizziness, presyncope, syncope, or neurologic sequela. The patient is tolerating medications without difficulties and is otherwise without complaint today.   Past Medical History:  Diagnosis Date  . Allergy   . Anxiety   . Arthritis   . Fibroids    Uterine  . Heart murmur   . Hyperlipidemia   . Overweight    Past Surgical History:  Procedure Laterality Date  . NOSE SURGERY    . ROTATOR CUFF REPAIR    . TONSILLECTOMY      Current Outpatient Medications  Medication Sig Dispense Refill  . diltiazem (CARDIZEM CD) 120 MG 24 hr capsule Take 1 capsule (120 mg total) by mouth daily. 90 capsule 3  . co-enzyme Q-10 30 MG capsule Take 30 mg by mouth 3 (three) times daily.    Marland Kitchen diltiazem (CARDIZEM) 30 MG tablet Take 1 tablet every 4 hours AS NEEDED for heart rate >100 as long as blood pressure >100. 45 tablet 2  . Multiple Vitamin (MULTIVITAMIN) tablet Take 1 tablet by mouth daily.    . Omega 3 1000 MG CAPS Take by mouth.    . vitamin B-12 (CYANOCOBALAMIN) 500 MCG tablet Take 500 mcg by mouth daily.     No current facility-administered medications for this encounter.     No Known Allergies  Social History   Socioeconomic History  . Marital status: Single    Spouse name: Not on file  . Number of children: Not on file  . Years of education: Not on file  . Highest education level: Not on file  Occupational History  . Not on file  Social Needs  . Financial resource strain: Not on  file  . Food insecurity:    Worry: Not on file    Inability: Not on file  . Transportation needs:    Medical: Not on file    Non-medical: Not on file  Tobacco Use  . Smoking status: Former Smoker    Years: 25.00    Types: Cigarettes  . Smokeless tobacco: Never Used  . Tobacco comment: smoked off and on for about 25 years  Substance and Sexual Activity  . Alcohol use: Yes    Alcohol/week: 0.0 standard drinks    Comment: seldom  . Drug use: No  . Sexual activity: Not on file  Lifestyle  . Physical activity:    Days per week: Not on file    Minutes per session: Not on file  . Stress: Not on file  Relationships  . Social connections:    Talks on phone: Not on file    Gets together: Not on file    Attends religious service: Not on file    Active member of club or organization: Not on file    Attends meetings of clubs or organizations: Not on file    Relationship status: Not on file  . Intimate partner violence:    Fear of current or ex partner: Not on file  Emotionally abused: Not on file    Physically abused: Not on file    Forced sexual activity: Not on file  Other Topics Concern  . Not on file  Social History Narrative   Pt lives in [place] with boyfriend of 21 years.   Retired Web designer    Family History  Problem Relation Age of Onset  . Stomach cancer Maternal Grandmother   . Colon cancer Neg Hx   . Esophageal cancer Neg Hx     ROS- All systems are reviewed and negative except as per the HPI above  Physical Exam: Vitals:   11/14/18 1056  BP: 122/82  Pulse: 74  Weight: 102.6 kg  Height: 5\' 6"  (1.676 m)   Wt Readings from Last 3 Encounters:  11/14/18 102.6 kg  11/08/18 103.4 kg  10/17/18 103 kg    Labs: Lab Results  Component Value Date   NA 141 10/07/2018   K 4.2 10/07/2018   CL 105 10/07/2018   CO2 25 10/07/2018   GLUCOSE 94 10/07/2018   BUN 24 (H) 10/07/2018   CREATININE 0.90 10/07/2018   CALCIUM 9.5 10/07/2018   No  results found for: INR Lab Results  Component Value Date   CHOL 240 (H) 10/07/2018   HDL 51.10 10/07/2018   LDLCALC 159 (H) 10/07/2018   TRIG 151.0 (H) 10/07/2018     GEN- The patient is well appearing, alert and oriented x 3 today.   Head- normocephalic, atraumatic Eyes-  Sclera clear, conjunctiva pink Ears- hearing intact Oropharynx- clear Neck- supple, no JVP Lymph- no cervical lymphadenopathy Lungs- Clear to ausculation bilaterally, normal work of breathing Heart- Regular rate and rhythm, no murmurs, rubs or gallops, PMI not laterally displaced GI- soft, NT, ND, + BS Extremities- no clubbing, cyanosis, or edema MS- no significant deformity or atrophy Skin- no rash or lesion Psych- euthymic mood, full affect Neuro- strength and sensation are intact  EKG-NSR at 74 bpm, PR int 146 ms, qrs int 72 ms, qtc 419 ms Epic records reviewed   Assessment and Plan: 1. Paroxysmal afib General education re afib  Doing well on daily Cardizem 120 mg daily Will RX 30 mg cardizem if needed Currently chadsvasc score is 2 and by guidelines does not require anticoagulation Will refer to the [facility] exercise program, she seems very motivated and to  the weight management class   F/u in afib clinic in 4 months   Butch Penny C. Petronella Shuford, [facility] [address] [place], [place] [address] [phone]

## 2018-11-14 NOTE — Telephone Encounter (Signed)
Submitted VOB for Monovisc, right knee. 

## 2018-11-14 NOTE — Patient Instructions (Signed)
Cardizem 30mg -- take 1 tablet every 4 hours AS NEEDED for AFIB heart rate >100 as long as blood pressure >100.    

## 2018-11-15 ENCOUNTER — Encounter: Payer: Self-pay | Admitting: Gastroenterology

## 2018-11-16 ENCOUNTER — Telehealth (INDEPENDENT_AMBULATORY_CARE_PROVIDER_SITE_OTHER): Payer: Self-pay

## 2018-11-16 ENCOUNTER — Telehealth: Payer: Self-pay | Admitting: Primary Care

## 2018-11-16 NOTE — Telephone Encounter (Signed)
Pt put in following request for pneumonia shot. Please advise on if this is ok to schedule:  Appointment Request From: Ferman Hamming    With Provider: Pleas Koch, NP [facility]    Preferred Date Range: From 12/05/2018 To 12/09/2018    Preferred Times: Any    Reason: To address the following health maintenance concerns.  Pna Vac Low Risk Adult    Comments:  Appointment Request Times  Feb 3 - 8am - 5pm   Feb 4 - 2pm - 5pm  Feb 5 - 3pm - 5pm  Feb 6 - 1pm - 5pm  Feb 7 - 8am - 5pm

## 2018-11-16 NOTE — Telephone Encounter (Signed)
Patient will CB to schedule appointment for gel injection with Dr. Ninfa Linden.  Patient is approved for Monovisc, right knee. [place] Patient will be responsible for 20% of the allowable amount. No Co-pay No PA required

## 2018-11-16 NOTE — Telephone Encounter (Signed)
Okay to schedule patient for vaccine.

## 2018-11-16 NOTE — Telephone Encounter (Signed)
Okay to schedule

## 2018-11-16 NOTE — Telephone Encounter (Signed)
Yes, please administer Pneumovax.

## 2018-11-17 ENCOUNTER — Ambulatory Visit (HOSPITAL_COMMUNITY): Payer: Medicare HMO | Admitting: Nurse Practitioner

## 2018-11-18 ENCOUNTER — Ambulatory Visit (INDEPENDENT_AMBULATORY_CARE_PROVIDER_SITE_OTHER): Payer: Medicare HMO | Admitting: Pulmonary Disease

## 2018-11-18 ENCOUNTER — Encounter: Payer: Medicare HMO | Admitting: Gastroenterology

## 2018-11-18 ENCOUNTER — Encounter: Payer: Self-pay | Admitting: Pulmonary Disease

## 2018-11-18 VITALS — BP 120/76 | HR 85 | Ht 66.0 in | Wt 224.0 lb

## 2018-11-18 DIAGNOSIS — R0683 Snoring: Secondary | ICD-10-CM | POA: Diagnosis not present

## 2018-11-18 DIAGNOSIS — G4733 Obstructive sleep apnea (adult) (pediatric): Secondary | ICD-10-CM | POA: Diagnosis not present

## 2018-11-18 NOTE — Patient Instructions (Signed)
Daytime sleepiness History of snoring  Moderate probability of significant sleep apnea  We will order a home sleep study Treatment options will be a CPAP machine if study is positive  Continue weight loss efforts I will see you back in the office in about 3 months  Try and spend  not more than 8 hours in bed nightly  Sleep Apnea Sleep apnea is a condition in which breathing pauses or becomes shallow during sleep. Episodes of sleep apnea usually last 10 seconds or longer, and they may occur as many as 20 times an hour. Sleep apnea disrupts your sleep and keeps your body from getting the rest that it needs. This condition can increase your risk of certain health problems, including:  Heart attack.  Stroke.  Obesity.  Diabetes.  Heart failure.  Irregular heartbeat. There are three kinds of sleep apnea:  Obstructive sleep apnea. This kind is caused by a blocked or collapsed airway.  Central sleep apnea. This kind happens when the part of the brain that controls breathing does not send the correct signals to the muscles that control breathing.  Mixed sleep apnea. This is a combination of obstructive and central sleep apnea. What are the causes? The most common cause of this condition is a collapsed or blocked airway. An airway can collapse or become blocked if:  Your throat muscles are abnormally relaxed.  Your tongue and tonsils are larger than normal.  You are overweight.  Your airway is smaller than normal. What increases the risk? This condition is more likely to develop in people who:  Are overweight.  Smoke.  Have a smaller than normal airway.  Are elderly.  Are female.  Drink alcohol.  Take sedatives or tranquilizers.  Have a family history of sleep apnea. What are the signs or symptoms? Symptoms of this condition include:  Trouble staying asleep.  Daytime sleepiness and tiredness.  Irritability.  Loud snoring.  Morning headaches.  Trouble  concentrating.  Forgetfulness.  Decreased interest in sex.  Unexplained sleepiness.  Mood swings.  Personality changes.  Feelings of depression.  Waking up often during the night to urinate.  Dry mouth.  Sore throat. How is this diagnosed? This condition may be diagnosed with:  A medical history.  A physical exam.  A series of tests that are done while you are sleeping (sleep study). These tests are usually done in a sleep lab, but they may also be done at home. How is this treated? Treatment for this condition aims to restore normal breathing and to ease symptoms during sleep. It may involve managing health issues that can affect breathing, such as high blood pressure or obesity. Treatment may include:  Sleeping on your side.  Using a decongestant if you have nasal congestion.  Avoiding the use of depressants, including alcohol, sedatives, and narcotics.  Losing weight if you are overweight.  Making changes to your diet.  Quitting smoking.  Using a device to open your airway while you sleep, such as: ? An oral appliance. This is a custom-made mouthpiece that shifts your lower jaw forward. ? A continuous positive airway pressure (CPAP) device. This device delivers oxygen to your airway through a mask. ? A nasal expiratory positive airway pressure (EPAP) device. This device has valves that you put into each nostril. ? A bi-level positive airway pressure (BPAP) device. This device delivers oxygen to your airway through a mask.  Surgery if other treatments do not work. During surgery, excess tissue is removed to create a  wider airway. It is important to get treatment for sleep apnea. Without treatment, this condition can lead to:  High blood pressure.  Coronary artery disease.  (Men) An inability to achieve or maintain an erection (impotence).  Reduced thinking abilities. Follow these instructions at home:  Make any lifestyle changes that your health care  provider recommends.  Eat a healthy, well-balanced diet.  Take over-the-counter and prescription medicines only as told by your health care provider.  Avoid using depressants, including alcohol, sedatives, and narcotics.  Take steps to lose weight if you are overweight.  If you were given a device to open your airway while you sleep, use it only as told by your health care provider.  Do not use any tobacco products, such as cigarettes, chewing tobacco, and e-cigarettes. If you need help quitting, ask your health care provider.  Keep all follow-up visits as told by your health care provider. This is important. Contact a health care provider if:  The device that you received to open your airway during sleep is uncomfortable or does not seem to be working.  Your symptoms do not improve.  Your symptoms get worse. Get help right away if:  You develop chest pain.  You develop shortness of breath.  You develop discomfort in your back, arms, or stomach.  You have trouble speaking.  You have weakness on one side of your body.  You have drooping in your face. These symptoms may represent a serious problem that is an emergency. Do not wait to see if the symptoms will go away. Get medical help right away. Call your local emergency services (911 in the [place] Do not drive yourself to the hospital. This information is not intended to replace advice given to you by your health care provider. Make sure you discuss any questions you have with your health care provider. Document Released: 10/09/2002 Document Revised: 05/17/2017 Document Reviewed: 07/29/2015 Elsevier Interactive Patient Education  2019 [employer].

## 2018-11-18 NOTE — Progress Notes (Signed)
[name]    [id number]    09-14-1951  Primary Care Physician:Clark, Leticia Penna, NP  Referring Physician: Pleas Koch, NP [place], [place] [address]  Chief complaint:   Patient with snoring and daytime sleepiness  HPI:  Snoring and daytime sleepiness has been for a few years Wakes up a few times during the night Usually goes to bed between 10 and 11 PM, able to fall asleep fairly easily Wakes up between 3 and 4 times during the night Final wake up time between 8 and 9 AM  No specific factors that wakes up during the night apart from using the bathroom  Admits to dryness of her mouth in the mornings Denies headaches Admits to night sweats  Daughter snores  Reformed smoker  Outpatient Encounter Medications as of 11/18/2018  Medication Sig  . co-enzyme Q-10 30 MG capsule Take 30 mg by mouth 3 (three) times daily.  Marland Kitchen diltiazem (CARDIZEM CD) 120 MG 24 hr capsule Take 1 capsule (120 mg total) by mouth daily.  . Multiple Vitamin (MULTIVITAMIN) tablet Take 1 tablet by mouth daily.  . Omega 3 1000 MG CAPS Take by mouth.  . vitamin B-12 (CYANOCOBALAMIN) 500 MCG tablet Take 500 mcg by mouth daily.  . [DISCONTINUED] diltiazem (CARDIZEM) 30 MG tablet Take 1 tablet every 4 hours AS NEEDED for heart rate >100 as long as blood pressure >100.   No facility-administered encounter medications on file as of 11/18/2018.     Allergies as of 11/18/2018  . (No Known Allergies)    Past Medical History:  Diagnosis Date  . Allergy   . Anxiety   . Arthritis   . Fibroids    Uterine  . Heart murmur   . Hyperlipidemia   . Overweight     Past Surgical History:  Procedure Laterality Date  . NOSE SURGERY    . ROTATOR CUFF REPAIR    . TONSILLECTOMY      Family History  Problem Relation Age of Onset  . Stomach cancer Maternal Grandmother   . Colon cancer Neg Hx   . Esophageal cancer Neg Hx     Social History   Socioeconomic History  .  Marital status: Single    Spouse name: Not on file  . Number of children: Not on file  . Years of education: Not on file  . Highest education level: Not on file  Occupational History  . Not on file  Social Needs  . Financial resource strain: Not on file  . Food insecurity:    Worry: Not on file    Inability: Not on file  . Transportation needs:    Medical: Not on file    Non-medical: Not on file  Tobacco Use  . Smoking status: Former Smoker    Years: 25.00    Types: Cigarettes  . Smokeless tobacco: Never Used  . Tobacco comment: smoked off and on for about 25 years  Substance and Sexual Activity  . Alcohol use: Yes    Alcohol/week: 0.0 standard drinks    Comment: seldom  . Drug use: No  . Sexual activity: Not on file  Lifestyle  . Physical activity:    Days per week: Not on file    Minutes per session: Not on file  . Stress: Not on file  Relationships  . Social connections:    Talks on phone: Not on file    Gets together: Not on  file    Attends religious service: Not on file    Active member of club or organization: Not on file    Attends meetings of clubs or organizations: Not on file    Relationship status: Not on file  . Intimate partner violence:    Fear of current or ex partner: Not on file    Emotionally abused: Not on file    Physically abused: Not on file    Forced sexual activity: Not on file  Other Topics Concern  . Not on file  Social History Narrative   Pt lives in [place] with boyfriend of 21 years.   Retired Web designer    Review of Systems  Constitutional: Negative.   HENT: Positive for congestion.   Eyes: Negative.   Respiratory: Negative.  Negative for cough and shortness of breath.   Cardiovascular: Negative.   Gastrointestinal: Negative.   Endocrine: Negative.   All other systems reviewed and are negative.   Vitals:   11/18/18 1149  BP: 120/76  Pulse: 85  SpO2: 98%     Physical Exam  Constitutional: She is  oriented to person, place, and time. She appears well-developed and well-nourished.  HENT:  Head: Normocephalic and atraumatic.  Mallampati 2  Eyes: Pupils are equal, round, and reactive to light. Conjunctivae and EOM are normal. Right eye exhibits no discharge. Left eye exhibits no discharge.  Neck: Normal range of motion. Neck supple. No tracheal deviation present. No thyromegaly present.  Cardiovascular: Normal rate and regular rhythm.  Pulmonary/Chest: Effort normal and breath sounds normal. No respiratory distress. She has no wheezes.  Abdominal: Soft. Bowel sounds are normal. She exhibits no distension. There is no abdominal tenderness. There is no rebound.  Musculoskeletal: Normal range of motion.        General: No deformity or edema.  Neurological: She is alert and oriented to person, place, and time.   Results of the Epworth flowsheet 11/18/2018  Sitting and reading 2  Watching TV 2  Sitting, inactive in a public place (e.g. a theatre or a meeting) 1  As a passenger in a car for an hour without a break 2  Lying down to rest in the afternoon when circumstances permit 2  Sitting and talking to someone 1  Sitting quietly after a lunch without alcohol 2  In a car, while stopped for a few minutes in traffic 0  Total score 12    Assessment:  Moderate probability of significant obstructive sleep apnea Obesity Nonrestorative sleep Excessive daytime sleepiness  Plan/Recommendations: We will schedule patient for home sleep study  Importance of exercise and weight loss discussed with the patient  Pathophysiology of sleep disordered breathing discussed with the patient  I will see her back in the office in about 3 months Encouraged to call with any significant concerns   Sherrilyn Rist MD [facility] 11/18/2018, 11:55 AM  CC: Pleas Koch, NP

## 2018-11-21 ENCOUNTER — Telehealth: Payer: Self-pay

## 2018-11-21 DIAGNOSIS — D485 Neoplasm of uncertain behavior of skin: Secondary | ICD-10-CM | POA: Diagnosis not present

## 2018-11-21 DIAGNOSIS — L570 Actinic keratosis: Secondary | ICD-10-CM | POA: Diagnosis not present

## 2018-11-21 NOTE — Telephone Encounter (Addendum)
Left a VM for Ms. Fuente to call back regarding the 12-week PREP at the [facility].    At 1637:  [name] called back and is interested in participating in the PREP at the [facility] that will start in late Feb/early March and we will be in contact with each other sometime in early to mid Feb to set up an intake appt.

## 2018-11-24 ENCOUNTER — Ambulatory Visit (HOSPITAL_COMMUNITY): Payer: Medicare HMO | Admitting: Nurse Practitioner

## 2018-11-24 ENCOUNTER — Ambulatory Visit: Payer: Medicare HMO

## 2018-11-28 ENCOUNTER — Other Ambulatory Visit: Payer: Medicare HMO

## 2018-11-28 ENCOUNTER — Ambulatory Visit: Payer: Medicare HMO

## 2018-11-29 ENCOUNTER — Encounter: Payer: Self-pay | Admitting: Gastroenterology

## 2018-11-29 ENCOUNTER — Ambulatory Visit (AMBULATORY_SURGERY_CENTER): Payer: Medicare HMO | Admitting: Gastroenterology

## 2018-11-29 VITALS — BP 107/62 | HR 70 | Temp 98.7°F | Resp 17 | Ht 66.0 in | Wt 228.0 lb

## 2018-11-29 DIAGNOSIS — Z1211 Encounter for screening for malignant neoplasm of colon: Secondary | ICD-10-CM | POA: Diagnosis not present

## 2018-11-29 DIAGNOSIS — Z800 Family history of malignant neoplasm of digestive organs: Secondary | ICD-10-CM

## 2018-11-29 MED ORDER — SODIUM CHLORIDE 0.9 % IV SOLN: 500.0000 mL | Freq: Once | INTRAVENOUS | Status: DC

## 2018-11-29 NOTE — Progress Notes (Signed)
Spontaneous respirations throughout. VSS. Resting comfortably. To PACU on room air. Report to  RN. 

## 2018-11-29 NOTE — Op Note (Signed)
[facility] Patient Name: [name] Procedure Date: 11/29/2018 7:56 AM MRN: [id number] Endoscopist: Justice Britain , MD Age: 68 Referring MD:  Date of Birth: 1951-06-25 Gender: Female Account #: [id number] Procedure:                Colonoscopy Indications:              Surveillance: Personal history of colonic polyps                            (unknown histology) on last colonoscopy more than 5                            years ago Medicines:                Monitored Anesthesia Care Procedure:                Pre-Anesthesia Assessment:                           - Prior to the procedure, a History and Physical                            was performed, and patient medications and                            allergies were reviewed. The patient's tolerance of                            previous anesthesia was also reviewed. The risks                            and benefits of the procedure and the sedation                            options and risks were discussed with the patient.                            All questions were answered, and informed consent                            was obtained. Prior Anticoagulants: The patient has                            taken no previous anticoagulant or antiplatelet                            agents. ASA Grade Assessment: II - A patient with                            mild systemic disease. After reviewing the risks                            and benefits, the patient was deemed in  satisfactory condition to undergo the procedure.                           After obtaining informed consent, the colonoscope                            was passed under direct vision. Throughout the                            procedure, the patient's blood pressure, pulse, and                            oxygen saturations were monitored continuously. The                            Colonoscope was introduced through the anus  and                            advanced to the the cecum, identified by                            transillumination. The colonoscopy was performed                            without difficulty. The patient tolerated the                            procedure. The quality of the bowel preparation was                            evaluated using the BBPS [facility] Bowel Preparation                            Scale) with scores of: Right Colon = 3, Transverse                            Colon = 3 and Left Colon = 3 (entire mucosa seen                            well with no residual staining, small fragments of                            stool or opaque liquid). The total BBPS score                            equals 9. Scope In: 8:16:32 AM Scope Out: 8:33:13 AM Scope Withdrawal Time: 0 hours 11 minutes 9 seconds  Total Procedure Duration: 0 hours 16 minutes 41 seconds  Findings:                 The digital rectal exam findings include                            non-thrombosed external hemorrhoids. Pertinent  negatives include no palpable rectal lesions.                           Skin tags were found on perianal exam.                           Normal mucosa was found in the entire colon.                           Non-bleeding non-thrombosed external and internal                            hemorrhoids were found during retroflexion. The                            hemorrhoids were Grade I (internal hemorrhoids that                            do not prolapse). Complications:            No immediate complications. Estimated Blood Loss:     Estimated blood loss: none. Impression:               - Non-thrombosed external hemorrhoids found on                            digital rectal exam.                           - Perianal skin tags found on perianal exam.                           - Normal mucosa in the entire examined colon.                           - Non-bleeding  non-thrombosed external and internal                            hemorrhoids. Recommendation:           - The patient will be observed post-procedure,                            until all discharge criteria are met.                           - Discharge patient to home.                           - Patient has a contact number available for                            emergencies. The signs and symptoms of potential                            delayed complications were discussed with the  patient. Return to normal activities tomorrow.                            Written discharge instructions were provided to the                            patient.                           - High fiber diet.                           - Use fiber, for example Citrucel, Fibercon, Konsyl                            or Metamucil once daily.                           - Repeat colonoscopy in 5 years for screening                            purposes.                           - The findings and recommendations were discussed                            with the patient.                           - The findings and recommendations were discussed                            with the patient's family. Justice Britain, MD 11/29/2018 8:40:50 AM

## 2018-11-29 NOTE — Patient Instructions (Signed)
YOU HAD AN ENDOSCOPIC PROCEDURE TODAY AT [facility]:   Refer to the procedure report that was given to you for any specific questions about what was found during the examination.  If the procedure report does not answer your questions, please call your gastroenterologist to clarify.  If you requested that your care partner not be given the details of your procedure findings, then the procedure report has been included in a sealed envelope for you to review at your convenience later.  YOU SHOULD EXPECT: Some feelings of bloating in the abdomen. Passage of more gas than usual.  Walking can help get rid of the air that was put into your GI tract during the procedure and reduce the bloating. If you had a lower endoscopy (such as a colonoscopy or flexible sigmoidoscopy) you may notice spotting of blood in your stool or on the toilet paper. If you underwent a bowel prep for your procedure, you may not have a normal bowel movement for a few days.  Please Note:  You might notice some irritation and congestion in your nose or some drainage.  This is from the oxygen used during your procedure.  There is no need for concern and it should clear up in a day or so.  SYMPTOMS TO REPORT IMMEDIATELY:   Following lower endoscopy (colonoscopy or flexible sigmoidoscopy):  Excessive amounts of blood in the stool  Significant tenderness or worsening of abdominal pains  Swelling of the abdomen that is new, acute  Fever of 100F or higher  For urgent or emergent issues, a gastroenterologist can be reached at any hour by calling [phone].   DIET:  We do recommend a small meal at first, but then you may proceed to your regular diet.  Drink plenty of fluids but you should avoid alcoholic beverages for 24 hours.  ACTIVITY:  You should plan to take it easy for the rest of today and you should NOT DRIVE or use heavy machinery until tomorrow (because of the sedation medicines used during the test).     FOLLOW UP: Our staff will call the number listed on your records the next business day following your procedure to check on you and address any questions or concerns that you may have regarding the information given to you following your procedure. If we do not reach you, we will leave a message.  However, if you are feeling well and you are not experiencing any problems, there is no need to return our call.  We will assume that you have returned to your regular daily activities without incident.  If any biopsies were taken you will be contacted by phone or by letter within the next 1-3 weeks.  Please call us at [phone] if you have not heard about the biopsies in 3 weeks.    SIGNATURES/CONFIDENTIALITY: You and/or your care partner have signed paperwork which will be entered into your electronic medical record.  These signatures attest to the fact that that the information above on your After Visit Summary has been reviewed and is understood.  Full responsibility of the confidentiality of this discharge information lies with you and/or your care-partner.  Hemorrhoid information given.  High fiber diet information given.  Use fiber, for example, Citrucel, Fibercon, Konsyl,  or Metamucil  Once daily.  Recall 5 years-2025

## 2018-11-30 ENCOUNTER — Telehealth: Payer: Self-pay | Admitting: *Deleted

## 2018-11-30 NOTE — Telephone Encounter (Signed)
  Follow up Call-  Call back number 11/29/2018  Post procedure Call Back phone  # [phone]  Permission to leave phone message Yes  Some recent data might be hidden     Patient questions:  Do you have a fever, pain , or abdominal swelling? No. Pain Score  0 *  Have you tolerated food without any problems? Yes.    Have you been able to return to your normal activities? Yes.    Do you have any questions about your discharge instructions: Diet   No. Medications  No. Follow up visit  No.  Do you have questions or concerns about your Care? No.  Actions: * If pain score is 4 or above: No action needed, pain <4.

## 2018-12-06 ENCOUNTER — Ambulatory Visit: Payer: Medicare HMO

## 2018-12-06 ENCOUNTER — Ambulatory Visit (INDEPENDENT_AMBULATORY_CARE_PROVIDER_SITE_OTHER): Payer: Medicare HMO

## 2018-12-06 DIAGNOSIS — Z23 Encounter for immunization: Secondary | ICD-10-CM

## 2018-12-16 ENCOUNTER — Telehealth: Payer: Self-pay

## 2018-12-16 NOTE — Telephone Encounter (Signed)
Pt called in stating that since she started diltiazem 120mg  that she has felt lethargic and has not felt like doing any of her normal activities, such as working out on a daily basis. She also states that she is beginning to get depressed over this issue. She would like a call back at [phone]. Please address. Thank you

## 2018-12-19 ENCOUNTER — Ambulatory Visit
Admission: RE | Admit: 2018-12-19 | Discharge: 2018-12-19 | Disposition: A | Discharge status: Home / Self Care | Payer: Medicare HMO | Source: Ambulatory Visit | Attending: Primary Care | Admitting: Primary Care

## 2018-12-19 DIAGNOSIS — Z1239 Encounter for other screening for malignant neoplasm of breast: Secondary | ICD-10-CM

## 2018-12-19 DIAGNOSIS — Z1231 Encounter for screening mammogram for malignant neoplasm of breast: Secondary | ICD-10-CM | POA: Diagnosis not present

## 2018-12-19 NOTE — Telephone Encounter (Signed)
Returned call to patient - states she is feeling much better today and does not feel she needs to come in earlier than May. She is working on weight loss and is hopeful she can just come off cardizem daily. For now she will try switching her daily dose of cardizem to bedtime and see if this helps with the fatigue. Pt instructed to call back if further issues.

## 2018-12-20 ENCOUNTER — Telehealth: Payer: Self-pay

## 2018-12-20 NOTE — Telephone Encounter (Signed)
Pt left v/m that she received a bill for colonoscopy and pt needs an approval referral from PCP for the colonoscopy to be paid for.

## 2018-12-20 NOTE — Telephone Encounter (Signed)
[name], is there anything we can do for this patient to get her referral attached to the colonoscopy?

## 2018-12-20 NOTE — Telephone Encounter (Signed)
Called [facility] and spoke to [employer]. She explained that the patient had a diagnostic colonoscopy 6 years ago. From now on the patient will be billed for a Diagnostic colonoscopy even if they dont find any polyps, the diagnostic stays with you forever. Called the patient and gave her Jesi;s name and number to call to confirm the information I gave her. [facility] billed her for a screening colonoscopy and her Insurance charged her for a diagnostic one. We no longer do referral's for [facility] and no referral letter will change the outcome. Advised patient to call [facility] and ask what her benefits are for diagnostic procedures because she admittedly did not know what her benefits would cover at all.

## 2018-12-23 DIAGNOSIS — R0683 Snoring: Secondary | ICD-10-CM

## 2018-12-25 DIAGNOSIS — G4733 Obstructive sleep apnea (adult) (pediatric): Secondary | ICD-10-CM | POA: Diagnosis not present

## 2018-12-30 DIAGNOSIS — G4733 Obstructive sleep apnea (adult) (pediatric): Secondary | ICD-10-CM | POA: Diagnosis not present

## 2019-01-04 ENCOUNTER — Ambulatory Visit
Admission: RE | Admit: 2019-01-04 | Discharge: 2019-01-04 | Disposition: A | Discharge status: Home / Self Care | Payer: Medicare HMO | Source: Ambulatory Visit | Attending: Primary Care | Admitting: Primary Care

## 2019-01-04 DIAGNOSIS — Z780 Asymptomatic menopausal state: Secondary | ICD-10-CM | POA: Diagnosis not present

## 2019-01-04 DIAGNOSIS — E2839 Other primary ovarian failure: Secondary | ICD-10-CM

## 2019-01-04 DIAGNOSIS — Z13820 Encounter for screening for osteoporosis: Secondary | ICD-10-CM | POA: Diagnosis not present

## 2019-01-05 ENCOUNTER — Telehealth: Payer: Self-pay | Admitting: Pulmonary Disease

## 2019-01-05 ENCOUNTER — Telehealth: Payer: Self-pay | Admitting: Primary Care

## 2019-01-05 NOTE — Telephone Encounter (Signed)
Pt need a referral for her colonoscopy she had 1.28.20 at [facility], so her insurance can pay for it.

## 2019-01-05 NOTE — Telephone Encounter (Signed)
Please notify patient that I placed a referral for her colonoscopy on 10/03/2018.  This should be good enough.

## 2019-01-05 NOTE — Telephone Encounter (Signed)
Dr. Ander Slade has reviewed the home sleep test this showed Mild osa .   Recommendations   Treatment options are CPAP with the settings auto 5 to 15.    Weight loss measures .   Advise against driving while sleepy & against medication with sedative side effects.    Make appointment for 3 months for compliance with download with Dr. Mack Hook patient she would like to wait on getting cpap and try weight loss . Apt made nothing further needed.

## 2019-01-06 ENCOUNTER — Telehealth: Payer: Self-pay | Admitting: Primary Care

## 2019-01-06 NOTE — Telephone Encounter (Signed)
Pt aware of kates comment  Pt stated she called [employer] they stated they cannot find the referral and this needs to be resent.  Please resend referral to [facility]  Phone [phone]  Please send this through portal [email or web address]   Select demo  type in pt autho request   Or  [email or web address]  Clinical intake for autho     Phone [phone]     Please call pt when this has been  [phone]

## 2019-01-06 NOTE — Telephone Encounter (Signed)
Best phone # [phone]  Pt called stating she has a cough and a lot of phlem.  She is wanting to know what she can take  over the counter with her other med to help this.   Pt stated this could be allergies

## 2019-01-06 NOTE — Telephone Encounter (Signed)
Called patient and unfortunately she had a Diagnostic Colonoscopy and her [employer] didn't pay for all of it and she felt like they should have. I have given the patient the Supervisors name in [facility] to talk to about this. We no longer do Referrals for [facility] as long as the Specialists are in the [place]. We referred the patient to [facility] for the Colonoscopy but no other Referral is required. Patient understood that we will not be doing a referral to [facility].

## 2019-01-06 NOTE — Telephone Encounter (Signed)
Please notify patient:  Cough: Delsym or Robitussin (get the off brand, works just as well) Chest Congestion: Mucinex (plain) Nasal Congestion/Ear Pressure/Sinus Pressure: Try using Flonase (fluticasone) nasal spray. Instill 1 spray in each nostril twice daily. This can be purchased over the counter. Body aches/fevers/chills: Ibuprofen or Tylenol Throat drainage/runny nose: Claritin/Allegra/Zyrtec

## 2019-01-06 NOTE — Telephone Encounter (Signed)
Spoken and notified patient of [name] Clark's comments. Patient verbalized understanding.  

## 2019-01-12 ENCOUNTER — Other Ambulatory Visit: Payer: Self-pay

## 2019-01-12 ENCOUNTER — Encounter: Payer: Self-pay | Admitting: Family Medicine

## 2019-01-12 ENCOUNTER — Ambulatory Visit (INDEPENDENT_AMBULATORY_CARE_PROVIDER_SITE_OTHER): Payer: Medicare HMO | Admitting: Family Medicine

## 2019-01-12 VITALS — BP 116/80 | HR 88 | Temp 97.8°F | Resp 14 | Ht 66.5 in | Wt 223.8 lb

## 2019-01-12 DIAGNOSIS — R05 Cough: Secondary | ICD-10-CM

## 2019-01-12 DIAGNOSIS — R058 Other specified cough: Secondary | ICD-10-CM

## 2019-01-12 MED ORDER — HYDROCOD POLST-CPM POLST ER 10-8 MG/5ML PO SUER: 5.0000 mL | Freq: Two times a day (BID) | ORAL | 0 refills | Status: DC | PRN

## 2019-01-12 NOTE — Progress Notes (Signed)
Subjective:     [name] is a 68 y.o. female presenting for Cough (deep in chest per patient, going on 3 weeks, no fever.)     Cough  This is a new problem. The current episode started 1 to 4 weeks ago. The cough is productive of sputum. Associated symptoms include nasal congestion. Pertinent negatives include no chills, ear congestion, ear pain, fever, myalgias, postnasal drip, rhinorrhea, sore throat, shortness of breath or wheezing. Nothing aggravates the symptoms. Treatments tried: mucinex. The treatment provided no relief. Her past medical history is significant for environmental allergies. There is no history of asthma or COPD.   Increased sputum production   Review of Systems  Constitutional: Negative for chills and fever.  HENT: Negative for ear pain, postnasal drip, rhinorrhea and sore throat.   Respiratory: Positive for cough. Negative for shortness of breath and wheezing.   Musculoskeletal: Negative for myalgias.  Allergic/Immunologic: Positive for environmental allergies.     Social History   Tobacco Use  Smoking Status Former Smoker  . Years: 25.00  . Types: Cigarettes  Smokeless Tobacco Never Used  Tobacco Comment   smoked off and on for about 25 years        Objective:    BP Readings from Last 3 Encounters:  01/12/19 116/80  11/29/18 107/62  11/18/18 120/76   Wt Readings from Last 3 Encounters:  01/12/19 223 lb 12 oz (101.5 kg)  11/29/18 228 lb (103.4 kg)  11/18/18 224 lb (101.6 kg)    BP 116/80 (BP Location: Right Arm, Patient Position: Sitting)   Pulse 88   Temp 97.8 F (36.6 C) (Oral)   Resp 14   Ht 5' 6.5" (1.689 m)   Wt 223 lb 12 oz (101.5 kg)   LMP 04/28/2012   SpO2 97%   BMI 35.57 kg/m    Physical Exam Constitutional:      General: She is not in acute distress.    Appearance: She is well-developed. She is not diaphoretic.  HENT:     Head: Normocephalic and atraumatic.     Right Ear: Tympanic membrane and ear canal  normal.     Left Ear: Tympanic membrane and ear canal normal.     Nose: Mucosal edema and rhinorrhea present.     Right Sinus: No maxillary sinus tenderness or frontal sinus tenderness.     Left Sinus: No maxillary sinus tenderness or frontal sinus tenderness.     Mouth/Throat:     Pharynx: Uvula midline. Posterior oropharyngeal erythema present. No oropharyngeal exudate.     Tonsils: Swelling: 0 on the right. 0 on the left.  Eyes:     General: No scleral icterus.    Conjunctiva/sclera: Conjunctivae normal.  Neck:     Musculoskeletal: Neck supple.  Cardiovascular:     Rate and Rhythm: Normal rate and regular rhythm.     Heart sounds: Normal heart sounds. No murmur.  Pulmonary:     Effort: Pulmonary effort is normal. No respiratory distress.     Breath sounds: Normal breath sounds.  Lymphadenopathy:     Cervical: No cervical adenopathy.  Skin:    General: Skin is warm and dry.     Capillary Refill: Capillary refill takes less than 2 seconds.  Neurological:     Mental Status: She is alert.           Assessment & Plan:   Problem List Items Addressed This Visit    None    Visit Diagnoses  Post-viral cough syndrome    -  Primary   Relevant Medications   chlorpheniramine-HYDROcodone (TUSSIONEX) 10-8 MG/5ML SUER     Recommended saline rinse Discussed if fever, worsening SOB to return sooner, otherwise return if not resolved in next 3 weeks  Return in about 3 weeks (around 02/02/2019), or if symptoms worsen or fail to improve.  Lesleigh Noe, MD

## 2019-01-12 NOTE — Patient Instructions (Addendum)
Based on your symptoms, it looks like you have a virus.   Antibiotics are not need for a viral infection but the following will help:   1. Drink plenty of fluids 2. Get lots of rest  Sinus Congestion 1) Neti Pot (Saline rinse) -- 2 times day -- if tolerated 2) Flonase (Store Brand ok) - once daily 3) Over the counter congestion medications  Cough 1) Cough drops can be helpful 2) Nyquil (or nighttime cough medication) 3) Honey is proven to be one of the best cough medications   Sore Throat 1) Honey as above, cough drops 2) Ibuprofen or Aleve can be helpful 3) Salt water Gargles  If you develop fevers (Temperature >100.4), chills, worsening symptoms or symptoms lasting longer than 10 days return to clinic.     If your cough lasts more than 6 weeks return to be re-evaluated.

## 2019-01-13 ENCOUNTER — Ambulatory Visit: Payer: Medicare HMO | Admitting: Primary Care

## 2019-01-18 ENCOUNTER — Telehealth: Payer: Self-pay

## 2019-01-18 NOTE — Telephone Encounter (Signed)
Spoke to Ms. Hornstein regarding the postponement of the PREP at the [facility] due to the temporary closing at this time.  [facility].

## 2019-01-18 NOTE — Telephone Encounter (Signed)
Pt called to see what symptoms should be of concern with corona virus. Reviewed with pt the criteria we are using at this time; pt does not have symptoms, has not traveled and no known exposure to covid or flu. Pt said she was just concerned and will cb if needed. Nothing further needed at this time.

## 2019-01-25 ENCOUNTER — Ambulatory Visit (INDEPENDENT_AMBULATORY_CARE_PROVIDER_SITE_OTHER): Payer: Medicare HMO | Admitting: Primary Care

## 2019-01-25 ENCOUNTER — Other Ambulatory Visit: Payer: Self-pay

## 2019-01-25 VITALS — BP 120/76 | HR 98 | Temp 98.0°F | Ht 66.0 in | Wt 223.2 lb

## 2019-01-25 DIAGNOSIS — L03113 Cellulitis of right upper limb: Secondary | ICD-10-CM

## 2019-01-25 MED ORDER — CEPHALEXIN 500 MG PO CAPS: 500.0000 mg | ORAL_CAPSULE | Freq: Two times a day (BID) | ORAL | 0 refills | Status: DC

## 2019-01-25 NOTE — Progress Notes (Signed)
Subjective:    Patient ID: [name], female    DOB: June 13, 1951, 68 y.o.   MRN: [id number]  HPI  [name] is a 68 year old female who presents today with a chief complaint of insect bite.  She believes she was bitten to the right axilla and right shoulder while sleeping Monday night. Woke up Tuesday morning and noticed erythema, itchy, warmth to those sites, increased in redness and size since. She's taken Benadryl, applied alcohol with cortisone cream without relief. She does have a pet cat who doesn't have fleas. Her partner doesn't have any symptoms. She's not tried any new medications/food/unlaundered clothing. She washed her bed sheets several days prior. She's mostly been indoors. She denies fevers, chills, numbness/tingling.   Review of Systems  Constitutional: Negative for fever.  Respiratory: Negative for shortness of breath.   Skin: Positive for color change.       Past Medical History:  Diagnosis Date  . Allergy   . Anxiety   . Arthritis   . Fibroids    Uterine  . Heart murmur   . Hyperlipidemia   . Overweight      Social History   Socioeconomic History  . Marital status: Single    Spouse name: Not on file  . Number of children: Not on file  . Years of education: Not on file  . Highest education level: Not on file  Occupational History  . Not on file  Social Needs  . Financial resource strain: Not on file  . Food insecurity:    Worry: Not on file    Inability: Not on file  . Transportation needs:    Medical: Not on file    Non-medical: Not on file  Tobacco Use  . Smoking status: Former Smoker    Years: 25.00    Types: Cigarettes  . Smokeless tobacco: Never Used  . Tobacco comment: smoked off and on for about 25 years  Substance and Sexual Activity  . Alcohol use: Yes    Alcohol/week: 0.0 standard drinks    Comment: seldom  . Drug use: No  . Sexual activity: Not on file  Lifestyle  . Physical activity:    Days per week: Not on file    Minutes per session: Not on file  . Stress: Not on file  Relationships  . Social connections:    Talks on phone: Not on file    Gets together: Not on file    Attends religious service: Not on file    Active member of club or organization: Not on file    Attends meetings of clubs or organizations: Not on file    Relationship status: Not on file  . Intimate partner violence:    Fear of current or ex partner: Not on file    Emotionally abused: Not on file    Physically abused: Not on file    Forced sexual activity: Not on file  Other Topics Concern  . Not on file  Social History Narrative   Pt lives in [place] with boyfriend of 21 years.   Retired Web designer    Past Surgical History:  Procedure Laterality Date  . NOSE SURGERY    . ROTATOR CUFF REPAIR    . TONSILLECTOMY      Family History  Problem Relation Age of Onset  . Stomach cancer Maternal Grandmother   . Colon cancer Maternal Uncle   . Esophageal cancer Neg Hx   . Rectal cancer Neg Hx   .  Breast cancer Neg Hx     No Known Allergies  Current Outpatient Medications on File Prior to Visit  Medication Sig Dispense Refill  . co-enzyme Q-10 30 MG capsule Take 30 mg by mouth 3 (three) times daily.    . Multiple Vitamin (MULTIVITAMIN) tablet Take 1 tablet by mouth daily.    . Omega 3 1000 MG CAPS Take by mouth.    . vitamin B-12 (CYANOCOBALAMIN) 500 MCG tablet Take 500 mcg by mouth daily.    Marland Kitchen diltiazem (CARDIZEM CD) 120 MG 24 hr capsule Take 1 capsule (120 mg total) by mouth daily. 90 capsule 3   No current facility-administered medications on file prior to visit.     BP 120/76   Pulse 98   Temp 98 F (36.7 C) (Oral)   Ht 5\' 6"  (1.676 m)   Wt 223 lb 4 oz (101.3 kg)   LMP 04/28/2012   SpO2 98%   BMI 36.03 kg/m    Objective:   Physical Exam  Constitutional: She appears well-nourished.  Cardiovascular: Normal rate.  Respiratory: Effort normal.  Skin: Skin is warm and dry. There is  erythema.     4.5 cm X 2 cm raised, erythematous, slightly firm, circular spot with point of entry to right axillary area of right arm. 2 X 1 cm raised  Spot to right posterior shoulder with same characteristics. Very small spot to right upper thoracic back with same characteristics.             Assessment & Plan:  Cellulitis:  Likely insect bite to area of concern, now with obvious early cellulitis.  Rx for Cephalexin course sent to pharmacy. Discussed other symptomatic care including oral and topical agents. Cool compresses for discomfort.  Return precautions provided.   Pleas Koch, NP

## 2019-01-25 NOTE — Patient Instructions (Addendum)
Start Cephalexin antibiotics for the infection. Take 1 capsule by mouth twice daily for 7 days.  You can take Benadryl for itching and apply hydrocortisone for itching.   Please update me if no improvement in 3-4 days.  It was a pleasure to see you today!

## 2019-01-27 ENCOUNTER — Telehealth: Payer: Self-pay

## 2019-01-27 NOTE — Telephone Encounter (Signed)
IBC received from patient stating that she was still experiencing pain, itching, and redness on raised areas on right upper extremity. Notified PCP.   Patient was advised to continue antibiotics as prescribed, take Benadryl OTC, and apply hydrocortisone topically for itching. Patient verbalized understanding.

## 2019-03-06 ENCOUNTER — Telehealth: Payer: Self-pay

## 2019-03-06 NOTE — Telephone Encounter (Signed)
Spoke with pt regarding appt on 03/07/19. Pt was advise to check vitals prior to appt. Pt questions and concerns were address.

## 2019-03-07 ENCOUNTER — Telehealth (INDEPENDENT_AMBULATORY_CARE_PROVIDER_SITE_OTHER): Payer: Medicare HMO | Admitting: Internal Medicine

## 2019-03-07 ENCOUNTER — Other Ambulatory Visit: Payer: Self-pay

## 2019-03-07 VITALS — BP 127/75 | HR 71 | Wt 217.0 lb

## 2019-03-07 DIAGNOSIS — E663 Overweight: Secondary | ICD-10-CM

## 2019-03-07 DIAGNOSIS — I480 Paroxysmal atrial fibrillation: Secondary | ICD-10-CM | POA: Diagnosis not present

## 2019-03-07 DIAGNOSIS — G4733 Obstructive sleep apnea (adult) (pediatric): Secondary | ICD-10-CM | POA: Diagnosis not present

## 2019-03-07 NOTE — Progress Notes (Signed)
Electrophysiology TeleHealth Note   Due to national recommendations of social distancing due to [place] 19, an audio  telehealth visit is felt to be most appropriate for this patient at this time.  See MyChart message from today for the patient's consent to telehealth for [facility].  She could not figure out virtual visits and therefore cannot do a video visit today   Date:  03/07/2019   ID:  [name], DOB 03/29/1951, MRN [id number]  Location: patient's home  Provider location: [place]  Evaluation Performed: Follow-up visit  PCP:  Pleas Koch, NP  Cardiologist:  No primary care provider on file.  Electrophysiologist:  Dr Rayann Heman  Chief Complaint:  afib  History of Present Illness:    [name] is a 68 y.o. female who presents via audio conferencing for a telehealth visit today.  Since last being seen in our clinic, the patient reports doing very well.  Her arrhythmia is well controlled.  She feels concerned that she is on too many pills.  She is only on low dose diltiazem but about 7 supplements. Today, she denies symptoms of palpitations, chest pain, shortness of breath,  lower extremity edema, dizziness, presyncope, or syncope.  The patient is otherwise without complaint today.  The patient denies symptoms of fevers, chills, cough, or new SOB worrisome for COVID 19.  Past Medical History:  Diagnosis Date  . Allergy   . Anxiety   . Arthritis   . Fibroids    Uterine  . Heart murmur   . Hyperlipidemia   . Overweight     Past Surgical History:  Procedure Laterality Date  . NOSE SURGERY    . ROTATOR CUFF REPAIR    . TONSILLECTOMY      Current Outpatient Medications  Medication Sig Dispense Refill  . Ascorbic Acid (VITAMIN C PO) Take 1 tablet by mouth daily.    Marland Kitchen BIOTIN PO Take 1 tablet by mouth daily.    . Calcium-Magnesium (CAL-MAG PO) Take 1 tablet by mouth daily.    Marland Kitchen co-enzyme Q-10 30 MG capsule Take 30 mg by mouth 3 (three) times  daily.    Marland Kitchen diltiazem (CARDIZEM) 30 MG tablet Take 30 mg by mouth as needed (heartrate).    . Multiple Vitamin (MULTIVITAMIN) tablet Take 1 tablet by mouth daily.    . Omega 3 1000 MG CAPS Take 1 capsule by mouth daily.     . vitamin B-12 (CYANOCOBALAMIN) 500 MCG tablet Take 500 mcg by mouth daily.    Marland Kitchen diltiazem (CARDIZEM CD) 120 MG 24 hr capsule Take 1 capsule (120 mg total) by mouth daily. 90 capsule 3   No current facility-administered medications for this visit.     Allergies:   Patient has no known allergies.   Social History:  The patient  reports that she has quit smoking. Her smoking use included cigarettes. She quit after 25.00 years of use. She has never used smokeless tobacco. She reports current alcohol use. She reports that she does not use drugs.   Family History:  The patient's family history includes Colon cancer in her maternal uncle; Stomach cancer in her maternal grandmother.   ROS:  Please see the history of present illness.   All other systems are personally reviewed and negative.    Exam:    Vital Signs:  BP 127/75   Pulse 71   Wt 217 lb (98.4 kg)   LMP 04/28/2012   BMI 35.02 kg/m  Anxious sounding   Labs/Other Tests and Data Reviewed:    Recent Labs: 10/07/2018: ALT 14; BUN 24; Creatinine, Ser 0.90; Potassium 4.2; Sodium 141; TSH 2.01   Wt Readings from Last 3 Encounters:  03/07/19 217 lb (98.4 kg)  01/25/19 223 lb 4 oz (101.3 kg)  01/12/19 223 lb 12 oz (101.5 kg)     Other studies personally reviewed: Additional studies/ records that were reviewed today include: my prior notes  Review of the above records today demonstrates: as above     ASSESSMENT & PLAN:    1.  Paroxysmal atrial  Fibrillation Seems to be well controlled She wishes to stop diltiazem at this time She has short acting diltiazem that she can take prn chads2vasc score is 2.  She does not require AAD currently  2. Obesity Lifestyle modification is encouraged I have  discouraged weight loss pills  3. Mild OSA She is panned to have CPAP titration study after COVID 19  4. COVID 19 screen The patient denies symptoms of COVID 19 at this time.  The importance of social distancing was discussed today.  Follow-up:  6 months in the AF clinic  Current medicines are reviewed at length with the patient today.   The patient does not have concerns regarding her medicines.  The following changes were made today:  none  Labs/ tests ordered today include:  No orders of the defined types were placed in this encounter.  Patient Risk:  after full review of this patients clinical status, I feel that they are at moderate risk at this time.  Today, I have spent 22 minutes with the patient with telehealth technology discussing afib and lifestyle modification .    Army Fossa, MD  03/07/2019 10:28 AM     [address] [place] [address] [phone] (office) [phone] (fax)

## 2019-03-07 NOTE — Telephone Encounter (Signed)
F/UMessage           Patient is having trouble getting on and would like a call [phone]

## 2019-03-14 ENCOUNTER — Ambulatory Visit (HOSPITAL_COMMUNITY): Payer: Medicare HMO | Admitting: Nurse Practitioner

## 2019-03-16 ENCOUNTER — Ambulatory Visit (HOSPITAL_COMMUNITY): Payer: Medicare HMO | Admitting: Nurse Practitioner

## 2019-04-05 ENCOUNTER — Ambulatory Visit (INDEPENDENT_AMBULATORY_CARE_PROVIDER_SITE_OTHER): Payer: Medicare HMO | Admitting: Primary Care

## 2019-04-05 ENCOUNTER — Encounter: Payer: Self-pay | Admitting: Primary Care

## 2019-04-05 ENCOUNTER — Other Ambulatory Visit: Payer: Self-pay

## 2019-04-05 ENCOUNTER — Telehealth: Payer: Self-pay

## 2019-04-05 VITALS — BP 124/80 | HR 78 | Temp 97.8°F | Ht 66.0 in | Wt 218.5 lb

## 2019-04-05 DIAGNOSIS — H1033 Unspecified acute conjunctivitis, bilateral: Secondary | ICD-10-CM | POA: Diagnosis not present

## 2019-04-05 HISTORY — DX: Unspecified acute conjunctivitis, bilateral: H10.33

## 2019-04-05 MED ORDER — POLYMYXIN B-TRIMETHOPRIM 10000-0.1 UNIT/ML-% OP SOLN: 1.0000 [drp] | Freq: Four times a day (QID) | OPHTHALMIC | 0 refills | Status: DC

## 2019-04-05 NOTE — Addendum Note (Signed)
Addended by: Jacqualin Combes on: 04/05/2019 11:21 AM   Modules accepted: Orders

## 2019-04-05 NOTE — Assessment & Plan Note (Signed)
Likely bacterial at this point, doesn't appear to be allergic. Given progression in symptoms with spread to right eye we will treat with antibiotic drops. Rx for trimethoprim-polymixin B drops sent to pharmacy. Instructions provided for use.   Discussed home care instructions.

## 2019-04-05 NOTE — Telephone Encounter (Signed)
Will evaluate patient as scheduled.  

## 2019-04-05 NOTE — Telephone Encounter (Signed)
[facility] Night - Client TELEPHONE ADVICE RECORD [facility] Patient Name: [name] Gender: Female DOB: Nov 19, 1950 Age: 68 Y 70 M 3 D Return Phone Number: [phone] (Primary) Address: City/State/Zip: [place] [address] Client [facility] Night - Client Client Site [place] Physician Alma Friendly - NP Contact Type Call Who Is Calling Patient / Member / Family / Caregiver Call Type Triage / Clinical Relationship To Patient Self Return Phone Number [phone] (Primary) Chief Complaint Eye Pus Or Discharge Reason for Call Symptomatic / Request for [place] has rus in the corner of her eyes, has been happening for 4 days now. Translation No Nurse Assessment Guidelines Guideline Title Affirmed Question Affirmed Notes Nurse Date/Time (Eastern Time) Disp. Time Eilene Ghazi Time) Disposition Final User 04/05/2019 7:56:31 AM Send To RN Personal Cox, RN, [place] 11/08/107 3:23:55 AM Attempt made - message left Harlow Mares, RN, Rhonda 04/05/2019 8:21:13 AM Attempt made - no message left Ferd Glassing 04/05/2019 8:32:20 AM FINAL ATTEMPT MADE - message left Yes Harlow Mares, RN, Suanne Marker

## 2019-04-05 NOTE — Telephone Encounter (Signed)
Pt already has in office visit scheduled 04/05/19 at 10:40 with Gentry Fitz NP.

## 2019-04-05 NOTE — Progress Notes (Signed)
Subjective:    Patient ID: [name], female    DOB: 10/02/1951, 68 y.o.   MRN: [id number]  HPI  [name] is a 68 year old female who presents today with a chief complaint of eye irritation.  The irritation is located to the left eye. She reports itching, whitish/grey discharge, crusting to the eye lashes when waking and throughout the day. Symptoms began about 4 days ago, she now experiencing symptoms to the right eye. She's applied saline eye drops without much improvement. She denies cough, sneezing, post nasal drip, fevers, rhinorrhea.   Review of Systems  HENT: Negative for congestion, postnasal drip, rhinorrhea, sinus pressure and sore throat.   Eyes: Positive for discharge, redness and itching. Negative for visual disturbance.       Past Medical History:  Diagnosis Date  . Allergy   . Anxiety   . Arthritis   . Fibroids    Uterine  . Heart murmur   . Hyperlipidemia   . Overweight      Social History   Socioeconomic History  . Marital status: Single    Spouse name: Not on file  . Number of children: Not on file  . Years of education: Not on file  . Highest education level: Not on file  Occupational History  . Not on file  Social Needs  . Financial resource strain: Not on file  . Food insecurity:    Worry: Not on file    Inability: Not on file  . Transportation needs:    Medical: Not on file    Non-medical: Not on file  Tobacco Use  . Smoking status: Former Smoker    Years: 25.00    Types: Cigarettes  . Smokeless tobacco: Never Used  . Tobacco comment: smoked off and on for about 25 years  Substance and Sexual Activity  . Alcohol use: Yes    Alcohol/week: 0.0 standard drinks    Comment: seldom  . Drug use: No  . Sexual activity: Not on file  Lifestyle  . Physical activity:    Days per week: Not on file    Minutes per session: Not on file  . Stress: Not on file  Relationships  . Social connections:    Talks on phone: Not on file    Gets  together: Not on file    Attends religious service: Not on file    Active member of club or organization: Not on file    Attends meetings of clubs or organizations: Not on file    Relationship status: Not on file  . Intimate partner violence:    Fear of current or ex partner: Not on file    Emotionally abused: Not on file    Physically abused: Not on file    Forced sexual activity: Not on file  Other Topics Concern  . Not on file  Social History Narrative   Pt lives in [place] with boyfriend of 21 years.   Retired Web designer    Past Surgical History:  Procedure Laterality Date  . NOSE SURGERY    . ROTATOR CUFF REPAIR    . TONSILLECTOMY      Family History  Problem Relation Age of Onset  . Stomach cancer Maternal Grandmother   . Colon cancer Maternal Uncle   . Esophageal cancer Neg Hx   . Rectal cancer Neg Hx   . Breast cancer Neg Hx     No Known Allergies  Current Outpatient Medications on File Prior  to Visit  Medication Sig Dispense Refill  . Ascorbic Acid (VITAMIN C PO) Take 1 tablet by mouth daily.    Marland Kitchen BIOTIN PO Take 1 tablet by mouth daily.    . Calcium-Magnesium (CAL-MAG PO) Take 1 tablet by mouth daily.    Marland Kitchen co-enzyme Q-10 30 MG capsule Take 30 mg by mouth 3 (three) times daily.    Marland Kitchen diltiazem (CARDIZEM) 30 MG tablet Take 30 mg by mouth as needed (heartrate).    . Multiple Vitamin (MULTIVITAMIN) tablet Take 1 tablet by mouth daily.    . Omega 3 1000 MG CAPS Take 1 capsule by mouth daily.      No current facility-administered medications on file prior to visit.     BP 124/80   Pulse 78   Temp 97.8 F (36.6 C) (Tympanic)   Ht 5\' 6"  (1.676 m)   Wt 218 lb 8 oz (99.1 kg)   LMP 04/28/2012   SpO2 98%   BMI 35.27 kg/m    Objective:   Physical Exam  Constitutional: She appears well-nourished.  Eyes: Right eye exhibits discharge. Left eye exhibits discharge. Right conjunctiva is not injected. Left conjunctiva is injected.  Whitish drainage  to bilateral eyes with crusting. Mild injection to left eye.   Respiratory: Effort normal. No respiratory distress.  Skin: Skin is warm and dry.           Assessment & Plan:

## 2019-04-05 NOTE — Patient Instructions (Signed)
Use the antibiotic eye drops. Instill 1 drop into both eyes four times daily for about one week. Continue two days after your symptoms resolve to prevent re-infection.  It was a pleasure to see you today!   Bacterial Conjunctivitis, Adult Bacterial conjunctivitis is an infection of your conjunctiva. This is the clear membrane that covers the white part of your eye and the inner part of your eyelid. This infection can make your eye:  Red or pink.  Itchy. This condition spreads easily from person to person (is contagious) and from one eye to the other eye. What are the causes?  This condition is caused by germs (bacteria). You may get the infection if you come into close contact with: ? A person who has the infection. ? Items that have germs on them (are contaminated), such as face towels, contact lens solution, or eye makeup. What increases the risk? You are more likely to get this condition if you:  Have contact with people who have the infection.  Wear contact lenses.  Have a sinus infection.  Have had a recent eye injury or surgery.  Have a weak body defense system (immune system).  Have dry eyes. What are the signs or symptoms?   Thick, yellowish discharge from the eye.  Tearing or watery eyes.  Itchy eyes.  Burning feeling in your eyes.  Eye redness.  Swollen eyelids.  Blurred vision. How is this treated?   Antibiotic eye drops or ointment.  Antibiotic medicine taken by mouth. This is used for infections that do not get better with drops or ointment or that last more than 10 days.  Cool, wet cloths placed on the eyes.  Artificial tears used 2-6 times a day. Follow these instructions at home: Medicines  Take or apply your antibiotic medicine as told by your doctor. Do not stop taking or applying the antibiotic even if you start to feel better.  Take or apply over-the-counter and prescription medicines only as told by your doctor.  Do not touch your  eyelid with the eye-drop bottle or the ointment tube. Managing discomfort  Wipe any fluid from your eye with a warm, wet washcloth or a cotton ball.  Place a clean, cool, wet cloth on your eye. Do this for 10-20 minutes, 3-4 times per day. General instructions  Do not wear contacts until the infection is gone. Wear glasses until your doctor says it is okay to wear contacts again.  Do not wear eye makeup until the infection is gone. Throw away old eye makeup.  Change or wash your pillowcase every day.  Do not share towels or washcloths.  Wash your hands often with soap and water. Use paper towels to dry your hands.  Do not touch or rub your eyes.  Do not drive or use heavy machinery if your vision is blurred. Contact a doctor if:  You have a fever.  You do not get better after 10 days. Get help right away if:  You have a fever and your symptoms get worse all of a sudden.  You have very bad pain when you move your eye.  Your face: ? Hurts. ? Is red. ? Is swollen.  You have sudden loss of vision. Summary  Bacterial conjunctivitis is an infection of your conjunctiva.  This infection spreads easily from person to person.  Wash your hands often with soap and water. Use paper towels to dry your hands.  Take or apply your antibiotic medicine as told by your doctor.  Contact a doctor if you have a fever or you do not get better after 10 days. This information is not intended to replace advice given to you by your health care provider. Make sure you discuss any questions you have with your health care provider. Document Released: 07/28/2008 Document Revised: 05/25/2018 Document Reviewed: 05/25/2018 Elsevier Interactive Patient Education  2019 [employer].

## 2019-04-24 ENCOUNTER — Ambulatory Visit: Payer: Medicare HMO | Admitting: Pulmonary Disease

## 2019-04-27 ENCOUNTER — Telehealth: Payer: Self-pay | Admitting: Primary Care

## 2019-04-27 NOTE — Telephone Encounter (Signed)
error 

## 2019-05-08 ENCOUNTER — Telehealth: Payer: Self-pay | Admitting: Primary Care

## 2019-05-08 NOTE — Telephone Encounter (Signed)
Does she have an eye doctor? I recommend she call her eye doctor for an exam or we can get her set up with someone.

## 2019-05-08 NOTE — Telephone Encounter (Signed)
Patient stated that at her last visit about her eye she was prescribed medication. She stated that she is still having trouble with her eye.and the medication does not seem to be helping. There is still puss coming out of her eyes and they are still crusting over/sticking together .  She stated she is really starting to get concerned Patient would like to know if you believe that she should be referred to a specialist   Patient's C/B #  [phone]

## 2019-05-09 NOTE — Telephone Encounter (Signed)
Spoken and notified patient of [name] comments. Patient stated that she will call her eye doctor.

## 2019-05-09 NOTE — Telephone Encounter (Signed)
Noted  

## 2019-05-11 DIAGNOSIS — H1045 Other chronic allergic conjunctivitis: Secondary | ICD-10-CM | POA: Diagnosis not present

## 2019-05-11 DIAGNOSIS — H04123 Dry eye syndrome of bilateral lacrimal glands: Secondary | ICD-10-CM | POA: Diagnosis not present

## 2019-05-11 DIAGNOSIS — H16222 Keratoconjunctivitis sicca, not specified as Sjogren's, left eye: Secondary | ICD-10-CM | POA: Diagnosis not present

## 2019-05-11 DIAGNOSIS — D23111 Other benign neoplasm of skin of right upper eyelid, including canthus: Secondary | ICD-10-CM | POA: Diagnosis not present

## 2019-05-11 DIAGNOSIS — H2511 Age-related nuclear cataract, right eye: Secondary | ICD-10-CM | POA: Diagnosis not present

## 2019-05-11 DIAGNOSIS — S0502XS Injury of conjunctiva and corneal abrasion without foreign body, left eye, sequela: Secondary | ICD-10-CM | POA: Diagnosis not present

## 2019-05-11 DIAGNOSIS — H25812 Combined forms of age-related cataract, left eye: Secondary | ICD-10-CM | POA: Diagnosis not present

## 2019-05-11 DIAGNOSIS — H11823 Conjunctivochalasis, bilateral: Secondary | ICD-10-CM | POA: Diagnosis not present

## 2019-05-11 DIAGNOSIS — H1012 Acute atopic conjunctivitis, left eye: Secondary | ICD-10-CM | POA: Diagnosis not present

## 2019-08-24 ENCOUNTER — Telehealth: Payer: Self-pay | Admitting: Primary Care

## 2019-08-24 NOTE — Telephone Encounter (Signed)
Fine with me, very nice patient!

## 2019-08-24 NOTE — Telephone Encounter (Signed)
Patient has moved close to the [facility]. Patient was very satisfied with Alma Friendly but needs to relocate due to her moving. She would like to establish care with Dr. Raoul Pitch.

## 2019-08-25 NOTE — Telephone Encounter (Signed)
[place] with me. Please get her scheduled in NewPt slot. Thanks.

## 2019-08-31 ENCOUNTER — Telehealth: Payer: Self-pay | Admitting: Primary Care

## 2019-08-31 NOTE — Telephone Encounter (Signed)
Noted and appreciate her kind comments.

## 2019-08-31 NOTE — Telephone Encounter (Signed)
Patient called to Thank You for everything that you have done for her. She has moved to [place] now and is transferring to [employer], she just wanted you to know and to say Thanks.

## 2019-09-12 ENCOUNTER — Encounter: Payer: Medicare HMO | Admitting: Family Medicine

## 2019-09-15 ENCOUNTER — Encounter: Payer: Medicare HMO | Admitting: Family Medicine

## 2019-10-02 ENCOUNTER — Other Ambulatory Visit: Payer: Self-pay

## 2019-10-03 ENCOUNTER — Ambulatory Visit (INDEPENDENT_AMBULATORY_CARE_PROVIDER_SITE_OTHER): Payer: Medicare HMO | Admitting: Family Medicine

## 2019-10-03 ENCOUNTER — Encounter: Payer: Self-pay | Admitting: Family Medicine

## 2019-10-03 ENCOUNTER — Other Ambulatory Visit: Payer: Self-pay

## 2019-10-03 VITALS — BP 110/73 | HR 66 | Temp 97.6°F | Resp 18 | Ht 66.0 in | Wt 195.4 lb

## 2019-10-03 DIAGNOSIS — L299 Pruritus, unspecified: Secondary | ICD-10-CM | POA: Diagnosis not present

## 2019-10-03 DIAGNOSIS — I480 Paroxysmal atrial fibrillation: Secondary | ICD-10-CM | POA: Diagnosis not present

## 2019-10-03 DIAGNOSIS — N3946 Mixed incontinence: Secondary | ICD-10-CM

## 2019-10-03 DIAGNOSIS — R32 Unspecified urinary incontinence: Secondary | ICD-10-CM | POA: Insufficient documentation

## 2019-10-03 DIAGNOSIS — Z789 Other specified health status: Secondary | ICD-10-CM | POA: Insufficient documentation

## 2019-10-03 MED ORDER — HYDROXYZINE PAMOATE 25 MG PO CAPS: 25.0000 mg | ORAL_CAPSULE | Freq: Every day | ORAL | 5 refills | Status: DC

## 2019-10-03 NOTE — Progress Notes (Signed)
Patient ID: [name], female  DOB: 09/24/1951, 68 y.o.   MRN: [id number] Patient Care Team    Relationship Specialty Notifications Start End  Ma Hillock, DO PCP - General Family Medicine  10/03/19   Thompson Grayer, MD Consulting Physician Cardiology  10/04/19   Warden Fillers, MD Consulting Physician Ophthalmology  10/04/19     Chief Complaint  Patient presents with  . Establish Care    Does not need medication refills. Pt is itching all over in the evening, esp on her head.     Subjective:  [name] is a 68 y.o.  female present for TOC. All past medical history, surgical history, allergies, family history, immunizations, medications and social history were updated in the electronic medical record today. All recent labs, ED visits and hospitalizations within the last year were reviewed.  Patient presents today to discuss an itchy rash that has been present for at least a few years.  She states she has been bothered by it, but she has been dealing with it until now.  She would like to try something to help with her symptoms.  She states she is itchy everywhere except for her legs.  Her scalp is extremely itchy.  She denies any dry flakiness of her scalp or skin.  She reports no changes in her personal hygiene products over that time or laundry detergent.  She has tried Benadryl and Cetaphil.  Has a history of atrial fibrillation and followed by Dr. Rayann Heman.  She states she is not taking the medication that had been prescribed because she feels well.  She has lost weight, and she contributes that to her overall improvement of condition.  She does have follow-up with Dr. Rayann Heman coming up.  She also endorses decreased sleep at night.  She is waking because she has itchiness and she has to get up 2-3 times a night to urinate.  She endorses urinary leakage with urgency and stress incontinence.  She states she does wear pads daily.  She reports she has tried exercising  routinely and losing weight to help with her sleep pattern.  She contributed some of her decreased sleep to possible situational stress after losing a few family members.  She states she started Bible study 2 times a week and that has also helped.   Depression screen [facility] 2/9 10/03/2019 10/03/2018  Decreased Interest 0 0  Down, Depressed, Hopeless 0 0  PHQ - 2 Score 0 0   GAD 7 : Generalized Anxiety Score 01/06/2016  Nervous, Anxious, on Edge 3  Control/stop worrying 1  Worry too much - different things 1  Trouble relaxing 1  Restless 1  Easily annoyed or irritable 0  Afraid - awful might happen 0  Total GAD 7 Score 7       Fall Risk  10/03/2018  Falls in the past year? 1  Number falls in past yr: 0  Injury with Fall? 1    Immunization History  Administered Date(s) Administered  . Influenza,inj,Quad PF,6+ Mos 10/03/2018  . Pneumococcal Polysaccharide-23 12/06/2018  . Tdap 07/18/2015  . Zoster 11/03/2015    No exam data present  Past Medical History:  Diagnosis Date  . Acute conjunctivitis of both eyes 04/05/2019  . Allergy   . Anxiety   . Arthritis   . Chicken pox   . Colon polyp   . Essential hypertension 10/03/2018  . Fibroids    Uterine  . GERD (gastroesophageal reflux disease)   .  Heart murmur   . History of colon polyps   . Hyperlipidemia   . Overweight    No Known Allergies Past Surgical History:  Procedure Laterality Date  . NOSE SURGERY    . ROTATOR CUFF REPAIR    . TONSILLECTOMY  1970  . WISDOM TOOTH EXTRACTION     Family History  Problem Relation Age of Onset  . Hearing loss Mother   . Macular degeneration Mother   . Alzheimer's disease Father   . Hypertension Father   . Heart disease Father   . Stomach cancer Maternal Grandmother   . Ovarian cancer Maternal Grandmother   . Emphysema Maternal Grandfather   . Colon cancer Maternal Uncle   . Lung cancer Sister   . Heart disease Brother   . Esophageal cancer Neg Hx   . Rectal cancer Neg Hx   .  Breast cancer Neg Hx    Social History   Social History Narrative   Marital status/children/pets: Single.    Education/employment: retired Librarian, academic:      -smoke alarm in the home:Yes     - wears seatbelt: Yes     - Feels safe in their relationships: Yes    Allergies as of 10/03/2019   No Known Allergies     Medication List       Accurate as of October 03, 2019 11:59 PM. If you have any questions, ask your nurse or doctor.        STOP taking these medications   diltiazem 30 MG tablet Commonly known as: CARDIZEM Stopped by: Howard Pouch, DO   trimethoprim-polymyxin b ophthalmic solution Commonly known as: POLYTRIM Stopped by: Howard Pouch, DO     TAKE these medications   BIOTIN PO Take 1 tablet by mouth daily.   CAL-MAG PO Take 1 tablet by mouth daily.   co-enzyme Q-10 30 MG capsule Take 30 mg by mouth 3 (three) times daily.   DIALYVITE VITAMIN D 5000 PO Take 1 tablet by mouth daily.   hydrOXYzine 25 MG capsule Commonly known as: Vistaril Take 1-2 capsules (25-50 mg total) by mouth at bedtime. Started by: Caroll Rancher, LPN   multivitamin tablet Take 1 tablet by mouth daily.   Omega 3 1000 MG Caps Take 1 capsule by mouth daily.   VITAMIN B 12 PO Take 1 tablet by mouth daily.   VITAMIN C PO Take 1 tablet by mouth daily.       All past medical history, surgical history, allergies, family history, immunizations andmedications were updated in the EMR today and reviewed under the history and medication portions of their EMR.    No results found for this or any previous visit (from the past 2160 hour(s)).   ROS: 14 pt review of systems performed and negative (unless mentioned in an HPI)  Objective: BP 110/73 (BP Location: Left Arm, Patient Position: Sitting, Cuff Size: Normal)   Pulse 66   Temp 97.6 F (36.4 C) (Temporal)   Resp 18   Ht 5\' 6"  (1.676 m)   Wt 195 lb 6 oz (88.6 kg)   LMP 04/28/2012   SpO2 98%   BMI 31.53  kg/m  Gen: Afebrile. No acute distress. Nontoxic in appearance, well-developed, well-nourished, pleasant, obese Caucasian female. HENT: AT. [place].  No cough.  No hoarseness. Eyes:Pupils Equal Round Reactive to light, Extraocular movements intact,  Conjunctiva without redness, discharge or icterus. Neck/lymp/endocrine: Supple, no lymphadenopathy CV: RRR, no edema Chest: CTAB, no wheeze, rhonchi or crackles.  Skin: no rashes, purpura or petechiae. Warm and well-perfused. Mildly dry skin on arms. Scalp without dryness or flakiness. Skin intact. Neuro/Msk:  Normal gait. PERLA. EOMi. Alert. Oriented x3.   Psych: Normal affect, dress and demeanor. Normal speech. Normal thought content and judgment.  Assessment/plan: [name] is a 68 y.o. female present for [facility] Mixed stress and urge urinary incontinence Lengthy discussion today surrounding her urinary incontinence and different types of incontinence.  She does seem to have a mixed incontinence of urgency and stress, with urgency being be most bothersome to her. -Briefly discussed different medication options, and she would first like to start with behavioral options. -Encouraged her to make it routine to use the bathroom every 2-3 hours to empty her bladder. -Hydration is very important, however she should attempt to avoid excessive fluid consumption after 6 PM.  Ensure to use the bathroom before going to bed. - discussed bladder irritation causes- constipation, carbonated or acidic/spicy foods/fluids.  -If symptoms become more bothersome, would refer her for formal urinary studies, pelvic floor PT and/or consider bladder antispasmodic trial. - AVS education on urinary incontinence provided.  - f/u PRN  Pruritus Uncertain cause of her pruritus.  Has been going on for years.  She does have some mildly dry skin on her arms and legs, but her scalp does not appear particularly dry. -Courage her to continue using Cetaphil cream, especially after  showering. -Start Vistaril 1-2 tabs before bed.  Paroxysmal atrial fibrillation (HCC) Has follow-up with Dr. Rayann Heman.  Currently not taking medication per patient preference.  Return if symptoms worsen or fail to improve.  And for annual exam when due.  > 25 minutes spent with patient, >50% of time spent face to face   This visit occurred during the SARS-CoV-2 public health emergency.  Safety protocols were in place, including screening questions prior to the visit, additional usage of staff PPE, and extensive cleaning of exam room while observing appropriate contact time as indicated for disinfecting solutions.    Note is dictated utilizing voice recognition software. Although note has been proof read prior to signing, occasional typographical errors still can be missed. If any questions arise, please do not hesitate to call for verification.  Electronically signed by: Howard Pouch, DO [place]

## 2019-10-03 NOTE — Patient Instructions (Addendum)
Pleasure to meet you today.  Continue the Cetaphil cream. Use after shower daily.  Start vistaril (hydroxyzine) before bed ( 1-2 tabs) .  If urinary symptoms are not controlled or worsened, then we would refer you to urology or gynecology for urinary studies.  Follow up on condition above as needed or every 6 months if needing refills.  Yearly medicare wellness/preventive.    Urinary Incontinence  Urinary incontinence refers to a condition in which a person is unable to control where and when to pass urine. A person with this condition will urinate when he or she does not mean to (involuntarily). What are the causes? This condition may be caused by:  Medicines.  Infections.  Constipation.  Overactive bladder muscles.  Weak bladder muscles.  Weak pelvic floor muscles. These muscles provide support for the bladder, intestine, and, in women, the uterus.  Enlarged prostate in men. The prostate is a gland near the bladder. When it gets too big, it can pinch the urethra. With the urethra blocked, the bladder can weaken and lose the ability to empty properly.  Surgery.  Emotional factors, such as anxiety, stress, or post-traumatic stress disorder (PTSD).  Pelvic organ prolapse. This happens in women when organs shift out of place and into the vagina. This shift can prevent the bladder and urethra from working properly. What increases the risk? The following factors may make you more likely to develop this condition:  Older age.  Obesity and physical inactivity.  Pregnancy and childbirth.  Menopause.  Diseases that affect the nerves or spinal cord (neurological diseases).  Long-term (chronic) coughing. This can increase pressure on the bladder and pelvic floor muscles. What are the signs or symptoms? Symptoms may vary depending on the type of urinary incontinence you have. They include:  A sudden urge to urinate, but passing urine involuntarily before you can get to a  bathroom (urge incontinence).  Suddenly passing urine with any activity that forces urine to pass, such as coughing, laughing, exercise, or sneezing (stress incontinence).  Needing to urinate often, but urinating only a small amount, or constantly dribbling urine (overflow incontinence).  Urinating because you cannot get to the bathroom in time due to a physical disability, such as arthritis or injury, or communication and thinking problems, such as Alzheimer disease (functional incontinence). How is this diagnosed? This condition may be diagnosed based on:  Your medical history.  A physical exam.  Tests, such as: ? Urine tests. ? X-rays of your kidney and bladder. ? Ultrasound. ? CT scan. ? Cystoscopy. In this procedure, a health care provider inserts a tube with a light and camera (cystoscope) through the urethra and into the bladder in order to check for problems. ? Urodynamic testing. These tests assess how well the bladder, urethra, and sphincter can store and release urine. There are different types of urodynamic tests, and they vary depending on what the test is measuring. To help diagnose your condition, your health care provider may recommend that you keep a log of when you urinate and how much you urinate. How is this treated? Treatment for this condition depends on the type of incontinence that you have and its cause. Treatment may include:  Lifestyle changes, such as: ? Quitting smoking. ? Maintaining a healthy weight. ? Staying active. Try to get 150 minutes of moderate-intensity exercise every week. Ask your health care provider which activities are safe for you. ? Eating a healthy diet.  Avoid high-fat foods, like fried foods.  Avoid refined carbohydrates  like white bread and white rice.  Limit how much alcohol and caffeine you drink.  Increase your fiber intake. Foods such as fresh fruits, vegetables, beans, and whole grains are healthy sources of fiber.  Pelvic  floor muscle exercises.  Bladder training, such as lengthening the amount of time between bathroom breaks, or using the bathroom at regular intervals.  Using techniques to suppress bladder urges. This can include distraction techniques or controlled breathing exercises.  Medicines to relax the bladder muscles and prevent bladder spasms.  Medicines to help slow or prevent the growth of a man's prostate.  Botox injections. These can help relax the bladder muscles.  Using pulses of electricity to help change bladder reflexes (electrical nerve stimulation).  For women, using a medical device to prevent urine leaks. This is a small, tampon-like, disposable device that is inserted into the urethra.  Injecting collagen or carbon beads (bulking agents) into the urinary sphincter. These can help thicken tissue and close the bladder opening.  Surgery. Follow these instructions at home: Lifestyle  Limit alcohol and caffeine. These can fill your bladder quickly and irritate it.  Keep yourself clean to help prevent odors and skin damage. Ask your doctor about special skin creams and cleansers that can protect the skin from urine.  Consider wearing pads or adult diapers. Make sure to change them regularly, and always change them right after experiencing incontinence. General instructions  Take over-the-counter and prescription medicines only as told by your health care provider.  Use the bathroom about every 3-4 hours, even if you do not feel the need to urinate. Try to empty your bladder completely every time. After urinating, wait a minute. Then try to urinate again.  Make sure you are in a relaxed position while urinating.  If your incontinence is caused by nerve problems, keep a log of the medicines you take and the times you go to the bathroom.  Keep all follow-up visits as told by your health care provider. This is important. Contact a health care provider if:  You have pain that gets  worse.  Your incontinence gets worse. Get help right away if:  You have a fever or chills.  You are unable to urinate.  You have redness in your groin area or down your legs. Summary  Urinary incontinence refers to a condition in which a person is unable to control where and when to pass urine.  This condition may be caused by medicines, infection, weak bladder muscles, weak pelvic floor muscles, enlargement of the prostate (in men), or surgery.  The following factors increase your risk for developing this condition: older age, obesity, pregnancy and childbirth, menopause, neurological diseases, and chronic coughing.  There are several types of urinary incontinence. They include urge incontinence, stress incontinence, overflow incontinence, and functional incontinence.  This condition is usually treated first with lifestyle and behavioral changes, such as quitting smoking, eating a healthier diet, and doing regular pelvic floor exercises. Other treatment options include medicines, bulking agents, medical devices, electrical nerve stimulation, or surgery. This information is not intended to replace advice given to you by your health care provider. Make sure you discuss any questions you have with your health care provider. Document Released: 11/26/2004 Document Revised: 10/29/2017 Document Reviewed: 01/28/2017 Elsevier Patient Education  [place].   Please help [place] help you:  We are honored you have chosen [place] for your Primary Care home. Below you will find basic instructions that you may need to access in  the future. Please help [place] help you by reading the instructions, which cover many of the frequent questions we experience.   Prescription refills and request:  -In order to allow more efficient response time, please call your pharmacy for all refills. They will forward the request electronically to [place]. This allows for the quickest possible response. Request left  on a nurse line can take longer to refill, since these are checked as time allows between office patients and other phone calls.  - refill request can take up to 3-5 working days to complete.  - If request is sent electronically and request is appropiate, it is usually completed in 1-2 business days.  - all patients will need to be seen routinely for all chronic medical conditions requiring prescription medications (see follow-up below). If you are overdue for follow up on your condition, you will be asked to make an appointment and we will call in enough medication to cover you until your appointment (up to 30 days).  - all controlled substances will require a face to face visit to request/refill.  - if you desire your prescriptions to go through a new pharmacy, and have an active script at original pharmacy, you will need to call your pharmacy and have scripts transferred to new pharmacy. This is completed between the pharmacy locations and not by your provider.    Results: If any images or labs were ordered, it can take up to 1 week to get results depending on the test ordered and the lab/facility running and resulting the test. - Normal or stable results, which do not need further discussion, may be released to your mychart immediately with attached note to you. A call may not be generated for normal results. Please make certain to sign up for mychart. If you have questions on how to activate your mychart you can call the front office.  - If your results need further discussion, our office will attempt to contact you via phone, and if unable to reach you after 2 attempts, we will release your abnormal result to your mychart with instructions.  - All results will be automatically released in mychart after 1 week.  - Your provider will provide you with explanation and instruction on all relevant material in your results. Please keep in mind, results and labs may appear confusing or abnormal to the  untrained eye, but it does not mean they are actually abnormal for you personally. If you have any questions about your results that are not covered, or you desire more detailed explanation than what was provided, you should make an appointment with your provider to do so.   Our office handles many outgoing and incoming calls daily. If we have not contacted you within 1 week about your results, please check your mychart to see if there is a message first and if not, then contact our office.  In helping with this matter, you help decrease call volume, and therefore allow [place] to be able to respond to patients needs more efficiently.   Acute office visits (sick visit):  An acute visit is intended for a new problem and are scheduled in shorter time slots to allow schedule openings for patients with new problems. This is the appropriate visit to discuss a new problem. Problems will not be addressed by phone call or Echart message. Appointment is needed if requesting treatment. In order to provide you with excellent quality medical care with proper time for you to explain your problem, have an  exam and receive treatment with instructions, these appointments should be limited to one new problem per visit. If you experience a new problem, in which you desire to be addressed, please make an acute office visit, we save openings on the schedule to accommodate you. Please do not save your new problem for any other type of visit, let us take care of it properly and quickly for you.   Follow up visits:  Depending on your condition(s) your provider will need to see you routinely in order to provide you with quality care and prescribe medication(s). Most chronic conditions (Example: hypertension, Diabetes, depression/anxiety... etc), require visits a couple times a year. Your provider will instruct you on proper follow up for your personal medical conditions and history. Please make certain to make follow up appointments for  your condition as instructed. Failing to do so could result in lapse in your medication treatment/refills. If you request a refill, and are overdue to be seen on a condition, we will always provide you with a 30 day script (once) to allow you time to schedule.    Medicare wellness (well visit): - we have a wonderful Nurse Maudie Mercury), that will meet with you and provide you will yearly medicare wellness visits. These visits should occur yearly (can not be scheduled less than 1 calendar year apart) and cover preventive health, immunizations, advance directives and screenings you are entitled to yearly through your medicare benefits. Do not miss out on your entitled benefits, this is when medicare will pay for these benefits to be ordered for you.  These are strongly encouraged by your provider and is the appropriate type of visit to make certain you are up to date with all preventive health benefits. If you have not had your medicare wellness exam in the last 12 months, please make certain to schedule one by calling the office and schedule your medicare wellness with Maudie Mercury as soon as possible.   Yearly physical (well visit):  - Adults are recommended to be seen yearly for physicals. Check with your insurance and date of your last physical, most insurances require one calendar year between physicals. Physicals include all preventive health topics, screenings, medical exam and labs that are appropriate for gender/age and history. You may have fasting labs needed at this visit. This is a well visit (not a sick visit), new problems should not be covered during this visit (see acute visit).  - Pediatric patients are seen more frequently when they are younger. Your provider will advise you on well child visit timing that is appropriate for your their age. - This is not a medicare wellness visit. Medicare wellness exams do not have an exam portion to the visit. Some medicare companies allow for a physical, some do not  allow a yearly physical. If your medicare allows a yearly physical you can schedule the medicare wellness with our nurse Maudie Mercury and have your physical with your provider after, on the same day. Please check with insurance for your full benefits.   Late Policy/No Shows:  - all new patients should arrive 15-30 minutes earlier than appointment to allow [place] time  to  obtain all personal demographics,  insurance information and for you to complete office paperwork. - All established patients should arrive 10-15 minutes earlier than appointment time to update all information and be checked in .  - In our best efforts to run on time, if you are late for your appointment you will be asked to either reschedule or if  able, we will work you back into the schedule. There will be a wait time to work you back in the schedule,  depending on availability.  - If you are unable to make it to your appointment as scheduled, please call 24 hours ahead of time to allow [place] to fill the time slot with someone else who needs to be seen. If you do not cancel your appointment ahead of time, you may be charged a no show fee.

## 2019-10-03 NOTE — Progress Notes (Signed)
Pt called and stated she wanted to use [facility] instead of the [place] location for the Vistaril and she told us wrong at appt. [facility] [place] [facility] was [facility].

## 2019-10-04 ENCOUNTER — Encounter: Payer: Self-pay | Admitting: Family Medicine

## 2019-10-04 DIAGNOSIS — Z789 Other specified health status: Secondary | ICD-10-CM | POA: Insufficient documentation

## 2019-10-17 ENCOUNTER — Telehealth: Payer: Self-pay | Admitting: Family Medicine

## 2019-10-17 ENCOUNTER — Encounter: Payer: Self-pay | Admitting: Family Medicine

## 2019-10-17 ENCOUNTER — Other Ambulatory Visit: Payer: Self-pay

## 2019-10-17 ENCOUNTER — Ambulatory Visit: Payer: Medicare HMO | Admitting: Physician Assistant

## 2019-10-17 ENCOUNTER — Ambulatory Visit (INDEPENDENT_AMBULATORY_CARE_PROVIDER_SITE_OTHER): Payer: Medicare HMO | Admitting: Family Medicine

## 2019-10-17 VITALS — Ht 66.0 in

## 2019-10-17 DIAGNOSIS — R32 Unspecified urinary incontinence: Secondary | ICD-10-CM

## 2019-10-17 DIAGNOSIS — R350 Frequency of micturition: Secondary | ICD-10-CM

## 2019-10-17 LAB — POC URINALSYSI DIPSTICK (AUTOMATED)
Bilirubin, UA: NEGATIVE
Blood, UA: 10
Glucose, UA: NEGATIVE
Ketones, UA: NEGATIVE
Leukocytes, UA: NEGATIVE
Nitrite, UA: NEGATIVE
Protein, UA: NEGATIVE
Spec Grav, UA: 1.025 (ref 1.010–1.025)
Urobilinogen, UA: 0.2 E.U./dL
pH, UA: 5.5 (ref 5.0–8.0)

## 2019-10-17 NOTE — Telephone Encounter (Signed)
Patient is requesting all Rx's to go to [facility].

## 2019-10-17 NOTE — Telephone Encounter (Signed)
Patient reports Dr. Raoul Pitch prescribed something for her at her last visit for "itching". Patient reports meds did not help. She states she is having frequent urination now. She requests another antibiotic. I told her Dr. Raoul Pitch would probably require an office visit. She declined and said she didn't think an OV was needed.  Please call patient at [phone].

## 2019-10-17 NOTE — Telephone Encounter (Signed)
Noted  

## 2019-10-17 NOTE — Progress Notes (Signed)
VIRTUAL VISIT VIA VIDEO  I connected with [name] on 10/17/19 at  4:00 PM EST by a video enabled telemedicine application and verified that I am speaking with the correct person using two identifiers. Location patient: Home Location provider: [facility], Office Persons participating in the virtual visit: Patient, Dr. Raoul Pitch and R.Baker, LPN  I discussed the limitations of evaluation and management by telemedicine and the availability of in person appointments. The patient expressed understanding and agreed to proceed.   SUBJECTIVE Chief Complaint  Patient presents with  . Urinary Frequency    Pt is having urinary frequency since last visit. No fever.     HPI: [name] is a 68 y.o. female present for urinary frequency.  She denies fever, chills, nausea, vomit.  Prior note: She also endorses decreased sleep at night.  She is waking because she has itchiness and she has to get up 2-3 times a night to urinate.  She endorses urinary leakage with urgency and stress incontinence.  She states she does wear pads daily.  She reports she has tried exercising routinely and losing weight to help with her sleep pattern.  She contributed some of her decreased sleep to possible situational stress after losing a few family members.  She states she started Bible study 2 times a week and that has also helped. ROS: See pertinent positives and negatives per HPI.  Patient Active Problem List   Diagnosis Date Noted  . Vegetarian diet 10/04/2019  . Urinary incontinence 10/03/2019  . Pruritus 10/03/2019  . Mixed hyperlipidemia 10/09/2018  . Chronic knee pain 10/03/2018  . Welcome to Medicare preventive visit 10/03/2018  . Paroxysmal atrial fibrillation ([place]) 10/01/2017  . Vitamin D deficiency 03/01/2015  . Obesity (BMI 35.0-39.9 without comorbidity) 03/01/2015    Social History   Tobacco Use  . Smoking status: Former Smoker    Years: 25.00    Types: Cigarettes  .  Smokeless tobacco: Never Used  . Tobacco comment: smoked off and on for about 25 years  Substance Use Topics  . Alcohol use: Yes    Alcohol/week: 0.0 standard drinks    Comment: seldom    Current Outpatient Medications:  .  Ascorbic Acid (VITAMIN C PO), Take 1 tablet by mouth daily., Disp: , Rfl:  .  BIOTIN PO, Take 1 tablet by mouth daily., Disp: , Rfl:  .  Calcium-Magnesium (CAL-MAG PO), Take 1 tablet by mouth daily., Disp: , Rfl:  .  Cholecalciferol (DIALYVITE VITAMIN D 5000 PO), Take 1 tablet by mouth daily., Disp: , Rfl:  .  co-enzyme Q-10 30 MG capsule, Take 30 mg by mouth 3 (three) times daily., Disp: , Rfl:  .  Cyanocobalamin (VITAMIN B 12 PO), Take 1 tablet by mouth daily., Disp: , Rfl:  .  Multiple Vitamin (MULTIVITAMIN) tablet, Take 1 tablet by mouth daily., Disp: , Rfl:  .  Omega 3 1000 MG CAPS, Take 1 capsule by mouth daily. , Disp: , Rfl:  .  hydrOXYzine (VISTARIL) 25 MG capsule, Take 1-2 capsules (25-50 mg total) by mouth at bedtime. (Patient not taking: Reported on 10/17/2019), Disp: 60 capsule, Rfl: 5  No Known Allergies  OBJECTIVE: Ht 5\' 6"  (1.676 m)   LMP 04/28/2012   BMI 31.53 kg/m  Gen: Afebrile. No acute distress.  HENT: AT. [place].  Eyes:Pupils Equal Round Reactive to light, Extraocular movements intact,  Conjunctiva without redness, discharge or icterus..  Neuro:  Alert. Oriented x3  Psych: Normal affect, dress and  demeanor. Normal speech. Normal thought content and judgment.   ASSESSMENT AND PLAN: [name] is a 68 y.o. female present for  Mixed stress and urge urinary incontinence POCT urine today does not appear infectious. Lengthy discussion today surrounding her urinary incontinence and different types of incontinence.  She does seem to have a mixed incontinence of urgency and stress, with urgency being be most bothersome to her. - referral to urology placed.  -Encouraged her to make it routine to use the bathroom every 2-3 hours to empty her  bladder. -Hydration is very important, however she should attempt to avoid excessive fluid consumption after 6 PM.  Ensure to use the bathroom before going to bed. - discussed bladder irritation causes- constipation, carbonated or acidic/spicy foods/fluids.  F/u PRN   Orders Placed This Encounter  Procedures  . POCT Urinalysis Dipstick (Automated)  > 15 minutes spent with patient, > 50% of that time face to face   Howard Pouch, DO 10/17/2019

## 2019-10-17 NOTE — Telephone Encounter (Signed)
Pt was last seen 10/03/2019 for urination and skin issues. Pt is refusing another appt. Please advise.

## 2019-10-24 IMAGING — MG DIGITAL SCREENING BILATERAL MAMMOGRAM WITH TOMO AND CAD
8 series · 8 of 24 positions shown · non-contrast
Comparison: Previous exam(s).

CLINICAL DATA: Screening.

EXAM:
DIGITAL SCREENING BILATERAL MAMMOGRAM WITH TOMO AND CAD

[L CC synth-2D]
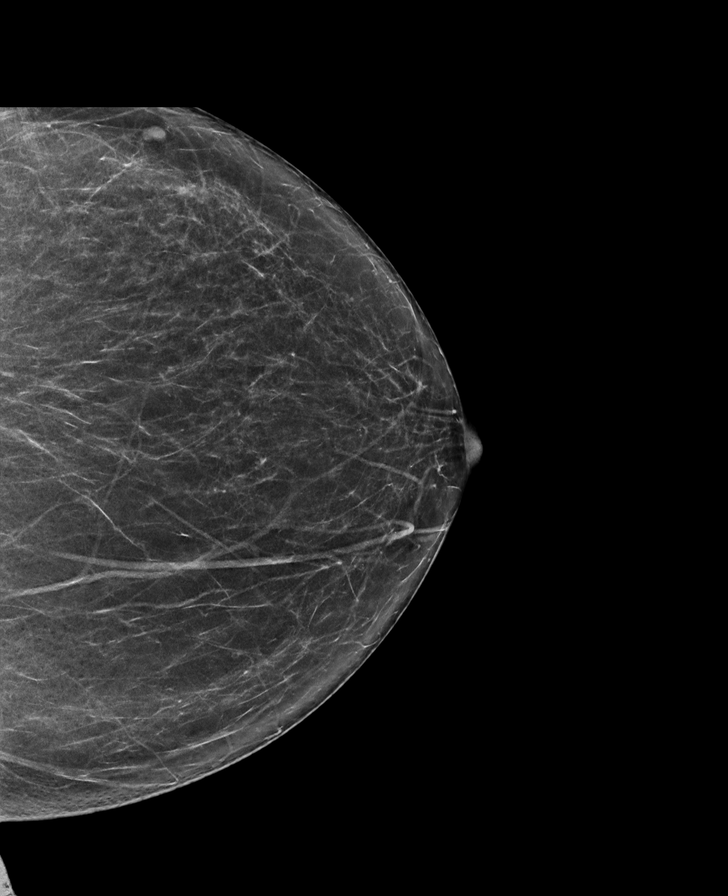

[R CC synth-2D]
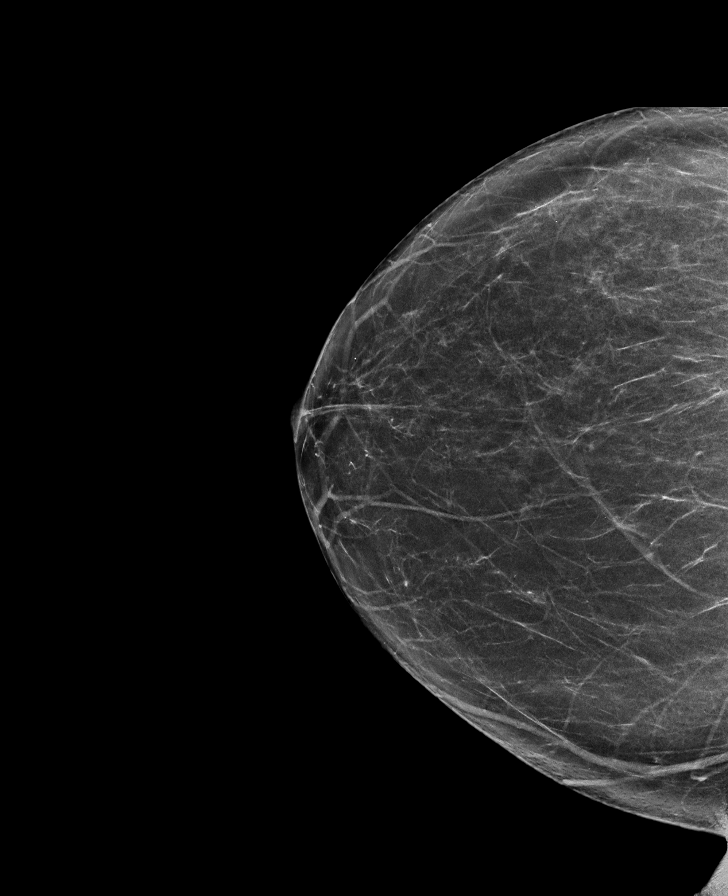

[L MLO synth-2D]
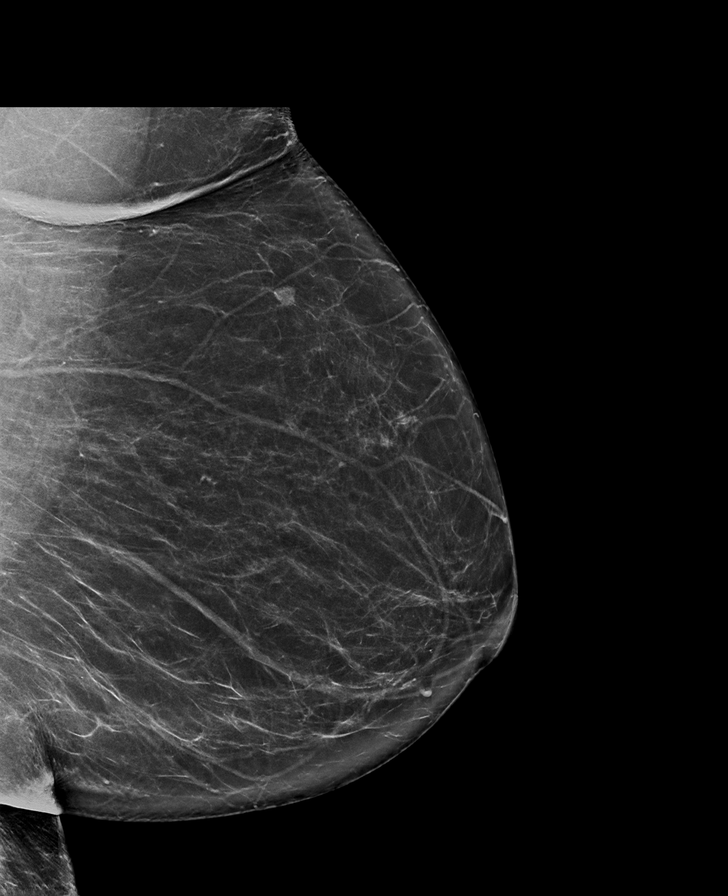

[R MLO synth-2D]
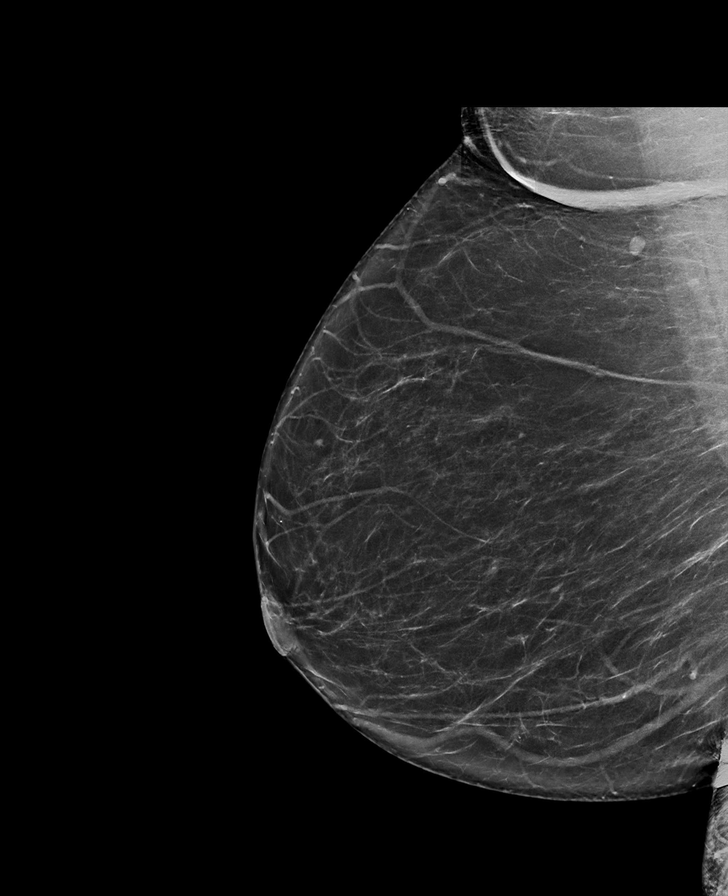

[R MLO tomo · tomo slice 41/81.0]
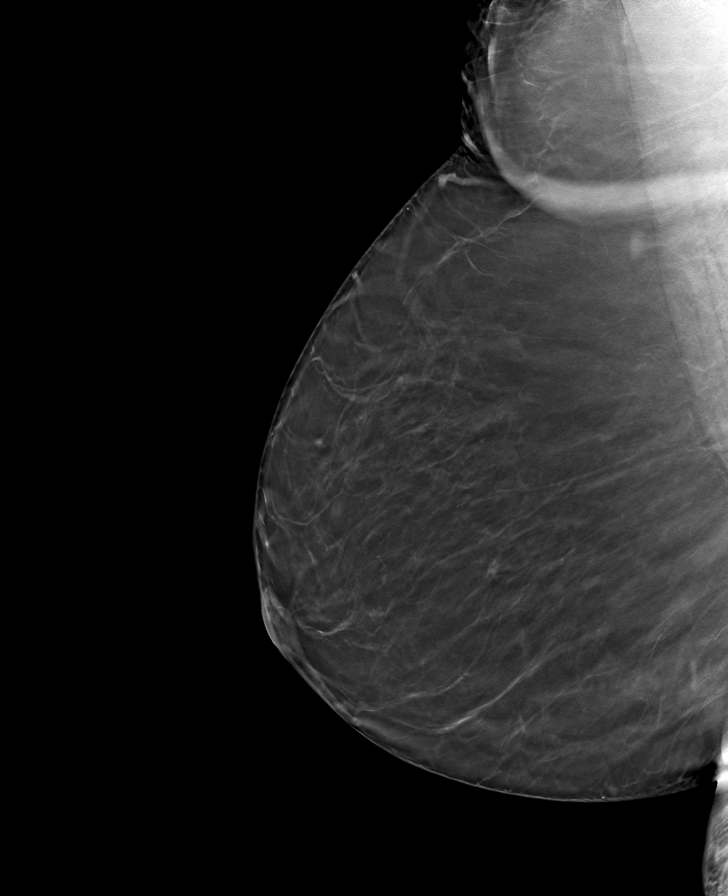

[L MLO tomo · tomo slice 42/83.0]
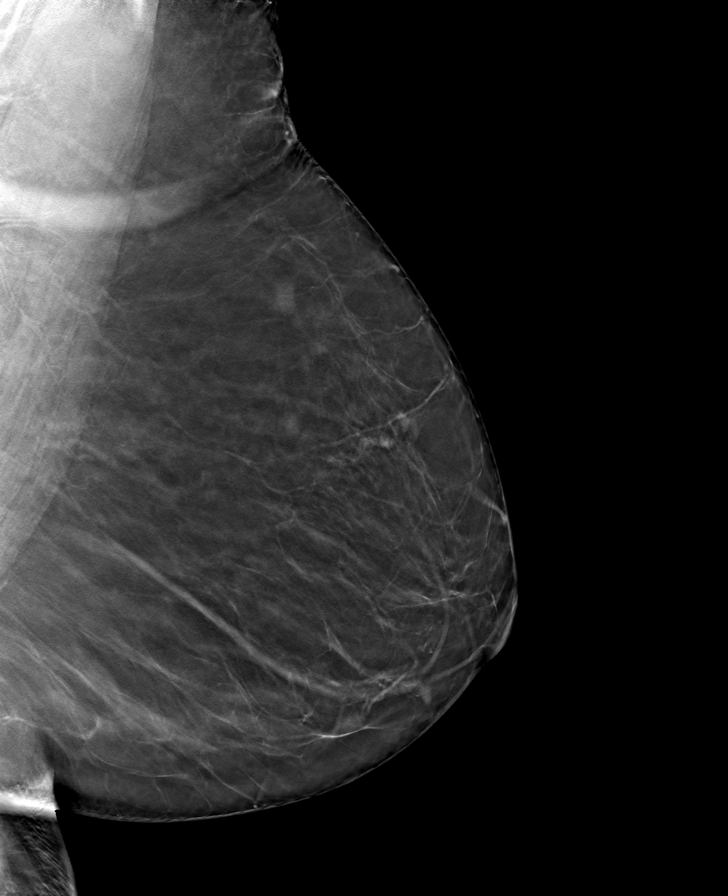

[L CC tomo · tomo slice 37/72.0]
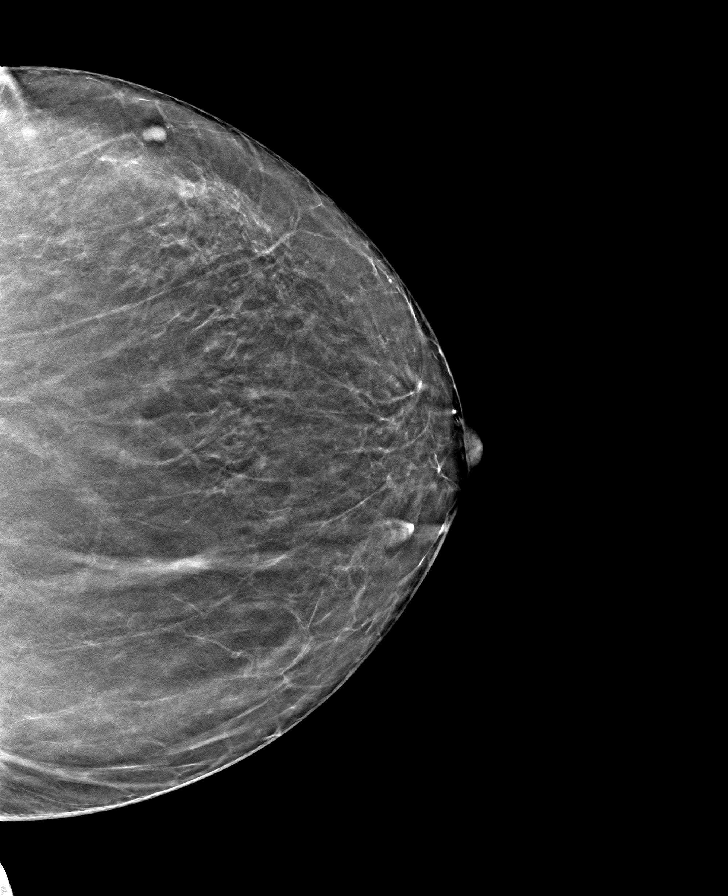

[R CC tomo · tomo slice 37/72.0]
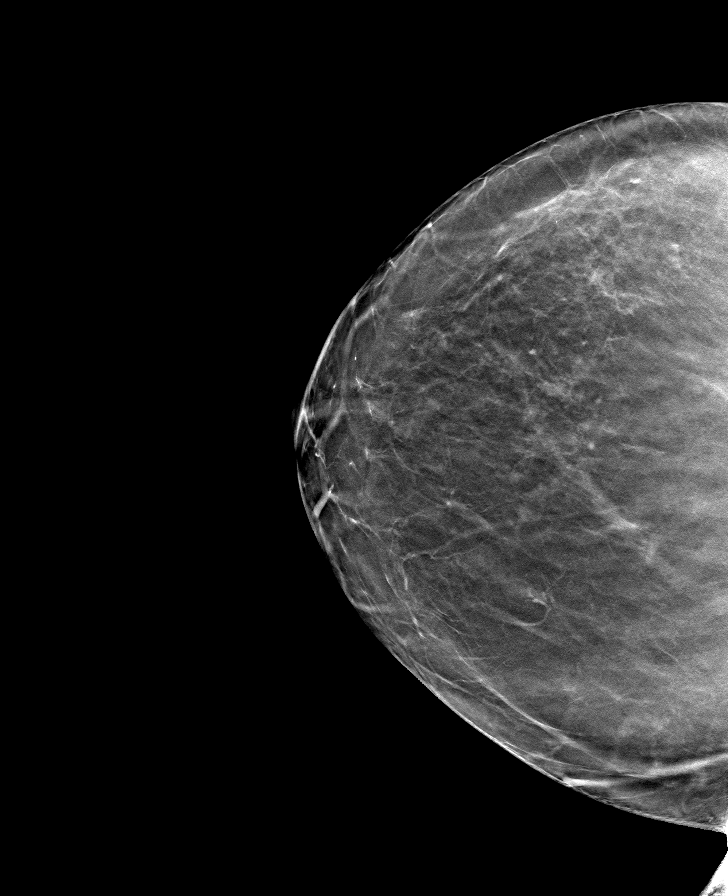

[8 of 24 positions shown; findings below may reference images not displayed]

ACR Breast Density Category b: There are scattered areas of
fibroglandular density.
FINDINGS: There are no findings suspicious for malignancy. Images were
processed with CAD.
IMPRESSION: No mammographic evidence of malignancy. A result letter of this
screening mammogram will be mailed directly to the patient.

RECOMMENDATION:
Screening mammogram in one year. (Code:[id number])

BI-RADS CATEGORY  1: Negative.

## 2019-11-06 ENCOUNTER — Ambulatory Visit: Payer: Medicare HMO | Admitting: Physician Assistant

## 2019-11-06 DIAGNOSIS — L821 Other seborrheic keratosis: Secondary | ICD-10-CM | POA: Diagnosis not present

## 2019-11-06 DIAGNOSIS — L570 Actinic keratosis: Secondary | ICD-10-CM | POA: Diagnosis not present

## 2019-11-09 ENCOUNTER — Ambulatory Visit (INDEPENDENT_AMBULATORY_CARE_PROVIDER_SITE_OTHER): Payer: Medicare HMO | Admitting: Physician Assistant

## 2019-11-09 ENCOUNTER — Other Ambulatory Visit: Payer: Self-pay

## 2019-11-09 ENCOUNTER — Telehealth: Payer: Self-pay | Admitting: Radiology

## 2019-11-09 ENCOUNTER — Encounter: Payer: Self-pay | Admitting: Physician Assistant

## 2019-11-09 DIAGNOSIS — M1711 Unilateral primary osteoarthritis, right knee: Secondary | ICD-10-CM

## 2019-11-09 NOTE — Progress Notes (Signed)
Patient is here for supplemental injection today however her approval is expired.  We will get approval for supplemental injection and then have her return once this is available.  No charge for today's visit.

## 2019-11-09 NOTE — Telephone Encounter (Signed)
Request gel injection right knee.  Patient was approved in 2020 for right knee monovisc. Did not come in for appointment.  Thank you

## 2019-11-13 ENCOUNTER — Ambulatory Visit: Payer: Medicare HMO | Admitting: Internal Medicine

## 2019-11-14 ENCOUNTER — Ambulatory Visit: Payer: Medicare HMO | Admitting: Physician Assistant

## 2019-11-14 DIAGNOSIS — H04123 Dry eye syndrome of bilateral lacrimal glands: Secondary | ICD-10-CM | POA: Diagnosis not present

## 2019-11-14 DIAGNOSIS — Z9889 Other specified postprocedural states: Secondary | ICD-10-CM | POA: Diagnosis not present

## 2019-11-14 DIAGNOSIS — H11823 Conjunctivochalasis, bilateral: Secondary | ICD-10-CM | POA: Diagnosis not present

## 2019-11-14 DIAGNOSIS — H2511 Age-related nuclear cataract, right eye: Secondary | ICD-10-CM | POA: Diagnosis not present

## 2019-11-14 DIAGNOSIS — H25812 Combined forms of age-related cataract, left eye: Secondary | ICD-10-CM | POA: Diagnosis not present

## 2019-11-14 NOTE — Telephone Encounter (Signed)
Noted  

## 2019-11-17 ENCOUNTER — Telehealth: Payer: Self-pay

## 2019-11-17 NOTE — Telephone Encounter (Signed)
Submitted VOB for Monovisc, right knee. 

## 2019-11-21 ENCOUNTER — Telehealth: Payer: Self-pay

## 2019-11-21 NOTE — Telephone Encounter (Signed)
PA required for Monovisc, right knee. PA submitted to [employer] online portal PA Approval# [id number] Valid 11/21/2019 - 05/20/2020

## 2019-11-21 NOTE — Telephone Encounter (Signed)
Patient aware that she is approved for gel injection.  Approved for Monovisc, right knee. [place] Patient will be responsible for 20% OOP. Co-pay of $25.00 PA required  PA Approval#[id number] Valid 11/21/2019- 05/20/2020  Appt. 11/22/2019 with Dr. Ninfa Linden

## 2019-11-22 ENCOUNTER — Other Ambulatory Visit: Payer: Self-pay

## 2019-11-22 ENCOUNTER — Ambulatory Visit: Payer: Medicare HMO | Admitting: Orthopaedic Surgery

## 2019-11-22 ENCOUNTER — Encounter: Payer: Self-pay | Admitting: Orthopaedic Surgery

## 2019-11-22 DIAGNOSIS — M1711 Unilateral primary osteoarthritis, right knee: Secondary | ICD-10-CM | POA: Diagnosis not present

## 2019-11-22 DIAGNOSIS — N3946 Mixed incontinence: Secondary | ICD-10-CM | POA: Diagnosis not present

## 2019-11-22 DIAGNOSIS — N8111 Cystocele, midline: Secondary | ICD-10-CM | POA: Diagnosis not present

## 2019-11-22 MED ORDER — HYALURONAN 88 MG/4ML IX SOSY
88.0000 mg | PREFILLED_SYRINGE | INTRA_ARTICULAR | Status: AC | PRN
  Administered 2019-11-22: 88 mg via INTRA_ARTICULAR

## 2019-11-22 NOTE — Progress Notes (Signed)
   Procedure Note  Patient: [name]             Date of Birth: [birth year]           MRN: [id number]             Visit Date: 11/22/2019  Procedures: Visit Diagnoses:  1. Unilateral primary osteoarthritis, right knee     Large Joint Inj: R knee on 11/22/2019 1:19 PM Indications: diagnostic evaluation and pain Details: 22 G 1.5 in needle, superolateral approach  Arthrogram: No  Medications: 88 mg Hyaluronan 88 MG/4ML Outcome: tolerated well, no immediate complications Procedure, treatment alternatives, risks and benefits explained, specific risks discussed. Consent was given by the patient. Immediately prior to procedure a time out was called to verify the correct patient, procedure, equipment, support staff and site/side marked as required. Patient was prepped and draped in the usual sterile fashion.    The patient is here today for scheduled hyaluronic acid injection with Monovisc in her right knee to treat the pain from osteoarthritis.  She is a very active individual.  She has tried and failed other forms of conservative treatment including steroid injection.  On examination of her right knee today there is no effusion.  She has excellent range of motion of that knee but some global tenderness.  She is already lost 20 pounds and hopes to lose at least 50 pounds.  The knee feels ligamentously stable.  I explained the rationale behind hyaluronic acid injections as well as the risk and benefits.  I placed Monovisc in her right knee without difficulty.  All question concerns were answered and addressed.  Follow-up will be as needed.  She understands that she should wait at least 6 months before having another 1 of these injections but only if she needs it.

## 2019-11-30 ENCOUNTER — Other Ambulatory Visit: Payer: Self-pay

## 2019-11-30 ENCOUNTER — Ambulatory Visit: Payer: Medicare HMO | Admitting: Internal Medicine

## 2019-11-30 ENCOUNTER — Telehealth: Payer: Self-pay

## 2019-11-30 ENCOUNTER — Encounter: Payer: Self-pay | Admitting: Internal Medicine

## 2019-11-30 VITALS — BP 126/72 | HR 72 | Ht 66.0 in

## 2019-11-30 DIAGNOSIS — E663 Overweight: Secondary | ICD-10-CM

## 2019-11-30 DIAGNOSIS — I480 Paroxysmal atrial fibrillation: Secondary | ICD-10-CM

## 2019-11-30 DIAGNOSIS — G4733 Obstructive sleep apnea (adult) (pediatric): Secondary | ICD-10-CM

## 2019-11-30 DIAGNOSIS — R002 Palpitations: Secondary | ICD-10-CM | POA: Diagnosis not present

## 2019-11-30 NOTE — Progress Notes (Signed)
PCP: Ma Hillock, DO   Primary EP: Dr Redge Gainer Martiza Rask is a 69 y.o. female who presents today for routine electrophysiology followup.  Since last being seen in our clinic, the patient reports doing reasonably well. She has occasional palpitations.  She finds these to be very anxiety producing.  She worries about having arrhythmia.  She also worries about having afib.  She has previously worn monitors.  Due to the infrequent nature of her palpitations, her diagnosis is not completely clear. Today, she denies symptoms of chest pain, shortness of breath,  lower extremity edema, dizziness, presyncope, or syncope.  The patient is otherwise without complaint today.   Past Medical History:  Diagnosis Date  . Acute conjunctivitis of both eyes 04/05/2019  . Allergy   . Anxiety   . Arthritis   . Chicken pox   . Colon polyp   . Essential hypertension 10/03/2018  . Fibroids    Uterine  . GERD (gastroesophageal reflux disease)   . Heart murmur   . History of colon polyps   . Hyperlipidemia   . Overweight    Past Surgical History:  Procedure Laterality Date  . NOSE SURGERY    . ROTATOR CUFF REPAIR    . TONSILLECTOMY  1970  . WISDOM TOOTH EXTRACTION      ROS- all systems are reviewed and negatives except as per HPI above  Current Outpatient Medications  Medication Sig Dispense Refill  . Ascorbic Acid (VITAMIN C PO) Take 1 tablet by mouth daily.    [name] BIOTIN PO Take 1 tablet by mouth daily.    . Calcium-Magnesium (CAL-MAG PO) Take 1 tablet by mouth daily.    . Cholecalciferol (DIALYVITE VITAMIN D 5000 PO) Take 1 tablet by mouth daily.    [name] co-enzyme Q-10 30 MG capsule Take 30 mg by mouth 3 (three) times daily.    . Cyanocobalamin (VITAMIN B 12 PO) Take 1 tablet by mouth daily.    . hydrOXYzine (VISTARIL) 25 MG capsule Take 1 capsule by mouth as needed for irritation.    . mirabegron ER (MYRBETRIQ) 25 MG TB24 tablet Take 25 mg by mouth daily.    . Multiple Vitamin  (MULTIVITAMIN) tablet Take 1 tablet by mouth daily.    . Omega 3 1000 MG CAPS Take 1 capsule by mouth daily.      No current facility-administered medications for this visit.    Physical Exam: Vitals:   11/30/19 1143  BP: 126/72  Pulse: 72  SpO2: 99%  Height: 5\' 6"  (1.676 m)    GEN- The patient is well appearing, alert and oriented x 3 today.   Head- normocephalic, atraumatic Eyes-  Sclera clear, conjunctiva pink Ears- hearing intact Oropharynx- clear Lungs- Clear to ausculation bilaterally, normal work of breathing Heart- Regular rate and rhythm, no murmurs, rubs or gallops, PMI not laterally displaced GI- soft, NT, ND, + BS Extremities- no clubbing, cyanosis, or edema  Wt Readings from Last 3 Encounters:  10/03/19 195 lb 6 oz (88.6 kg)  04/05/19 218 lb 8 oz (99.1 kg)  03/07/19 217 lb (98.4 kg)    EKG tracing ordered today is personally reviewed and shows sinus rhythm  Assessment and Plan:  1. Paroxysmal atrial fibrillation/ palpitations Due to infrequent nature, not well characterized.  She is fearful of her palpitations.  She would like to have better characterization of her palpitations and also better determine if she is having afib.  She has previously worn event monitors which  were unrevealing.  Today, we discussed ILR for long term monitoring of afib.  She would like to proceed.  We will schedule ILR implantation at the next available time to further evaluate her palpitations and for better characterization of her afib. Risks and benefits of ILR including bleeding and infection were discussed today.    2. Mild OSA She is due for CPAP titration  3. Overweight Body mass index is 31.53 kg/m. Lifestyle modification is encouraged   Thompson Grayer MD, [facility] 11/30/2019 12:11 PM

## 2019-12-11 ENCOUNTER — Other Ambulatory Visit: Payer: Self-pay

## 2019-12-11 ENCOUNTER — Ambulatory Visit: Payer: Medicare HMO | Admitting: Internal Medicine

## 2019-12-11 ENCOUNTER — Ambulatory Visit (HOSPITAL_COMMUNITY)
Admission: EM | Admit: 2019-12-11 | Discharge: 2019-12-11 | Disposition: A | Discharge status: Home / Self Care | Payer: Medicare HMO | Attending: Family Medicine | Admitting: Family Medicine

## 2019-12-11 ENCOUNTER — Encounter: Payer: Self-pay | Admitting: Internal Medicine

## 2019-12-11 ENCOUNTER — Encounter (HOSPITAL_COMMUNITY): Payer: Self-pay | Admitting: Emergency Medicine

## 2019-12-11 VITALS — BP 142/82 | HR 69 | Ht 66.5 in | Wt 208.2 lb

## 2019-12-11 DIAGNOSIS — R002 Palpitations: Secondary | ICD-10-CM | POA: Diagnosis not present

## 2019-12-11 DIAGNOSIS — Z23 Encounter for immunization: Secondary | ICD-10-CM

## 2019-12-11 DIAGNOSIS — W268XXA Contact with other sharp object(s), not elsewhere classified, initial encounter: Secondary | ICD-10-CM | POA: Diagnosis not present

## 2019-12-11 DIAGNOSIS — S61412A Laceration without foreign body of left hand, initial encounter: Secondary | ICD-10-CM | POA: Diagnosis not present

## 2019-12-11 DIAGNOSIS — I480 Paroxysmal atrial fibrillation: Secondary | ICD-10-CM

## 2019-12-11 HISTORY — PX: OTHER SURGICAL HISTORY: SHX169

## 2019-12-11 MED ORDER — TETANUS-DIPHTH-ACELL PERTUSSIS 5-2.5-18.5 LF-MCG/0.5 IM SUSP
INTRAMUSCULAR | Status: AC
  Filled 2019-12-11: qty 0.5

## 2019-12-11 MED ORDER — TETANUS-DIPHTH-ACELL PERTUSSIS 5-2.5-18.5 LF-MCG/0.5 IM SUSP
0.5000 mL | Freq: Once | INTRAMUSCULAR | Status: AC
  Administered 2019-12-11: 0.5 mL via INTRAMUSCULAR

## 2019-12-11 NOTE — Progress Notes (Signed)
PCP: Ma Hillock, DO   Primary EP: Dr Redge Gainer [name] is a 69 y.o. female who presents today for routine electrophysiology followup.  Since last being seen in our clinic, the patient reports doing very well.  She sliced her L hand opening a can last evening.  She placed a bandage.  I have advised that she should follow-up with urgent care to evaluate this further.  We discussed that she could require a tetanus shot potentially depending on her last booster.  She is anxious.  Has occasional palpitations.  Today, she denies symptoms of chest pain, shortness of breath,  lower extremity edema, dizziness, presyncope, or syncope.  The patient is otherwise without complaint today.   Past Medical History:  Diagnosis Date  . Acute conjunctivitis of both eyes 04/05/2019  . Allergy   . Anxiety   . Arthritis   . Chicken pox   . Colon polyp   . Essential hypertension 10/03/2018  . Fibroids    Uterine  . GERD (gastroesophageal reflux disease)   . Heart murmur   . History of colon polyps   . Hyperlipidemia   . Overweight    Past Surgical History:  Procedure Laterality Date  . NOSE SURGERY    . ROTATOR CUFF REPAIR    . TONSILLECTOMY  1970  . WISDOM TOOTH EXTRACTION      ROS- all systems are reviewed and negatives except as per HPI above  Current Outpatient Medications  Medication Sig Dispense Refill  . Ascorbic Acid (VITAMIN C PO) Take 1 tablet by mouth daily.    Marland Kitchen BIOTIN PO Take 1 tablet by mouth daily.    . Calcium-Magnesium (CAL-MAG PO) Take 1 tablet by mouth daily.    . Cholecalciferol (DIALYVITE VITAMIN D 5000 PO) Take 1 tablet by mouth daily.    Marland Kitchen co-enzyme Q-10 30 MG capsule Take 30 mg by mouth 3 (three) times daily.    . Cyanocobalamin (VITAMIN B 12 PO) Take 1 tablet by mouth daily.    . hydrOXYzine (VISTARIL) 25 MG capsule Take 1 capsule by mouth as needed for irritation.    . mirabegron ER (MYRBETRIQ) 25 MG TB24 tablet Take 25 mg by mouth daily.    . Multiple  Vitamin (MULTIVITAMIN) tablet Take 1 tablet by mouth daily.    . Omega 3 1000 MG CAPS Take 1 capsule by mouth daily.      No current facility-administered medications for this visit.    Physical Exam: Vitals:   12/11/19 1030  BP: (!) 142/82  Pulse: 69  SpO2: 99%  Weight: 208 lb 3.2 oz (94.4 kg)  Height: 5' 6.5" (1.689 m)    GEN- The patient is well appearing, alert and oriented x 3 today.   Head- normocephalic, atraumatic Eyes-  Sclera clear, conjunctiva pink Ears- hearing intact Oropharynx- clear Lungs-  normal work of breathing Heart- Regular rate and rhythm  Extremities- L hand has a small laceration with bandage along the web between her thumb and second finger dorsally.  Not bleeding.  I have advised urgent care visit as above  Wt Readings from Last 3 Encounters:  12/11/19 208 lb 3.2 oz (94.4 kg)  10/03/19 195 lb 6 oz (88.6 kg)  04/05/19 218 lb 8 oz (99.1 kg)   Assessment and Plan:  1. Palpitations  Unclear etiology.  She has a history of afib which has not been well characterized.  She is very worried about her palpitations.  They are infrequent and she  has worn prior monitors which have been unrevealing.  chads2vasc score is at least 3.  She is not on [facility] therapy at this time.  She would prefer to wait until we better document her afib.  I would therefore advise implantation of an implantable loop recorder for long term arrhythmia monitoring.  Risks and benefits to ILR were discussed at length with the patient today, including but not limited to risks of bleeding and infection.  Extensive device education was performed.  Remote monitoring was also discussed at length today.  The patient understands and wishes to proceed.  We will proceed at this time with ILR implantation.  2. Overweight We discussed weight loss today  3. Mild OSA Due for cpap titration  We will follow-up remotely and see in office prn  Thompson Grayer MD, [facility] 12/11/2019 10:47 AM      DESCRIPTION  OF PROCEDURE:  Informed written consent was obtained.  The patient required no sedation for the procedure today.  The patients left chest was prepped and draped. Mapping over the patient's chest was performed to identify the appropriate ILR site.  This area was found to be the mid inframammary/ paraxiphoid region.  The skin overlying this region was infiltrated with lidocaine for local analgesia.  A 0.5-cm incision was made at the implant site.  A subcutaneous ILR pocket was fashioned using a combination of sharp and blunt dissection.  A Medtronic Reveal Linq model [id number] [id number]) implantable loop recorder was then placed into the pocket R waves were very prominent and measured > 0.2 mV. EBL<1 ml.  Steri- Strips and a sterile dressing were then applied.  There were no early apparent complications.     CONCLUSIONS:   1. Successful implantation of a Medtronic Reveal LINQ implantable loop recorder for further evaluation of palpitations and for afib management  2. No early apparent complications.   Thompson Grayer MD, [facility] 12/11/2019 10:47 AM

## 2019-12-11 NOTE — Discharge Instructions (Signed)
We cleaned the wound and placed derma bond Keep dry as possible for the first 24 hours. Information given in packet on how to take care of the wound.  Tetanus updated today.  Watch for signs of infection to include severe redness, pain, swelling or drainage.  Follow up as needed for continued or worsening symptoms

## 2019-12-11 NOTE — ED Triage Notes (Signed)
Pt here for a laceration between her pointer finger and thumb that she got last night opening a can.  The wound is clean, with no approximation at this time.  Pt states she has just a little discomfort with this.  She is not sure when her last tetanus booster was.

## 2019-12-11 NOTE — Patient Instructions (Addendum)
Medication Instructions:  Your physician recommends that you continue on your current medications as directed. Please refer to the Current Medication list given to you today.  Labwork: None ordered.  Testing/Procedures: None ordered.  Follow-Up:  You will have a virtual visit with device clinic for a wound check:  December 21, 2019 at 12:30 pm-this will be a VIRTUAL visit   Your physician wants you to follow-up in:  As needed with Dr. Rayann Heman.  Any Other Special Instructions Will Be Listed Below (If Applicable).  If you need a refill on your cardiac medications before your next appointment, please call your pharmacy.    Implantable Loop Recorder Placement, Care After This sheet gives you information about how to care for yourself after your procedure. Your health care provider may also give you more specific instructions. If you have problems or questions, contact your health care provider. What can I expect after the procedure? After the procedure, it is common to have:  Soreness or discomfort near the incision.  Some swelling or bruising near the incision. Follow these instructions at home: Incision care   Follow instructions from your health care provider about how to take care of your incision. Make sure you: ? Leave your outer dressing on for 24 hours.  After 24 hours you can remove that and shower. ? Leave adhesive strips in place. These skin closures may need to stay in place for 2 weeks or longer. If adhesive strip edges start to loosen and curl up, you may trim the loose edges. Do not remove adhesive strips completely unless your health care provider tells you to do that.  Check your incision area every day for signs of infection. Check for: ? Redness, swelling, or pain. ? Fluid or blood. ? Warmth. ? Pus or a bad smell. Do not take baths, swim, or use a hot tub until your incision is completely healed.  Activity  Return to your normal activities.  General  instructions  Follow instructions from your health care provider about how to manage your implantable loop recorder and transmit the information. Learn how to activate a recording if this is necessary for your type of device.  Do not go through a metal detection gate, and do not let someone hold a metal detector over your chest. Show your ID card.  Do not have an MRI unless you check with your health care provider first.  Take over-the-counter and prescription medicines only as told by your health care provider.  Keep all follow-up visits as told by your health care provider. This is important. Contact a health care provider if:  You have redness, swelling, or pain around your incision.  You have a fever.  You have pain that is not relieved by your pain medicine.  You have triggered your device because of fainting (syncope) or because of a heartbeat that feels like it is racing, slow, fluttering, or skipping (palpitations). Get help right away if you have:  Chest pain.  Difficulty breathing. Summary  After the procedure, it is common to have soreness or discomfort near the incision.  Change your dressing as told by your health care provider.  Follow instructions from your health care provider about how to manage your implantable loop recorder and transmit the information.  Keep all follow-up visits as told by your health care provider. This is important. This information is not intended to replace advice given to you by your health care provider. Make sure you discuss any questions you have with  your health care provider. Document Released: 09/30/2015 Document Revised: 12/04/2017 Document Reviewed: 12/04/2017 Elsevier Patient Education  2020 [employer].

## 2019-12-12 ENCOUNTER — Telehealth: Payer: Self-pay

## 2019-12-12 DIAGNOSIS — S61412A Laceration without foreign body of left hand, initial encounter: Secondary | ICD-10-CM | POA: Diagnosis not present

## 2019-12-12 DIAGNOSIS — W268XXA Contact with other sharp object(s), not elsewhere classified, initial encounter: Secondary | ICD-10-CM | POA: Diagnosis not present

## 2019-12-12 DIAGNOSIS — Z23 Encounter for immunization: Secondary | ICD-10-CM | POA: Diagnosis not present

## 2019-12-12 NOTE — Telephone Encounter (Signed)
Spoke with pt, she removed ILR implant bandage today and noted steri-strip contained a lot of blood.  The bleeding was not wet/ active bleeding.  No s/s of infection, site is not red or swollen, no pulent drainage.  Pt will continue to monitor and if any concerns call office.

## 2019-12-12 NOTE — ED Provider Notes (Signed)
[place]    CSN: [id number] Arrival date & time: 12/11/19  1146      History   Chief Complaint Chief Complaint  Patient presents with  . Laceration    HPI [name] is a 69 y.o. female.   Pt is a 69 year old female that presents with laceration to the left hand in between the index finger and thumb.This occurred last night.  Cut her hand with the metal top of a can. Bleeding controlled. She is not on blood thinners. Unsure of last tetanus. No decreased sensation, numbness, tingling.   ROS per HPI      Past Medical History:  Diagnosis Date  . Acute conjunctivitis of both eyes 04/05/2019  . Allergy   . Anxiety   . Arthritis   . Chicken pox   . Colon polyp   . Essential hypertension 10/03/2018  . Fibroids    Uterine  . GERD (gastroesophageal reflux disease)   . Heart murmur   . History of colon polyps   . Hyperlipidemia   . Overweight     Patient Active Problem List   Diagnosis Date Noted  . Palpitations 12/11/2019  . Vegetarian diet 10/04/2019  . Urinary incontinence 10/03/2019  . Pruritus 10/03/2019  . Mixed hyperlipidemia 10/09/2018  . Chronic knee pain 10/03/2018  . Welcome to Medicare preventive visit 10/03/2018  . Paroxysmal atrial fibrillation ([place]) 10/01/2017  . Vitamin D deficiency 03/01/2015  . Obesity (BMI 35.0-39.9 without comorbidity) 03/01/2015    Past Surgical History:  Procedure Laterality Date  . implantable loop recorder placement  12/11/2019    Medtronic Reveal Linq model LNQ 2 [id number]) implantable loop recorder  . NOSE SURGERY    . ROTATOR CUFF REPAIR    . TONSILLECTOMY  1970  . WISDOM TOOTH EXTRACTION      OB History   No obstetric history on file.      Home Medications    Prior to Admission medications   Medication Sig Start Date End Date Taking? Authorizing Provider  Ascorbic Acid (VITAMIN C PO) Take 1 tablet by mouth daily.   Yes [provider]  BIOTIN PO Take 1 tablet by mouth daily.    Yes [provider]  Calcium-Magnesium (CAL-MAG PO) Take 1 tablet by mouth daily.   Yes [provider]  Cholecalciferol (DIALYVITE VITAMIN D 5000 PO) Take 1 tablet by mouth daily.   Yes [provider]  co-enzyme Q-10 30 MG capsule Take 30 mg by mouth 3 (three) times daily.   Yes [provider]  Cyanocobalamin (VITAMIN B 12 PO) Take 1 tablet by mouth daily.   Yes [provider]  hydrOXYzine (VISTARIL) 25 MG capsule Take 1 capsule by mouth as needed for irritation. 10/30/19  Yes [provider]  Multiple Vitamin (MULTIVITAMIN) tablet Take 1 tablet by mouth daily.   Yes [provider]  Omega 3 1000 MG CAPS Take 1 capsule by mouth daily.    Yes [provider]  mirabegron ER (MYRBETRIQ) 25 MG TB24 tablet Take 25 mg by mouth daily.    [provider]    Family History Family History  Problem Relation Age of Onset  . Hearing loss Mother   . Macular degeneration Mother   . Alzheimer's disease Father   . Hypertension Father   . Heart disease Father   . Stomach cancer Maternal Grandmother   . Ovarian cancer Maternal Grandmother   . Emphysema Maternal Grandfather   . Colon cancer  Maternal Uncle   . Lung cancer Sister   . Heart disease Brother   . Esophageal cancer Neg Hx   . Rectal cancer Neg Hx   . Breast cancer Neg Hx     Social History Social History   Tobacco Use  . Smoking status: Former Smoker    Years: 25.00    Types: Cigarettes  . Smokeless tobacco: Never Used  . Tobacco comment: smoked off and on for about 25 years  Substance Use Topics  . Alcohol use: Yes    Alcohol/week: 0.0 standard drinks    Comment: seldom  . Drug use: No     Allergies   Patient has no known allergies.   Review of Systems Review of Systems   Physical Exam Triage Vital Signs ED Triage Vitals [12/11/19 1204]  Enc Vitals Group     BP 131/60     Pulse Rate 65     Resp 12     Temp 98 F (36.7 C)      Temp Source Oral     SpO2 100 %     Weight      Height      Head Circumference      Peak Flow      Pain Score 1     Pain Loc      Pain Edu?      Excl. in [place]?    No data found.  Updated Vital Signs BP 131/60 (BP Location: Right Arm)   Pulse 65   Temp 98 F (36.7 C) (Oral)   Resp 12   LMP 04/28/2012   SpO2 100%   Visual Acuity Right Eye Distance:   Left Eye Distance:   Bilateral Distance:    Right Eye Near:   Left Eye Near:    Bilateral Near:     Physical Exam Vitals and nursing note reviewed.  Constitutional:      General: She is not in acute distress.    Appearance: Normal appearance. She is not ill-appearing, toxic-appearing or diaphoretic.  HENT:     Head: Normocephalic.     Nose: Nose normal.  Eyes:     Conjunctiva/sclera: Conjunctivae normal.  Pulmonary:     Effort: Pulmonary effort is normal.  Musculoskeletal:        General: Normal range of motion.     Cervical back: Normal range of motion.  Skin:    General: Skin is warm and dry.     Findings: No rash.     Comments: Approximated 1 inch superficial laceration to the dorsum of  left hand.  Bleeding controlled.   Neurological:     Mental Status: She is alert.  Psychiatric:        Mood and Affect: Mood normal.      UC Treatments / Results  Labs (all labs ordered are listed, but only abnormal results are displayed) Labs Reviewed - No data to display  EKG   Radiology No results found.  Procedures Laceration Repair  Date/Time: 12/12/2019 8:12 AM Performed by: Orvan July, NP Authorized by: Orvan July, NP   Consent:    Consent obtained:  Verbal   Consent given by:  Patient   Risks discussed:  Infection, pain and need for additional repair Anesthesia (see MAR for exact dosages):    Anesthesia method:  None Laceration details:    Location:  Hand   Hand location:  L hand, dorsum   Length (cm):  1 Repair type:  Repair type:  Simple Exploration:    Hemostasis achieved with:   Direct pressure   Contaminated: no   Treatment:    Area cleansed with:  Saline Skin repair:    Repair method:  Tissue adhesive Approximation:    Approximation:  Close Post-procedure details:    Dressing:  Open (no dressing)   Patient tolerance of procedure:  Tolerated well, no immediate complications   (including critical care time)  Medications Ordered in UC Medications  Tdap (BOOSTRIX) injection 0.5 mL (0.5 mLs Intramuscular Given 12/11/19 1305)    Initial Impression / Assessment and Plan / UC Course  I have reviewed the triage vital signs and the nursing notes.  Pertinent labs & imaging results that were available during my care of the patient were reviewed by me and considered in my medical decision making (see chart for details).     Laceration- cleaned well and closed with derma bond.  Tetanus updated.  Final Clinical Impressions(s) / UC Diagnoses   Final diagnoses:  Laceration of left hand without foreign body, initial encounter     Discharge Instructions     We cleaned the wound and placed derma bond Keep dry as possible for the first 24 hours. Information given in packet on how to take care of the wound.  Tetanus updated today.  Watch for signs of infection to include severe redness, pain, swelling or drainage.  Follow up as needed for continued or worsening symptoms     ED Prescriptions    None     PDMP not reviewed this encounter.   Loura Halt A, NP 12/12/19 [phone]

## 2019-12-12 NOTE — Telephone Encounter (Signed)
The pt states under her steri strips it is very bloody and it is not actively bleeding. She wants to know if it going to get infected. I let her talk with the device nurse Amy, Rn.

## 2019-12-14 ENCOUNTER — Telehealth: Payer: Self-pay | Admitting: *Deleted

## 2019-12-14 NOTE — Telephone Encounter (Signed)
LINQ alert received for AF and symptom episodes. 2 symptom ECGs reviewed, episode from 2/9 shows short run of AT, episode from 2/10 at 23:59 shows ~11min PAF (device detection programmed for episodes >57min duration). 1 AF episode on 2/10 at 15:09, 5.8% burden since implant on 12/11/19, duration 3hr 53min. Not currently on [place].  Spoke with patient. She reports that she felt like she has more awareness of her heartbeat during symptom episodes, possibly some SOB. Not clearly symptomatic with device-detected AF episode on 2/10. Pt reports that the ILR was causing her a lot of stress yesterday, anxious about having a battery implanted in her body. She reports that today she is feeling much better about her situation and reports she realizes that the ILR will be helpful long-term. Pt reports that she is reluctant to take any additional medications and would prefer to "fix" the issue if possible. Advised I will forward message to Dr. Rayann Heman for recommendations. Pt in agreement with plan, denies additional questions or concerns at this time.

## 2019-12-18 NOTE — Telephone Encounter (Signed)
Another alert received for a new episode.  Pt  has not been contacted about this most recent episode.

## 2019-12-19 DIAGNOSIS — R351 Nocturia: Secondary | ICD-10-CM | POA: Diagnosis not present

## 2019-12-19 DIAGNOSIS — N3946 Mixed incontinence: Secondary | ICD-10-CM | POA: Diagnosis not present

## 2019-12-19 DIAGNOSIS — R350 Frequency of micturition: Secondary | ICD-10-CM | POA: Diagnosis not present

## 2019-12-19 NOTE — Telephone Encounter (Signed)
Discussed with Dr. Rayann Heman. Plan to adjust Carelink AF alerts for duration >23hrs, V rates >120bpm for 6hrs. Continue to monitor for burden/duration via monthly summary reports. No medication changes at this time.  Will advise patient at 12/21/19 wound check.

## 2019-12-20 ENCOUNTER — Other Ambulatory Visit: Payer: Self-pay

## 2019-12-20 ENCOUNTER — Telehealth: Payer: Self-pay

## 2019-12-20 ENCOUNTER — Encounter: Payer: Self-pay | Admitting: *Deleted

## 2019-12-20 ENCOUNTER — Ambulatory Visit (INDEPENDENT_AMBULATORY_CARE_PROVIDER_SITE_OTHER): Payer: Medicare HMO | Admitting: *Deleted

## 2019-12-20 DIAGNOSIS — I480 Paroxysmal atrial fibrillation: Secondary | ICD-10-CM

## 2019-12-20 DIAGNOSIS — Z95818 Presence of other cardiac implants and grafts: Secondary | ICD-10-CM

## 2019-12-20 LAB — CUP PACEART INCLINIC DEVICE CHECK
Date Time Interrogation Session: 20210217181059
Implantable Pulse Generator Implant Date: 20210208

## 2019-12-20 NOTE — Progress Notes (Signed)
ILR wound check in clinic. Steri strips removed. Small scab present at incision. No drainage, redness, swelling, fever/chills noted. Patient educated about wound care, encouraged to leave incision OTA, wash site daily with soap and water. Carelink app transmitting nightly.  ILR interrogated. R-waves 0.32mV. 7 symptom episodes, 2 available ECGs correlate with detected AF episodes, 1 ECG shows SR. 11 AF episodes (8% burden), longest 6.5hrs, not currently on [place], V rates elevated at times. No tachy episodes.   Patient reported during ILR interrogation that she was feeling chest pressure due to anxiety, which dissipated after interrogation completed. Patient denies any associated symptoms, including palpitations, chest pain, shortness of breath, or dizziness. Reports these episodes have increased in frequency since ILR was implanted. She has done research online and has concerns about long-term effects of having an ILR, specifically about the risks of having a battery implanted in her body. Reassured patient that long-term monitoring is safe. ED precautions reviewed for worsening cardiac symptoms. Patient is planning to f/u with PCP to discuss obtaining prescription for PRN medication for anxiety. Patient stated after extensive discussion, "I feel better now that I know more." Offered to schedule patient for virtual visit with Dr. Rayann Heman to discuss concerns about ILR and AF episodes, which patient is agreeable to. Appointment scheduled for 12/21/19 at 10:00am. She denied any additional concerns prior to completion of appointment and left office in NAD.

## 2019-12-20 NOTE — Telephone Encounter (Signed)
Pt called to reschedule her wound check appointment for tomorrow. I changed her appointment for today at 3:30 pm. The pt agreed to come to the appointment.

## 2019-12-21 ENCOUNTER — Telehealth: Payer: Medicare HMO

## 2019-12-21 ENCOUNTER — Ambulatory Visit: Payer: Medicare HMO

## 2019-12-21 ENCOUNTER — Telehealth (INDEPENDENT_AMBULATORY_CARE_PROVIDER_SITE_OTHER): Payer: Medicare HMO | Admitting: Internal Medicine

## 2019-12-21 ENCOUNTER — Encounter: Payer: Self-pay | Admitting: Internal Medicine

## 2019-12-21 VITALS — BP 115/77 | HR 74 | Ht 66.5 in | Wt 203.0 lb

## 2019-12-21 DIAGNOSIS — G4733 Obstructive sleep apnea (adult) (pediatric): Secondary | ICD-10-CM

## 2019-12-21 DIAGNOSIS — E663 Overweight: Secondary | ICD-10-CM | POA: Diagnosis not present

## 2019-12-21 DIAGNOSIS — F419 Anxiety disorder, unspecified: Secondary | ICD-10-CM

## 2019-12-21 DIAGNOSIS — R002 Palpitations: Secondary | ICD-10-CM | POA: Diagnosis not present

## 2019-12-21 DIAGNOSIS — I480 Paroxysmal atrial fibrillation: Secondary | ICD-10-CM | POA: Diagnosis not present

## 2019-12-21 NOTE — Progress Notes (Signed)
Electrophysiology TeleHealth Note  Due to national recommendations of social distancing due to [place] 19, an audio telehealth visit is felt to be most appropriate for this patient at this time.  Verbal consent was obtained by me for the telehealth visit today.  The patient does not have capability for a virtual visit.  A phone visit is therefore required today.   Date:  12/21/2019   ID:  [name], DOB 1951-08-16, MRN [id number]  Location: patient's home  Provider location:  [facility]  Evaluation Performed: Follow-up visit  PCP:  Ma Hillock, DO   Electrophysiologist:  Dr Rayann Heman  Chief Complaint:  palpitations  History of Present Illness:    [name] is a 69 y.o. female who presents via telehealth conferencing today.  Since last being seen in our clinic, the patient reports doing reasonably well.  She is very anxious today.  She has numerous questions about afib detected on her ILR.  She has minor palpitations during afib but really does not feel bad.  Today, she denies symptoms of palpitations, chest pain, shortness of breath,  lower extremity edema, dizziness, presyncope, or syncope.  The patient is otherwise without complaint today.  The patient denies symptoms of fevers, chills, cough, or new SOB worrisome for COVID 19.  Past Medical History:  Diagnosis Date  . Acute conjunctivitis of both eyes 04/05/2019  . Allergy   . Anxiety   . Arthritis   . Chicken pox   . Colon polyp   . Essential hypertension 10/03/2018   patient denies diagnosis of afib  . Fibroids    Uterine  . GERD (gastroesophageal reflux disease)   . Heart murmur   . History of colon polyps   . Hyperlipidemia   . Overweight     Past Surgical History:  Procedure Laterality Date  . implantable loop recorder placement  12/11/2019    Medtronic Reveal Linq model LNQ 2 [id number]) implantable loop recorder  . NOSE SURGERY    . ROTATOR CUFF REPAIR    . TONSILLECTOMY  1970  . WISDOM  TOOTH EXTRACTION      Current Outpatient Medications  Medication Sig Dispense Refill  . Ascorbic Acid (VITAMIN C PO) Take 1 tablet by mouth daily.    Marland Kitchen BIOTIN PO Take 1 tablet by mouth daily.    . Calcium-Magnesium (CAL-MAG PO) Take 1 tablet by mouth daily.    . Cholecalciferol (DIALYVITE VITAMIN D 5000 PO) Take 1 tablet by mouth daily.    Marland Kitchen co-enzyme Q-10 30 MG capsule Take 30 mg by mouth 3 (three) times daily.    . Cyanocobalamin (VITAMIN B 12 PO) Take 1 tablet by mouth daily.    . hydrOXYzine (VISTARIL) 25 MG capsule Take 1 capsule by mouth as needed for irritation.    . mirabegron ER (MYRBETRIQ) 25 MG TB24 tablet Take 25 mg by mouth daily.    . Multiple Vitamin (MULTIVITAMIN) tablet Take 1 tablet by mouth daily.    . Omega 3 1000 MG CAPS Take 1 capsule by mouth daily.      No current facility-administered medications for this visit.    Allergies:   Patient has no known allergies.   Social History:  The patient  reports that she has quit smoking. Her smoking use included cigarettes. She quit after 25.00 years of use. She has never used smokeless tobacco. She reports current alcohol use. She reports that she does not use drugs.   Family History:  The  patient's family history includes Alzheimer's disease in her father; Colon cancer in her maternal uncle; Emphysema in her maternal grandfather; Hearing loss in her mother; Heart disease in her brother and father; Hypertension in her father; Lung cancer in her sister; Macular degeneration in her mother; Ovarian cancer in her maternal grandmother; Stomach cancer in her maternal grandmother.   ROS:  Please see the history of present illness.   All other systems are personally reviewed and negative.    Exam:    Vital Signs:  BP 115/77   Pulse 74   Ht 5' 6.5" (1.689 m)   Wt 203 lb (92.1 kg)   LMP 04/28/2012   BMI 32.27 kg/m   Well sounding and appearing, alert and conversant, regular work of breathing,  good skin color Eyes-  anicteric, neuro- grossly intact, skin- no apparent rash or lesions or cyanosis, mouth- oral mucosa is pink  Labs/Other Tests and Data Reviewed:    Recent Labs: No results found for requested labs within last 8760 hours.   Wt Readings from Last 3 Encounters:  12/21/19 203 lb (92.1 kg)  12/11/19 208 lb 3.2 oz (94.4 kg)  10/03/19 195 lb 6 oz (88.6 kg)     Last device remote is reviewed from [facility] PDF which reveals normal device function, afib noted   ASSESSMENT & PLAN:    1.  Paroxysmal atrial fibrillation She has had afib detected by her ILR chads2vasc score is 2.  This patients CHA2DS2-VASc Score and unadjusted Ischemic Stroke Rate (% per year) is equal to 2.2 % stroke rate/year from a score of 2  Above score calculated as 1 point each if present [CHF, HTN, DM, Vascular=MI/PAD/Aortic Plaque, Age if 65-74, or Female] Above score calculated as 2 points each if present [Age > 75, or Stroke/TIA/TE]  Today, I discussed Coumadin as well as novel anticoagulants including Pradaxa, Xarelto, Savaysa, and Eliquis today as indicated for risk reduction in stroke and systemic emboli with nonvalvular atrial fibrillation.  Risks, benefits, and alternatives to each of these drugs were discussed at length today.  At this time, she would like to avoid [facility] therapy.  Given update to guideline for females, guidelines support her decision to avoid [facility] therapy given chads2vasc score is 2.  We could consider addition of diltiazem and possibly low dose flecainide.  She is very worried about trying new medicines.  We will therefore make no changes today.  2. Overweight  Lifestyle modification is advise  3. Mild OSA Due for CPAP titration  4. Anxiety Very anxious today We discussed the importance of relaxation and not worrying at length.  She may benefit from follow-up with primary care for treatment.   Follow-up:  AF clinic in 4 weeks  Patient Risk:  after full review of this patients clinical  status, I feel that they are at moderate risk at this time.  Today, I have spent 15 minutes with the patient with telehealth technology discussing arrhythmia management .    Army Fossa, MD  12/21/2019 10:41 AM     [facility] [address] [place] [address] [phone] (office) [phone] (fax)

## 2019-12-22 ENCOUNTER — Telehealth (HOSPITAL_COMMUNITY): Payer: Self-pay | Admitting: Nurse Practitioner

## 2019-12-22 NOTE — Telephone Encounter (Signed)
Patient returned my call and is aware of f/u appt 01/18/20 with Butch Penny.

## 2019-12-22 NOTE — Telephone Encounter (Signed)
Left message for patient to call back to schedule 4 wk f/u with Roderic Palau, NP per Dr. Rayann Heman.

## 2019-12-26 ENCOUNTER — Telehealth: Payer: Self-pay

## 2019-12-26 NOTE — Telephone Encounter (Signed)
Pt returned call and said she has had anxiety time to time over the years. Recently she has had more stress. Cardiology is investigating why she is having palpitations but she thinks it is anxiety. Pt advised she would have to make appt for this. She stated she could just call her prior PCP and get something called in without being seen. Pt was advised if she wanted to go back to her PCP she could but we would need to remove Dr Raoul Pitch as her Primary and she could not have two PCP. Pt scheduled appt with Dr Raoul Pitch tomorrow morning.

## 2019-12-26 NOTE — Telephone Encounter (Signed)
Patient requesting something for anxiety.  Patient would like in called today please. Please call patient at [phone]

## 2019-12-27 ENCOUNTER — Other Ambulatory Visit: Payer: Self-pay

## 2019-12-27 ENCOUNTER — Encounter: Payer: Self-pay | Admitting: Family Medicine

## 2019-12-27 ENCOUNTER — Ambulatory Visit (INDEPENDENT_AMBULATORY_CARE_PROVIDER_SITE_OTHER): Payer: Medicare HMO | Admitting: Family Medicine

## 2019-12-27 VITALS — Ht 66.0 in | Wt 199.0 lb

## 2019-12-27 DIAGNOSIS — F419 Anxiety disorder, unspecified: Secondary | ICD-10-CM | POA: Diagnosis not present

## 2019-12-27 MED ORDER — LORAZEPAM 0.5 MG PO TABS: 0.2500 mg | ORAL_TABLET | Freq: Every day | ORAL | 5 refills | Status: DC | PRN

## 2019-12-27 NOTE — Patient Instructions (Addendum)
COVID-19 Vaccine Information can be found at: [email or web address] For questions related to vaccine distribution or appointments, please email [email or web address] or call [phone].  Covid Vaccine appointment go to [email or web address].   Managing Anxiety, Adult After being diagnosed with an anxiety disorder, you may be relieved to know why you have felt or behaved a certain way. You may also feel overwhelmed about the treatment ahead and what it will mean for your life. With care and support, you can manage this condition and recover from it. How to manage lifestyle changes Managing stress and anxiety  Stress is your body's reaction to life changes and events, both good and bad. Most stress will last just a few hours, but stress can be ongoing and can lead to more than just stress. Although stress can play a major role in anxiety, it is not the same as anxiety. Stress is usually caused by something external, such as a deadline, test, or competition. Stress normally passes after the triggering event has ended.  Anxiety is caused by something internal, such as imagining a terrible outcome or worrying that something will go wrong that will devastate you. Anxiety often does not go away even after the triggering event is over, and it can become long-term (chronic) worry. It is important to understand the differences between stress and anxiety and to manage your stress effectively so that it does not lead to an anxious response. Talk with your health care provider or a counselor to learn more about reducing anxiety and stress. He or she may suggest tension reduction techniques, such as:  Music therapy. This can include creating or listening to music that you enjoy and that inspires you.  Mindfulness-based meditation. This involves being aware of your normal breaths while not trying to control your breathing. It can be done while sitting  or walking.  Centering prayer. This involves focusing on a word, phrase, or sacred image that means something to you and brings you peace.  Deep breathing. To do this, expand your stomach and inhale slowly through your nose. Hold your breath for 3-5 seconds. Then exhale slowly, letting your stomach muscles relax.  Self-talk. This involves identifying thought patterns that lead to anxiety reactions and changing those patterns.  Muscle relaxation. This involves tensing muscles and then relaxing them. Choose a tension reduction technique that suits your lifestyle and personality. These techniques take time and practice. Set aside 5-15 minutes a day to do them. Therapists can offer counseling and training in these techniques. The training to help with anxiety may be covered by some insurance plans. Other things you can do to manage stress and anxiety include:  Keeping a stress/anxiety diary. This can help you learn what triggers your reaction and then learn ways to manage your response.  Thinking about how you react to certain situations. You may not be able to control everything, but you can control your response.  Making time for activities that help you relax and not feeling guilty about spending your time in this way.  Visual imagery and yoga can help you stay calm and relax.  Medicines Medicines can help ease symptoms. Medicines for anxiety include:  Anti-anxiety drugs.  Antidepressants. Medicines are often used as a primary treatment for anxiety disorder. Medicines will be prescribed by a health care provider. When used together, medicines, psychotherapy, and tension reduction techniques may be the most effective treatment. Relationships Relationships can play a big part in helping you recover. Try to spend more time connecting with trusted friends and family  members. Consider going to couples counseling, taking family education classes, or going to family therapy. Therapy can help you  and others better understand your condition. How to recognize changes in your anxiety Everyone responds differently to treatment for anxiety. Recovery from anxiety happens when symptoms decrease and stop interfering with your daily activities at home or work. This may mean that you will start to:  Have better concentration and focus. Worry will interfere less in your daily thinking.  Sleep better.  Be less irritable.  Have more energy.  Have improved memory. It is important to recognize when your condition is getting worse. Contact your health care provider if your symptoms interfere with home or work and you feel like your condition is not improving. Follow these instructions at home: Activity  Exercise. Most adults should do the following: ? Exercise for at least 150 minutes each week. The exercise should increase your heart rate and make you sweat (moderate-intensity exercise). ? Strengthening exercises at least twice a week.  Get the right amount and quality of sleep. Most adults need 7-9 hours of sleep each night. Lifestyle   Eat a healthy diet that includes plenty of vegetables, fruits, whole grains, low-fat dairy products, and lean protein. Do not eat a lot of foods that are high in solid fats, added sugars, or salt.  Make choices that simplify your life.  Do not use any products that contain nicotine or tobacco, such as cigarettes, e-cigarettes, and chewing tobacco. If you need help quitting, ask your health care provider.  Avoid caffeine, alcohol, and certain over-the-counter cold medicines. These may make you feel worse. Ask your pharmacist which medicines to avoid. General instructions  Take over-the-counter and prescription medicines only as told by your health care provider.  Keep all follow-up visits as told by your health care provider. This is important. Where to find support You can get help and support from these sources:  Self-help groups.  Online and  [employer].  A trusted spiritual leader.  Couples counseling.  Family education classes.  Family therapy. Where to find more information You may find that joining a support group helps you deal with your anxiety. The following sources can help you locate counselors or support groups near you:  [place]: www.mentalhealthamerica.net  Anxiety and Depression Association of [place] (ADAA): https://www.clark.net/  National Alliance on Mental Illness (NAMI): www.nami.org Contact a health care provider if you:  Have a hard time staying focused or finishing daily tasks.  Spend many hours a day feeling worried about everyday life.  Become exhausted by worry.  Start to have headaches, feel tense, or have nausea.  Urinate more than normal.  Have diarrhea. Get help right away if you have:  A racing heart and shortness of breath.  Thoughts of hurting yourself or others. If you ever feel like you may hurt yourself or others, or have thoughts about taking your own life, get help right away. You can go to your nearest emergency department or call:  Your local emergency services (911 in the [place]  A suicide crisis helpline, such as the [place] at 705-166-2113. This is open 24 hours a day. Summary  Taking steps to learn and use tension reduction techniques can help calm you and help prevent triggering an anxiety reaction.  When used together, medicines, psychotherapy, and tension reduction techniques may be the most effective treatment.  Family, friends, and partners can play a big part in helping you recover from an anxiety disorder.  This information is not intended to replace advice given to you by your health care provider. Make sure you discuss any questions you have with your health care provider. Document Revised: 03/21/2019 Document Reviewed: 03/21/2019 Elsevier Patient Education  [place].

## 2019-12-27 NOTE — Progress Notes (Signed)
VIRTUAL VISIT VIA VIDEO  I connected with [name] on 12/27/19 at  9:00 AM EST by elemedicine application and verified that I am speaking with the correct person using two identifiers. Location patient: Home Location provider: [facility] Office Persons participating in the virtual visit: Patient, Dr. Raoul Pitch and R.Baker, LPN  I discussed the limitations of evaluation and management by telemedicine and the availability of in person appointments. The patient expressed understanding and agreed to proceed.   SUBJECTIVE Chief Complaint  Patient presents with  . Anxiety    HPI: [name] is a 69 y.o. female present to discuss her anxiety.  Patient reports she has had increased anxiety over the last few months.  She recently has been diagnosed with A. fib and is needing to wear a loop recorder for monitoring.  She feels this has contributed to her increased stress.  She does not like the idea of have to wear a loop recorder and having her daily life monitored.  She also reports she has some family dynamics contributing to her increased stress.  She states she feels like she is a positive person and tries to stay positive and feels that is important.  However she does admit that she also feels she is not addressing her anxiety and stress properly because she feels bad about not being is positive when she does so.  She reports she "wants to be in control of her life "and feels she is not as in control of her life with her new diagnosis of A. fib and her weight gain.  She reports she has a history of panic attacks that started when her significant other had a seizure in the middle the night a few years ago.  She exercises 3 times a week to help with her weight and her anxiety.  She attends Bible studies 2 times a week, which she states helps a great deal. Prior meds: Vistaril, Xanax, Prozac 20 have been tried.  Patient states she does not want to take medication every day.  She  reports a lot of time she feels better just knowing she has something to take if needed.  She does not want any medication that is "unhealthy."    ROS: See pertinent positives and negatives per HPI. Depression screen [facility] 2/9 12/27/2019 10/03/2019 10/03/2018  Decreased Interest 0 0 0  Down, Depressed, Hopeless 0 0 0  PHQ - 2 Score 0 0 0  Altered sleeping 1 - -  Tired, decreased energy 0 - -  Change in appetite 1 - -  Feeling bad or failure about yourself  1 - -  Trouble concentrating 0 - -  Moving slowly or fidgety/restless 0 - -  Suicidal thoughts 0 - -  PHQ-9 Score 3 - -   GAD 7 : Generalized Anxiety Score 12/27/2019 01/06/2016  Nervous, Anxious, on Edge 0 3  Control/stop worrying 0 1  Worry too much - different things 1 1  Trouble relaxing 1 1  Restless 0 1  Easily annoyed or irritable 1 0  Afraid - awful might happen 1 0  Total GAD 7 Score 4 7  Anxiety Difficulty Not difficult at all -   Patient Active Problem List   Diagnosis Date Noted  . Palpitations 12/11/2019  . Vegetarian diet 10/04/2019  . Urinary incontinence 10/03/2019  . Pruritus 10/03/2019  . Mixed hyperlipidemia 10/09/2018  . Chronic knee pain 10/03/2018  . Welcome to Medicare preventive visit 10/03/2018  . Paroxysmal  atrial fibrillation ([place]) 10/01/2017  . Vitamin D deficiency 03/01/2015  . Obesity (BMI 35.0-39.9 without comorbidity) 03/01/2015    Social History   Tobacco Use  . Smoking status: Former Smoker    Years: 25.00    Types: Cigarettes  . Smokeless tobacco: Never Used  . Tobacco comment: smoked off and on for about 25 years  Substance Use Topics  . Alcohol use: Yes    Alcohol/week: 0.0 standard drinks    Comment: seldom    Current Outpatient Medications:  .  Ascorbic Acid (VITAMIN C PO), Take 1 tablet by mouth daily., Disp: , Rfl:  .  BIOTIN PO, Take 1 tablet by mouth daily., Disp: , Rfl:  .  Calcium-Magnesium (CAL-MAG PO), Take 1 tablet by mouth daily., Disp: , Rfl:  .  Cholecalciferol  (DIALYVITE VITAMIN D 5000 PO), Take 1 tablet by mouth daily., Disp: , Rfl:  .  Cyanocobalamin (VITAMIN B 12 PO), Take 1 tablet by mouth daily., Disp: , Rfl:  .  mirabegron ER (MYRBETRIQ) 25 MG TB24 tablet, Take 25 mg by mouth daily., Disp: , Rfl:  .  Multiple Vitamin (MULTIVITAMIN) tablet, Take 1 tablet by mouth daily., Disp: , Rfl:  .  Omega 3 1000 MG CAPS, Take 1 capsule by mouth daily. , Disp: , Rfl:  .  co-enzyme Q-10 30 MG capsule, Take 30 mg by mouth 3 (three) times daily., Disp: , Rfl:  .  LORazepam (ATIVAN) 0.5 MG tablet, Take 0.5-1 tablets (0.25-0.5 mg total) by mouth daily as needed for anxiety., Disp: 30 tablet, Rfl: 5  No Known Allergies  OBJECTIVE: Ht 5\' 6"  (1.676 m)   Wt 199 lb (90.3 kg)   LMP 04/28/2012   BMI 32.12 kg/m  Gen: No acute distress. Nontoxic in appearance.  HENT: AT. [place].  MMM.  Eyes:Pupils Equal Round Reactive to light, Extraocular movements intact,  Conjunctiva without redness, discharge or icterus. Chest: Cough or shortness of breath not present  Neuro: Alert. Oriented x3  Psych: Normal affect and demeanor. Normal speech. Normal thought content and judgment.  ASSESSMENT AND PLAN: [name] is a 69 y.o. female present for  Anxiety -new problem. Lengthy conversation with patient today surrounding her anxiety.  We discussed different classes of medications and therapy to help relieve her stress level. -She is agreeable to referral to psychology/therapy.  Order has been placed for her. -She is agreeable to Ativan 0.25 -0.5 mg daily as needed.  She understands the medication class has addictive properties and is a controlled substance. -[place] controlled substance database was reviewed today. -Face-to-face follow-up needed if needing any additional refills.   Howard Pouch, DO 12/27/2019    Orders Placed This Encounter  Procedures  . Ambulatory referral to Psychology   Meds ordered this encounter  Medications  . LORazepam (ATIVAN) 0.5  MG tablet    Sig: Take 0.5-1 tablets (0.25-0.5 mg total) by mouth daily as needed for anxiety.    Dispense:  30 tablet    Refill:  5    Referral Orders     Ambulatory referral to Psychology

## 2020-01-05 ENCOUNTER — Telehealth: Payer: Self-pay | Admitting: Internal Medicine

## 2020-01-05 NOTE — Telephone Encounter (Signed)
Patient requesting a call from [name]. She would not state what it was about but said she needed a call back ASAP.Marland Kitchenshe has a loop recorder.

## 2020-01-05 NOTE — Telephone Encounter (Signed)
Returned call to patient. LMOVM with direct DC phone number and office hours.

## 2020-01-10 NOTE — Telephone Encounter (Signed)
Spoke with patient. Discussed ILR purpose and reassured pt of normal device function. Pt aware of upcoming f/u with Maximino Greenland, NP, on 01/18/20. Encouraged pt to keep a list of questions about her AF to discuss at this visit. Pt in agreement with plan and verbalizes appreciation of call.

## 2020-01-12 ENCOUNTER — Ambulatory Visit (INDEPENDENT_AMBULATORY_CARE_PROVIDER_SITE_OTHER): Payer: Medicare HMO | Admitting: *Deleted

## 2020-01-12 DIAGNOSIS — I480 Paroxysmal atrial fibrillation: Secondary | ICD-10-CM | POA: Diagnosis not present

## 2020-01-12 LAB — CUP PACEART REMOTE DEVICE CHECK
Date Time Interrogation Session: 20210312090136
Implantable Pulse Generator Implant Date: 20210208

## 2020-01-12 NOTE — Progress Notes (Signed)
ILR Remote 

## 2020-01-18 ENCOUNTER — Encounter (HOSPITAL_COMMUNITY): Payer: Self-pay | Admitting: Nurse Practitioner

## 2020-01-18 ENCOUNTER — Ambulatory Visit (HOSPITAL_COMMUNITY)
Admission: RE | Admit: 2020-01-18 | Discharge: 2020-01-18 | Disposition: A | Discharge status: Home / Self Care | Payer: Medicare HMO | Source: Ambulatory Visit | Attending: Nurse Practitioner | Admitting: Nurse Practitioner

## 2020-01-18 ENCOUNTER — Other Ambulatory Visit: Payer: Self-pay

## 2020-01-18 VITALS — BP 132/78 | HR 62 | Ht 66.0 in | Wt 198.0 lb

## 2020-01-18 DIAGNOSIS — I480 Paroxysmal atrial fibrillation: Secondary | ICD-10-CM | POA: Diagnosis not present

## 2020-01-18 DIAGNOSIS — E663 Overweight: Secondary | ICD-10-CM | POA: Insufficient documentation

## 2020-01-18 DIAGNOSIS — E785 Hyperlipidemia, unspecified: Secondary | ICD-10-CM | POA: Diagnosis not present

## 2020-01-18 DIAGNOSIS — Z87891 Personal history of nicotine dependence: Secondary | ICD-10-CM | POA: Diagnosis not present

## 2020-01-18 DIAGNOSIS — Z79899 Other long term (current) drug therapy: Secondary | ICD-10-CM | POA: Diagnosis not present

## 2020-01-18 DIAGNOSIS — F419 Anxiety disorder, unspecified: Secondary | ICD-10-CM | POA: Diagnosis not present

## 2020-01-18 DIAGNOSIS — R011 Cardiac murmur, unspecified: Secondary | ICD-10-CM | POA: Diagnosis not present

## 2020-01-18 NOTE — Progress Notes (Signed)
Primary Care Physician: Ma Hillock, DO Referring Physician: Dr. Redge Gainer [name] is a 69 y.o. female with a h/o paroxysmal afib that is in the clinic as f/u from  last visit with Dr.Allred.  She recently had a Linq inserted and is in the afib clinic for f/u. Linq shows 40 episodes of afib since linq inserted.  CHA2DS2VASc score of 2, she is not on anticoagulation. She currently is not on daily rate control.  Pt is very unhappy since getting the linq. She is mad at herself for not asking questions about it. It made her super anxious to the point she had to go on anxiety meds. "With all the lasers, satellites out there, all that power/battery in my body, being tracked like this, I just want  it out of my body asap. She thinks it has triggered more afib. She also feels that gaining weight has triggered more afib and now is exercising, and following [facility] weight loss and has lost 36 lbs. She wants to lose another 50 lbs.   Today, she denies symptoms of palpitations, chest pain, shortness of breath, orthopnea, PND, lower extremity edema, dizziness, presyncope, syncope, or neurologic sequela. The patient is tolerating medications without difficulties and is otherwise without complaint today.   Past Medical History:  Diagnosis Date  . Acute conjunctivitis of both eyes 04/05/2019  . Allergy   . Anxiety   . Arthritis   . Chicken pox   . Colon polyp   . Essential hypertension 10/03/2018   patient denies diagnosis of afib  . Fibroids    Uterine  . GERD (gastroesophageal reflux disease)   . Heart murmur   . History of colon polyps   . Hyperlipidemia   . Overweight    Past Surgical History:  Procedure Laterality Date  . implantable loop recorder placement  12/11/2019    Medtronic Reveal Linq model LNQ 2 [id number]) implantable loop recorder  . NOSE SURGERY    . ROTATOR CUFF REPAIR    . TONSILLECTOMY  1970  . WISDOM TOOTH EXTRACTION      Current Outpatient Medications   Medication Sig Dispense Refill  . Ascorbic Acid (VITAMIN C PO) Take 1 tablet by mouth daily.    Marland Kitchen BIOTIN PO Take 1 tablet by mouth daily.    . Calcium-Magnesium (CAL-MAG PO) Take 1 tablet by mouth daily.    . Cholecalciferol (DIALYVITE VITAMIN D 5000 PO) Take 1 tablet by mouth daily.    Marland Kitchen co-enzyme Q-10 30 MG capsule Take 30 mg by mouth 3 (three) times daily.    . Cyanocobalamin (VITAMIN B 12 PO) Take 1 tablet by mouth daily.    Marland Kitchen LORazepam (ATIVAN) 0.5 MG tablet Take 0.5-1 tablets (0.25-0.5 mg total) by mouth daily as needed for anxiety. 30 tablet 5  . mirabegron ER (MYRBETRIQ) 25 MG TB24 tablet Take 25 mg by mouth daily.    . Multiple Vitamin (MULTIVITAMIN) tablet Take 1 tablet by mouth daily.    . Omega 3 1000 MG CAPS Take 1 capsule by mouth daily.      No current facility-administered medications for this encounter.    No Known Allergies  Social History   Socioeconomic History  . Marital status: Single    Spouse name: Not on file  . Number of children: Not on file  . Years of education: Not on file  . Highest education level: Not on file  Occupational History  . Not on file  Tobacco Use  .  Smoking status: Former Smoker    Years: 25.00    Types: Cigarettes  . Smokeless tobacco: Never Used  . Tobacco comment: smoked off and on for about 25 years  Substance and Sexual Activity  . Alcohol use: Yes    Alcohol/week: 0.0 standard drinks    Comment: seldom  . Drug use: No  . Sexual activity: Yes    Partners: Male  Other Topics Concern  . Not on file  Social History Narrative   Marital status/children/pets: Single.    Education/employment: retired Librarian, academic:      -smoke alarm in the home:Yes     - wears seatbelt: Yes     - Feels safe in their relationships: Yes   Social Determinants of Health   Financial Resource Strain:   . Difficulty of Paying Living Expenses:   Food Insecurity:   . Worried About Charity fundraiser in the Last Year:   .  Arboriculturist in the Last Year:   Transportation Needs:   . Film/video editor (Medical):   Marland Kitchen Lack of Transportation (Non-Medical):   Physical Activity:   . Days of Exercise per Week:   . Minutes of Exercise per Session:   Stress:   . Feeling of Stress :   Social Connections:   . Frequency of Communication with Friends and Family:   . Frequency of Social Gatherings with Friends and Family:   . Attends Religious Services:   . Active Member of Clubs or Organizations:   . Attends Archivist Meetings:   Marland Kitchen Marital Status:   Intimate Partner Violence:   . Fear of Current or Ex-Partner:   . Emotionally Abused:   Marland Kitchen Physically Abused:   . Sexually Abused:     Family History  Problem Relation Age of Onset  . Hearing loss Mother   . Macular degeneration Mother   . Alzheimer's disease Father   . Hypertension Father   . Heart disease Father   . Stomach cancer Maternal Grandmother   . Ovarian cancer Maternal Grandmother   . Emphysema Maternal Grandfather   . Colon cancer Maternal Uncle   . Lung cancer Sister   . Heart disease Brother   . Esophageal cancer Neg Hx   . Rectal cancer Neg Hx   . Breast cancer Neg Hx     ROS- All systems are reviewed and negative except as per the HPI above  Physical Exam: There were no vitals filed for this visit. Wt Readings from Last 3 Encounters:  12/27/19 90.3 kg  12/21/19 92.1 kg  12/11/19 94.4 kg    Labs: Lab Results  Component Value Date   NA 141 10/07/2018   K 4.2 10/07/2018   CL 105 10/07/2018   CO2 25 10/07/2018   GLUCOSE 94 10/07/2018   BUN 24 (H) 10/07/2018   CREATININE 0.90 10/07/2018   CALCIUM 9.5 10/07/2018   No results found for: INR Lab Results  Component Value Date   CHOL 240 (H) 10/07/2018   HDL 51.10 10/07/2018   LDLCALC 159 (H) 10/07/2018   TRIG 151.0 (H) 10/07/2018     GEN- The patient is well appearing, alert and oriented x 3 today.   Head- normocephalic, atraumatic Eyes-  Sclera clear,  conjunctiva pink Ears- hearing intact Oropharynx- clear Neck- supple, no JVP Lymph- no cervical lymphadenopathy Lungs- Clear to ausculation bilaterally, normal work of breathing Heart- Regular rate and rhythm, no murmurs, rubs or gallops, PMI not laterally displaced  GI- soft, NT, ND, + BS Extremities- no clubbing, cyanosis, or edema MS- no significant deformity or atrophy Skin- no rash or lesion Psych- euthymic mood, full affect Neuro- strength and sensation are intact  EKG-NSR at 74 bpm, PR int 146 ms, qrs int 72 ms, qtc 419 ms Epic records reviewed   Assessment and Plan: 1. Paroxysmal afib General education re afib and monitor  Tried to reassure pt that monitor will not cause her any harm and pt 's feel reassured that their afib is being monitored/managed and they do to have to worry as much with linq in  She  wants it out ASAP, causing great anxiety  Currently chadsvasc score is 2 and by guidelines does not require anticoagulation, she has preferred to not be on anticoagulation   Message to Dr. Jackalyn Lombard office to  get on his schedule asap per pt's request    Butch Penny C. Finnbar Cedillos, [facility] [address] [place], [place] [address] [phone]

## 2020-01-25 ENCOUNTER — Telehealth: Payer: Self-pay

## 2020-01-25 ENCOUNTER — Encounter: Payer: Self-pay | Admitting: Internal Medicine

## 2020-01-25 ENCOUNTER — Other Ambulatory Visit: Payer: Self-pay

## 2020-01-25 ENCOUNTER — Ambulatory Visit: Payer: Medicare HMO | Admitting: Internal Medicine

## 2020-01-25 VITALS — BP 128/78 | HR 58 | Ht 66.0 in | Wt 208.0 lb

## 2020-01-25 DIAGNOSIS — R002 Palpitations: Secondary | ICD-10-CM

## 2020-01-25 DIAGNOSIS — I480 Paroxysmal atrial fibrillation: Secondary | ICD-10-CM

## 2020-01-25 DIAGNOSIS — G4733 Obstructive sleep apnea (adult) (pediatric): Secondary | ICD-10-CM

## 2020-01-25 HISTORY — PX: OTHER SURGICAL HISTORY: SHX169

## 2020-01-25 LAB — CUP PACEART INCLINIC DEVICE CHECK
Date Time Interrogation Session: 20210325113949
Implantable Pulse Generator Implant Date: 20210208

## 2020-01-25 NOTE — Telephone Encounter (Signed)
I called the pt to verify her address so she can send her monitor back. The pt states she never had a monitor she had the app. I told her she can delete the app since she no longer have the lnq22

## 2020-01-25 NOTE — Patient Instructions (Addendum)
Medication Instructions:  Your physician recommends that you continue on your current medications as directed. Please refer to the Current Medication list given to you today.  Labwork: None ordered.  Testing/Procedures: None ordered.  Follow-Up:  Your physician wants you to follow-up in: 3 months with Afib clinic.   Implantable Loop Recorder Placement, Care After Refer to this sheet in the next few weeks. These instructions provide you with information about caring for yourself after your procedure. Your health care provider may also give you more specific instructions. Your treatment has been planned according to current medical practices, but problems sometimes occur. Call your health care provider if you have any problems or questions after your procedure. What can I expect after the procedure? After the procedure, it is common to have:  Soreness or pain near the cut from surgery (incision).  Some swelling or bruising near the incision.  Follow these instructions at home:  Medicines  Take over-the-counter and prescription medicines only as told by your health care provider.  If you were prescribed an antibiotic medicine, take it as told by your health care provider. Do not stop taking the antibiotic even if you start to feel better.  Bathing Do not take baths, swim, or use a hot tub until your health care provider approves. You may shower 24 hours after removal of your monitor.  Incision care  Follow instructions from your health care provider about how to take care of your incision. Make sure you: ? Remove your top dressing after 24 hours (before you shower) ? Leave stitches (sutures), skin glue, or adhesive strips in place. These skin closures may need to stay in place for 2 weeks or longer. If adhesive strip edges start to loosen and curl up, you may trim the loose edges. Do not remove adhesive strips completely unless your health care provider tells you to do  that.  Check your incision area every day for signs of infection. Check for: ? More redness, swelling, or pain. ? Fluid or blood. ? Warmth. ? Pus or a bad smell.  Contact a health care provider if:  You have more redness, swelling, or pain around your incision.  You have more fluid or blood coming from your incision.  Your incision feels warm to the touch.  You have pus or a bad smell coming from your incision.  You have a fever.  You have pain that is not relieved by your pain medicine.  You have triggered your device because of fainting (syncope) or because of a heartbeat that feels like it is racing, slow, fluttering, or skipping (palpitations).

## 2020-01-25 NOTE — Progress Notes (Signed)
PCP: Ma Hillock, DO   Primary EP: Dr Redge Gainer [name] is a 69 y.o. female who presents today for routine electrophysiology followup.  Since last being seen in our clinic, the patient reports doing reasonably well.  She has developed substantial anxiety and paranoia's regarding her loop recorder.  She has decided that she would like to have it removed.  She is not having palpitations but is very concerned with fear of having something "implanted under (her) skin".   Today, she denies symptoms of palpitations, chest pain, shortness of breath,  lower extremity edema, dizziness, presyncope, or syncope.  The patient is otherwise without complaint today.   Past Medical History:  Diagnosis Date  . Acute conjunctivitis of both eyes 04/05/2019  . Allergy   . Anxiety   . Arthritis   . Chicken pox   . Colon polyp   . Essential hypertension 10/03/2018   patient denies diagnosis of afib  . Fibroids    Uterine  . GERD (gastroesophageal reflux disease)   . Heart murmur   . History of colon polyps   . Hyperlipidemia   . Overweight    Past Surgical History:  Procedure Laterality Date  . implantable loop recorder placement  12/11/2019    Medtronic Reveal Linq model LNQ [id number]) implantable loop recorder  . NOSE SURGERY    . ROTATOR CUFF REPAIR    . TONSILLECTOMY  1970  . WISDOM TOOTH EXTRACTION      ROS- all systems are reviewed and negatives except as per HPI above  Current Outpatient Medications  Medication Sig Dispense Refill  . Ascorbic Acid (VITAMIN C PO) Take 1 tablet by mouth daily.    Marland Kitchen BIOTIN PO Take 1 tablet by mouth daily.    . Calcium-Magnesium (CAL-MAG PO) Take 1 tablet by mouth daily.    . Cholecalciferol (DIALYVITE VITAMIN D 5000 PO) Take 1 tablet by mouth daily. Taking 2000 units daily    . co-enzyme Q-10 30 MG capsule Take 30 mg by mouth 3 (three) times daily.    . Cyanocobalamin (VITAMIN B 12 PO) Take 1 tablet by mouth daily.    Marland Kitchen LORazepam (ATIVAN)  0.5 MG tablet Take 0.5-1 tablets (0.25-0.5 mg total) by mouth daily as needed for anxiety. 30 tablet 5  . Multiple Vitamin (MULTIVITAMIN) tablet Take 1 tablet by mouth daily.    . Omega 3 1000 MG CAPS Take 1 capsule by mouth daily.      No current facility-administered medications for this visit.    Physical Exam: Vitals:   01/25/20 0915  BP: 128/78  Pulse: (!) 58  SpO2: 97%  Weight: 208 lb (94.3 kg)  Height: 5\' 6"  (1.676 m)    GEN- The patient is well appearing, alert and oriented x 3 today.   Head- normocephalic, atraumatic Eyes-  Sclera clear, conjunctiva pink Ears- hearing intact Oropharynx- clear Lungs-   normal work of breathing Heart- Regular rate and rhythm  GI- soft  Extremities- no clubbing, cyanosis, or edema Psych- anxious  Wt Readings from Last 3 Encounters:  01/25/20 208 lb (94.3 kg)  01/18/20 198 lb (89.8 kg)  12/27/19 199 lb (90.3 kg)   ILR reveals afib burden of 6 %  Assessment and Plan:  1. Paroxysmal atrial fibrillation Burden is 6% with occasional RVR chads2vasc score is 2.  We discussed updates to guidelines which state that she does not require long term anticoagulation at this time but could potentially benefit.  We discussed pros  and cons to anticoagulation at length.  At this time, she is clear that she would like to avoid this.  We discussed importance of regular exercise and weight loss to prevent afib progression  She is very clear that she would like to have her ILR removed today.  She states "I cannot live with this in me".  Risks and benefits to ILR removal including bleeding and infection were discussed today.  She understands risks and wishes to proceed.  2. Obesity Body mass index is 33.57 kg/m. Lifestyle modification is advised  3. Mild OSA She has not followed up as advised We discussed that with untreated OSA, her afib wil likely worsen  followup in AF clinic every 3 months I will see when needed  Thompson Grayer MD,  [facility] 01/25/2020 9:42 AM    PROCEDURES:   1. Implantable loop recorder explantation        DESCRIPTION OF PROCEDURE:  Informed written consent was obtained.  The patient required no sedation for the procedure today.   The patients left chest was therefore prepped and draped in the usual sterile fashion.  The skin overlying the ILR monitor was infiltrated with lidocaine for local analgesia.  A 0.5-cm incision was made over the inframammary site.  The previously implanted ILR was exposed and removed using a combination of sharp and blunt dissection.  Steri- Strips and a sterile dressing were then applied. EBL<49ml.  There were no early apparent complications.     CONCLUSIONS:   1. Successful explantation of a Medtronic Reveal LINQ implantable loop recorder   2. No early apparent complications.        Thompson Grayer MD, [facility] 01/25/2020 9:46 AM

## 2020-02-02 DIAGNOSIS — N951 Menopausal and female climacteric states: Secondary | ICD-10-CM | POA: Diagnosis not present

## 2020-02-06 DIAGNOSIS — R232 Flushing: Secondary | ICD-10-CM | POA: Diagnosis not present

## 2020-02-06 DIAGNOSIS — R4586 Emotional lability: Secondary | ICD-10-CM | POA: Diagnosis not present

## 2020-02-06 DIAGNOSIS — N951 Menopausal and female climacteric states: Secondary | ICD-10-CM | POA: Diagnosis not present

## 2020-02-06 DIAGNOSIS — R6882 Decreased libido: Secondary | ICD-10-CM | POA: Diagnosis not present

## 2020-02-06 DIAGNOSIS — Z1339 Encounter for screening examination for other mental health and behavioral disorders: Secondary | ICD-10-CM | POA: Diagnosis not present

## 2020-02-06 DIAGNOSIS — G479 Sleep disorder, unspecified: Secondary | ICD-10-CM | POA: Diagnosis not present

## 2020-02-06 DIAGNOSIS — Z6832 Body mass index (BMI) 32.0-32.9, adult: Secondary | ICD-10-CM | POA: Diagnosis not present

## 2020-02-06 DIAGNOSIS — Z1331 Encounter for screening for depression: Secondary | ICD-10-CM | POA: Diagnosis not present

## 2020-02-06 DIAGNOSIS — R5382 Chronic fatigue, unspecified: Secondary | ICD-10-CM | POA: Diagnosis not present

## 2020-02-20 DIAGNOSIS — R5382 Chronic fatigue, unspecified: Secondary | ICD-10-CM | POA: Diagnosis not present

## 2020-02-20 DIAGNOSIS — Z6832 Body mass index (BMI) 32.0-32.9, adult: Secondary | ICD-10-CM | POA: Diagnosis not present

## 2020-02-27 DIAGNOSIS — Z6832 Body mass index (BMI) 32.0-32.9, adult: Secondary | ICD-10-CM | POA: Diagnosis not present

## 2020-02-27 DIAGNOSIS — R232 Flushing: Secondary | ICD-10-CM | POA: Diagnosis not present

## 2020-02-29 ENCOUNTER — Ambulatory Visit: Payer: Medicare HMO | Admitting: Pulmonary Disease

## 2020-03-05 DIAGNOSIS — Z6831 Body mass index (BMI) 31.0-31.9, adult: Secondary | ICD-10-CM | POA: Diagnosis not present

## 2020-03-05 DIAGNOSIS — R5382 Chronic fatigue, unspecified: Secondary | ICD-10-CM | POA: Diagnosis not present

## 2020-03-19 DIAGNOSIS — Z6832 Body mass index (BMI) 32.0-32.9, adult: Secondary | ICD-10-CM | POA: Diagnosis not present

## 2020-03-19 DIAGNOSIS — R5382 Chronic fatigue, unspecified: Secondary | ICD-10-CM | POA: Diagnosis not present

## 2020-04-09 DIAGNOSIS — Z6831 Body mass index (BMI) 31.0-31.9, adult: Secondary | ICD-10-CM | POA: Diagnosis not present

## 2020-04-09 DIAGNOSIS — R5382 Chronic fatigue, unspecified: Secondary | ICD-10-CM | POA: Diagnosis not present

## 2020-04-13 ENCOUNTER — Encounter: Payer: Self-pay | Admitting: Family Medicine

## 2020-04-13 DIAGNOSIS — Z1231 Encounter for screening mammogram for malignant neoplasm of breast: Secondary | ICD-10-CM | POA: Diagnosis not present

## 2020-04-15 ENCOUNTER — Telehealth (INDEPENDENT_AMBULATORY_CARE_PROVIDER_SITE_OTHER): Payer: Medicare HMO | Admitting: Family Medicine

## 2020-04-15 ENCOUNTER — Encounter: Payer: Self-pay | Admitting: Family Medicine

## 2020-04-15 DIAGNOSIS — N959 Unspecified menopausal and perimenopausal disorder: Secondary | ICD-10-CM

## 2020-04-15 DIAGNOSIS — R635 Abnormal weight gain: Secondary | ICD-10-CM | POA: Diagnosis not present

## 2020-04-15 DIAGNOSIS — R232 Flushing: Secondary | ICD-10-CM | POA: Diagnosis not present

## 2020-04-15 MED ORDER — VENLAFAXINE HCL ER 37.5 MG PO CP24: ORAL_CAPSULE | ORAL | 1 refills | Status: DC

## 2020-04-15 NOTE — Progress Notes (Signed)
VIRTUAL VISIT VIA VIDEO  I connected with [name] on 04/16/20 at  3:00 PM EDT by elemedicine application and verified that I am speaking with the correct person using two identifiers. Location patient: Home Location provider: [facility], Office Persons participating in the virtual visit: Patient, Dr. Raoul Pitch and R.Baker, LPN  I discussed the limitations of evaluation and management by telemedicine and the availability of in person appointments. The patient expressed understanding and agreed to proceed.   SUBJECTIVE Chief Complaint  Patient presents with  . would like to discuss hormone therapy    would like to talk [facility] MD    HPI: [name] is a 69 y.o. female present today to discuss hormone replacement therapy.  She has been seen at "[facility] "integrative medicine to help her with weight loss.  They have been discussing starting hormone replacement therapy for her.  She has reservations of starting hormone replacement therapy and was wondering if it is a safe option for her.  She does have postmenopausal hot flashes she was hoping to get relief from with the hormones as well as to help with her weight loss.  ROS: See pertinent positives and negatives per HPI.  Patient Active Problem List   Diagnosis Date Noted  . Hot flashes 04/16/2020  . Gain of weight 04/16/2020  . Menopausal disorder 04/16/2020  . Palpitations 12/11/2019  . Vegetarian diet 10/04/2019  . Urinary incontinence 10/03/2019  . Pruritus 10/03/2019  . Mixed hyperlipidemia 10/09/2018  . Chronic knee pain 10/03/2018  . Welcome to Medicare preventive visit 10/03/2018  . Paroxysmal atrial fibrillation ([place]) 10/01/2017  . Vitamin D deficiency 03/01/2015  . Obesity (BMI 35.0-39.9 without comorbidity) 03/01/2015    Social History   Tobacco Use  . Smoking status: Former Smoker    Years: 25.00    Types: Cigarettes  . Smokeless tobacco: Never Used  . Tobacco comment: smoked off and on  for about 25 years  Substance Use Topics  . Alcohol use: Yes    Alcohol/week: 0.0 standard drinks    Comment: seldom    Current Outpatient Medications:  .  Ascorbic Acid (VITAMIN C PO), Take 1 tablet by mouth daily., Disp: , Rfl:  .  BIOTIN PO, Take 1 tablet by mouth daily., Disp: , Rfl:  .  Calcium-Magnesium (CAL-MAG PO), Take 1 tablet by mouth daily., Disp: , Rfl:  .  Cholecalciferol (DIALYVITE VITAMIN D 5000 PO), Take 1 tablet by mouth daily. Taking 2000 units daily, Disp: , Rfl:  .  co-enzyme Q-10 30 MG capsule, Take 30 mg by mouth 3 (three) times daily., Disp: , Rfl:  .  Cyanocobalamin (VITAMIN B 12 PO), Take 1 tablet by mouth daily., Disp: , Rfl:  .  LORazepam (ATIVAN) 0.5 MG tablet, Take 0.5-1 tablets (0.25-0.5 mg total) by mouth daily as needed for anxiety., Disp: 30 tablet, Rfl: 5 .  Multiple Vitamin (MULTIVITAMIN) tablet, Take 1 tablet by mouth daily., Disp: , Rfl:  .  Omega 3 1000 MG CAPS, Take 1 capsule by mouth daily. , Disp: , Rfl:  .  venlafaxine XR (EFFEXOR XR) 37.5 MG 24 hr capsule, Take 1 capsule (37.5 mg total) by mouth daily with breakfast. For 7 days- then start 2nd script of effexor, Disp: 7 capsule, Rfl: 0 .  venlafaxine XR (EFFEXOR XR) 75 MG 24 hr capsule, Take 1 capsule (75 mg total) by mouth daily with breakfast. (After finishing 1 week of effexor 37.5 mg tab QD), Disp: 90  capsule, Rfl: 0  No Known Allergies  OBJECTIVE: LMP 04/28/2012  Gen: No acute distress. Nontoxic in appearance.  HENT: AT. [place].  MMM.  Eyes:Pupils Equal Round Reactive to light, Extraocular movements intact,  Conjunctiva without redness, discharge or icterus. Neuro:  Normal gait. Alert. Oriented x3  Psych: Normal affect and demeanor. Normal speech. Normal thought content and judgment.  ASSESSMENT AND PLAN: Shirin Echeverry is a 69 y.o. female present for  Hot flashes/weight gain/menopausal disorder Lengthy conversation today surrounding her constellation of symptoms.  She is a 69 year old  female and although she has no personal history of breast cancer or family history of breast cancer, she does have a strong family history of heart disease.  Discussed benefits versus risks with hormone therapy with her today.  She did decide she did not want to move forward with hormone therapy replacement. We discussed other treatment options to help her with her hot flashes that would not be hormone related.  She would like to start this today. Start Effexor 37.5 mg daily x7 days, then increase to Effexor 75 mg daily.  Prescribed today. Her mammogram is up-to-date and normal. Follow-up in 5-6 weeks.   Howard Pouch, DO 04/16/2020   Return in about 5 weeks (around 05/20/2020).   Referral Orders  No referral(s) requested today

## 2020-04-15 NOTE — Patient Instructions (Signed)
Follow up in 5-6 weeks.  Please follow label on instructions.

## 2020-04-16 ENCOUNTER — Telehealth: Payer: Self-pay

## 2020-04-16 DIAGNOSIS — N959 Unspecified menopausal and perimenopausal disorder: Secondary | ICD-10-CM | POA: Insufficient documentation

## 2020-04-16 DIAGNOSIS — R635 Abnormal weight gain: Secondary | ICD-10-CM | POA: Insufficient documentation

## 2020-04-16 DIAGNOSIS — R232 Flushing: Secondary | ICD-10-CM | POA: Insufficient documentation

## 2020-04-16 MED ORDER — VENLAFAXINE HCL ER 75 MG PO CP24: 75.0000 mg | ORAL_CAPSULE | Freq: Every day | ORAL | 0 refills | Status: DC

## 2020-04-16 MED ORDER — VENLAFAXINE HCL ER 37.5 MG PO CP24: 37.5000 mg | ORAL_CAPSULE | Freq: Every day | ORAL | 0 refills | Status: DC

## 2020-04-16 NOTE — Telephone Encounter (Signed)
Patient advised of results and new medication instructions, voiced understanding.

## 2020-04-16 NOTE — Addendum Note (Signed)
Addended by: Howard Pouch A on: 04/16/2020 11:38 AM   Modules accepted: Orders

## 2020-04-16 NOTE — Telephone Encounter (Signed)
Received fax from Pharmacy stating pts insurance will not pay for Effexor 37.5mg , 2 capsules daily and will need a RX for 75mg  capsule daily.   [facility]  Please advise

## 2020-04-16 NOTE — Telephone Encounter (Signed)
Please inform patient the following information: Mammogram was normal.  I have called in the effexor 37.5mg  QD x7 days. 2nd script also called in for effexor 75 mg QD to start on day 8.

## 2020-04-16 NOTE — Telephone Encounter (Signed)
[facility] report received. Completed mammogram on 04/13/2020.  Bi-RADS Cat 1: Neg  Placed on Dr Dierdre Highman desk to review.

## 2020-04-23 DIAGNOSIS — R5382 Chronic fatigue, unspecified: Secondary | ICD-10-CM | POA: Diagnosis not present

## 2020-04-23 DIAGNOSIS — G479 Sleep disorder, unspecified: Secondary | ICD-10-CM | POA: Diagnosis not present

## 2020-04-23 DIAGNOSIS — R232 Flushing: Secondary | ICD-10-CM | POA: Diagnosis not present

## 2020-04-23 DIAGNOSIS — Z713 Dietary counseling and surveillance: Secondary | ICD-10-CM | POA: Diagnosis not present

## 2020-04-23 DIAGNOSIS — N951 Menopausal and female climacteric states: Secondary | ICD-10-CM | POA: Diagnosis not present

## 2020-04-23 DIAGNOSIS — Z6832 Body mass index (BMI) 32.0-32.9, adult: Secondary | ICD-10-CM | POA: Diagnosis not present

## 2020-05-19 DIAGNOSIS — H1031 Unspecified acute conjunctivitis, right eye: Secondary | ICD-10-CM | POA: Diagnosis not present

## 2020-05-24 DIAGNOSIS — G479 Sleep disorder, unspecified: Secondary | ICD-10-CM | POA: Diagnosis not present

## 2020-05-24 DIAGNOSIS — N951 Menopausal and female climacteric states: Secondary | ICD-10-CM | POA: Diagnosis not present

## 2020-05-24 DIAGNOSIS — R232 Flushing: Secondary | ICD-10-CM | POA: Diagnosis not present

## 2020-05-28 DIAGNOSIS — R6882 Decreased libido: Secondary | ICD-10-CM | POA: Diagnosis not present

## 2020-05-28 DIAGNOSIS — R5382 Chronic fatigue, unspecified: Secondary | ICD-10-CM | POA: Diagnosis not present

## 2020-05-28 DIAGNOSIS — R232 Flushing: Secondary | ICD-10-CM | POA: Diagnosis not present

## 2020-05-28 DIAGNOSIS — N951 Menopausal and female climacteric states: Secondary | ICD-10-CM | POA: Diagnosis not present

## 2020-06-06 ENCOUNTER — Other Ambulatory Visit: Payer: Self-pay

## 2020-06-06 ENCOUNTER — Ambulatory Visit (INDEPENDENT_AMBULATORY_CARE_PROVIDER_SITE_OTHER): Payer: Medicare HMO | Admitting: Family Medicine

## 2020-06-06 ENCOUNTER — Telehealth: Payer: Self-pay | Admitting: Family Medicine

## 2020-06-06 ENCOUNTER — Encounter: Payer: Self-pay | Admitting: Family Medicine

## 2020-06-06 VITALS — BP 129/82 | HR 75 | Temp 97.5°F | Ht 66.0 in | Wt 206.2 lb

## 2020-06-06 DIAGNOSIS — E559 Vitamin D deficiency, unspecified: Secondary | ICD-10-CM

## 2020-06-06 LAB — VITAMIN D 25 HYDROXY (VIT D DEFICIENCY, FRACTURES): VITD: 19.55 ng/mL — ABNORMAL LOW (ref 30.00–100.00)

## 2020-06-06 MED ORDER — VITAMIN D (ERGOCALCIFEROL) 1.25 MG (50000 UNIT) PO CAPS
50000.0000 [IU] | ORAL_CAPSULE | ORAL | 0 refills | Status: DC
Start: 2020-06-06 — End: 2020-08-20

## 2020-06-06 MED ORDER — VENLAFAXINE HCL ER 150 MG PO CP24: 150.0000 mg | ORAL_CAPSULE | Freq: Every day | ORAL | 5 refills | Status: DC

## 2020-06-06 NOTE — Patient Instructions (Signed)
Effexor increase to 150 mg a day.  New script called in with new dose at 30 Days at time (6 months worth)   Follow up in 5.5 months. Make appt for mid January.

## 2020-06-06 NOTE — Telephone Encounter (Signed)
Is inform patient her vitamin D levels are significantly low at 19. I have called in the high-dose once weekly vitamin D supplementation that should be taking with a meal once weekly for 12 weeks. Follow-up in 3 months for vitamin D recheck. This can be lab appointment only. Order has been placed for her.

## 2020-06-06 NOTE — Progress Notes (Signed)
SUBJECTIVE Chief Complaint  Patient presents with  . Hot Flashes    HPI: [name] is a 69 y.o. female present today to follow up on  Hot flashes, anxiety and appetite (see prior note for details). She was started on effexor taper 6 weeks ago.  She reports she did go ahead and get the testosterone pellet placed.  She is tolerating Effexor and has tapered up to the 75 mg Effexor daily.  She reports that the hot flashes have stopped.  She did have the pellet placed before tapering up to the Effexor 75 mg, therefore is uncertain if the Effexor would have resolved the hot flashes without the placement of the testosterone pellet.  She reports Effexor has worked on her anxiety quite nicely.  She did notice some appetite suppressant initially with the start of Effexor.  However she admits now she is not controlling her diet and cravings.  She is taking to exercise classes a day and she is routinely hiking. Prior note: She recently has been diagnosed with A. fib and is needing to wear a loop recorder for monitoring.  She feels this has contributed to her increased stress.  She does not like the idea of have to wear a loop recorder and having her daily life monitored.  She also reports she has some family dynamics contributing to her increased stress.  She states she feels like she is a positive person and tries to stay positive and feels that is important.  However she does admit that she also feels she is not addressing her anxiety and stress properly because she feels bad about not being is positive when she does so.  She reports she "wants to be in control of her life "and feels she is not as in control of her life with her new diagnosis of A. fib and her weight gain.  She reports she has a history of panic attacks that started when her significant other had a seizure in the middle the night a few years ago.  She exercises 3 times a week to help with her weight and her anxiety.  She attends Bible  studies 2 times a week, which she states helps a great deal. Prior meds: Vistaril, Xanax, Prozac 20 have been tried.  Patient states she does not want to take medication every day.  She reports a lot of time she feels better just knowing she has something to take if needed.  She does not want any medication that is "unhealthy."     Vitamin D deficiency: Patient reports she has a history of vitamin D deficiency and has not had her vitamin D level checked in some time she would like to have this checked today.  ROS: See pertinent positives and negatives per HPI.  Patient Active Problem List   Diagnosis Date Noted  . Hot flashes 04/16/2020  . Gain of weight 04/16/2020  . Menopausal disorder 04/16/2020  . Palpitations 12/11/2019  . Vegetarian diet 10/04/2019  . Urinary incontinence 10/03/2019  . Pruritus 10/03/2019  . Mixed hyperlipidemia 10/09/2018  . Chronic knee pain 10/03/2018  . Welcome to Medicare preventive visit 10/03/2018  . Paroxysmal atrial fibrillation ([place]) 10/01/2017  . Vitamin D deficiency 03/01/2015  . Obesity (BMI 35.0-39.9 without comorbidity) 03/01/2015    Social History   Tobacco Use  . Smoking status: Former Smoker    Years: 25.00    Types: Cigarettes  . Smokeless tobacco: Never Used  . Tobacco comment: smoked off and  on for about 25 years  Substance Use Topics  . Alcohol use: Yes    Alcohol/week: 0.0 standard drinks    Comment: seldom    Current Outpatient Medications:  .  Ascorbic Acid (VITAMIN C PO), Take 1 tablet by mouth daily., Disp: , Rfl:  .  BIOTIN PO, Take 1 tablet by mouth daily., Disp: , Rfl:  .  Calcium-Magnesium (CAL-MAG PO), Take 1 tablet by mouth daily., Disp: , Rfl:  .  Cholecalciferol (DIALYVITE VITAMIN D 5000 PO), Take 1 tablet by mouth daily. Taking 2000 units daily, Disp: , Rfl:  .  co-enzyme Q-10 30 MG capsule, Take 30 mg by mouth 3 (three) times daily., Disp: , Rfl:  .  Cyanocobalamin (VITAMIN B 12 PO), Take 1 tablet by mouth  daily., Disp: , Rfl:  .  Multiple Vitamin (MULTIVITAMIN) tablet, Take 1 tablet by mouth daily., Disp: , Rfl:  .  Omega 3 1000 MG CAPS, Take 1 capsule by mouth daily. , Disp: , Rfl:  .  venlafaxine XR (EFFEXOR XR) 150 MG 24 hr capsule, Take 1 capsule (150 mg total) by mouth daily with breakfast., Disp: 30 capsule, Rfl: 5 .  Vitamin D, Ergocalciferol, (DRISDOL) 1.25 MG (50000 UNIT) CAPS capsule, Take 1 capsule (50,000 Units total) by mouth every 7 (seven) days., Disp: 12 capsule, Rfl: 0  No Known Allergies  OBJECTIVE: BP 129/82 (BP Location: Right Arm, Patient Position: Sitting, Cuff Size: Normal)   Pulse 75   Temp (!) 97.5 F (36.4 C) (Temporal)   Ht 5\' 6"  (1.676 m)   Wt 206 lb 3.2 oz (93.5 kg)   LMP 04/28/2012   SpO2 96%   BMI 33.28 kg/m  Gen: Afebrile. No acute distress.  HENT: AT. [place].  Eyes:Pupils Equal Round Reactive to light, Extraocular movements intact,  Conjunctiva without redness, discharge or icterus. CV: RRR, no edema Chest: CTAB, no wheeze or crackles Neuro:  Normal gait. PERLA. EOMi. Alert. Oriented x3  Psych: Normal affect, dress and demeanor. Normal speech. Normal thought content and judgment..    ASSESSMENT AND PLAN: [name] is a 69 y.o. female present for  Hot flashes/weight gain/menopausal disorder/anxiety -She would like to try higher dose Effexor. -Continue Effexor at 150 mg daily -Follow-up 5.5 months on chronic conditions  Vitamin D deficiency: Vitamin D levels checked today   Howard Pouch, DO 06/06/2020   Return in about 6 months (around 11/26/2020) for [facility] (30 min). Meds ordered this encounter  Medications  . venlafaxine XR (EFFEXOR XR) 150 MG 24 hr capsule    Sig: Take 1 capsule (150 mg total) by mouth daily with breakfast.    Dispense:  30 capsule    Refill:  5   Orders Placed This Encounter  Procedures  . Vitamin D (25 hydroxy)     Referral Orders  No referral(s) requested today

## 2020-06-07 NOTE — Telephone Encounter (Signed)
Pt notified and lab appt scheduled.

## 2020-06-17 ENCOUNTER — Telehealth: Payer: Self-pay

## 2020-06-17 NOTE — Telephone Encounter (Signed)
Patient is requesting a call back from [place]. She has a medication question.

## 2020-06-18 NOTE — Telephone Encounter (Signed)
FYI Only:    Patient states that she believe effexor dose of 150mg  is making her feel dizzy and sleepy.  She states she feels better when taking it at 75mg  so she states she went back to 75mg  dose.  Patient not completely sure if it's the new dose of effexor or the new Vitamin D 50,000 Units daily.  She states she will continue on Effexor75 mg and take her vitamin D on Thursday and call us back if her symptoms persist.

## 2020-06-18 NOTE — Telephone Encounter (Signed)
Patient states she took Effexor 150mg  x 10 days, then switched back to her old RX Effexor 75mg  capsules.  She states that she has 42 total capsules remaining of 75mg  dose.

## 2020-06-18 NOTE — Telephone Encounter (Signed)
Noted. We will wait on her to call back in a few days to see if symptoms persist.  If she stays on the Effexor 75 mg we will refill for her after we hear back from her.

## 2020-06-18 NOTE — Telephone Encounter (Signed)
Noted. How did she "return" to 75 mg of effexor when her pill is 150 mg per capsule that was written on 06/06/2020?  If she is taking 2 capsules of new script written on 06/06/2020> then she was taking too much and was actually taking 300 mg of effexor which would cause her dizziness.   Please ask her to look on her bottle and confirm what she is taking and call us back.  Thanks.

## 2020-06-20 ENCOUNTER — Telehealth: Payer: Self-pay | Admitting: Family Medicine

## 2020-06-20 NOTE — Telephone Encounter (Signed)
Left message for patient to schedule Annual Wellness Visit.  Please schedule with Nurse Health Advisor Martha Stanley, RN at [place]  °

## 2020-06-21 NOTE — Telephone Encounter (Signed)
Patient states that she quit taking the effexor because it made her "feel funny". She started taking Herb that she got through [facility]. She is still taking the vitamin D

## 2020-06-27 ENCOUNTER — Other Ambulatory Visit: Payer: Self-pay

## 2020-06-27 DIAGNOSIS — Z20822 Contact with and (suspected) exposure to covid-19: Secondary | ICD-10-CM

## 2020-06-28 LAB — SARS-COV-2, NAA 2 DAY TAT

## 2020-06-28 LAB — NOVEL CORONAVIRUS, NAA: SARS-CoV-2, NAA: NOT DETECTED

## 2020-07-07 ENCOUNTER — Other Ambulatory Visit: Payer: Self-pay | Admitting: Family Medicine

## 2020-07-09 ENCOUNTER — Telehealth: Payer: Self-pay

## 2020-07-09 NOTE — Telephone Encounter (Signed)
Yes ma'am! 

## 2020-07-09 NOTE — Telephone Encounter (Signed)
Patient would like to have Bilateral gel injections. Is this okay?

## 2020-07-10 NOTE — Telephone Encounter (Signed)
Noted  

## 2020-07-15 ENCOUNTER — Other Ambulatory Visit: Payer: Self-pay

## 2020-07-15 ENCOUNTER — Telehealth: Payer: Self-pay

## 2020-07-15 ENCOUNTER — Ambulatory Visit (HOSPITAL_COMMUNITY)
Admission: RE | Admit: 2020-07-15 | Discharge: 2020-07-15 | Disposition: A | Discharge status: Home / Self Care | Payer: Medicare HMO | Source: Ambulatory Visit | Attending: Nurse Practitioner | Admitting: Nurse Practitioner

## 2020-07-15 ENCOUNTER — Encounter (HOSPITAL_COMMUNITY): Payer: Self-pay | Admitting: Nurse Practitioner

## 2020-07-15 VITALS — BP 132/86 | HR 120 | Ht 66.0 in | Wt 210.6 lb

## 2020-07-15 DIAGNOSIS — Z8249 Family history of ischemic heart disease and other diseases of the circulatory system: Secondary | ICD-10-CM | POA: Diagnosis not present

## 2020-07-15 DIAGNOSIS — E785 Hyperlipidemia, unspecified: Secondary | ICD-10-CM | POA: Insufficient documentation

## 2020-07-15 DIAGNOSIS — Z87891 Personal history of nicotine dependence: Secondary | ICD-10-CM | POA: Diagnosis not present

## 2020-07-15 DIAGNOSIS — I480 Paroxysmal atrial fibrillation: Secondary | ICD-10-CM | POA: Diagnosis not present

## 2020-07-15 DIAGNOSIS — I10 Essential (primary) hypertension: Secondary | ICD-10-CM | POA: Insufficient documentation

## 2020-07-15 DIAGNOSIS — Z79899 Other long term (current) drug therapy: Secondary | ICD-10-CM | POA: Diagnosis not present

## 2020-07-15 NOTE — Progress Notes (Signed)
Primary Care Physician: Ma Hillock, DO Referring Physician: Dr. Redge Gainer [name] is a 69 y.o. female with a h/o paroxysmal afib that is in the clinic as f/u from  last visit with Dr.Allred.  She recently had a Linq inserted and is in the afib clinic for f/u. Linq showed  40 episodes of afib since linq inserted.  CHA2DS2VASc score of 2, she is not on anticoagulation. She currently is not on daily rate control.  Pt is very unhappy since getting the linq. She is mad at herself for not asking questions about it. It made her super anxious to the point she had to go on anxiety meds. "With all the lasers, satellites out there, all that power/battery in my body, being tracked like this, I just want  it out of my body asap. She thinks it has triggered more afib. She also feels that gaining weight has triggered more afib and now is exercising, and following [facility] and has lost 36 lbs. She wants to lose another 50 lbs.   F/u in afib clinic, 9/13. She had her Linq  explanted in March by Dr. Rayann Heman. She is in afib clinic today in afib with v rate of 120 bpm, on auscultation did not sound that rapid. She states that she started thermogenics with [facility] this am and is now in afib. However she states that she does not feel afib. She  is not on rate control nor anticoagulation with a CHA2DS2VASc score of 2.  Recommended not to take weight loss supplements that contain stimulants as it can aggravate afib.   Today, she denies symptoms of palpitations, chest pain, shortness of breath, orthopnea, PND, lower extremity edema, dizziness, presyncope, syncope, or neurologic sequela. The patient is tolerating medications without difficulties and is otherwise without complaint today.   Past Medical History:  Diagnosis Date  . Acute conjunctivitis of both eyes 04/05/2019  . Allergy   . Anxiety   . Arthritis   . Chicken pox   . Colon polyp   . Essential hypertension 10/03/2018    patient denies diagnosis of afib  . Fibroids    Uterine  . GERD (gastroesophageal reflux disease)   . Heart murmur   . History of colon polyps   . Hyperlipidemia   . Overweight    Past Surgical History:  Procedure Laterality Date  . implantable loop recorder placement  12/11/2019    Medtronic Reveal Linq model LNQ 2 [id number]) implantable loop recorder  . implantable loop recorder removal  01/25/2020   MDT Reveal LINQ removed in office due to patient request due to anxiety of having an implanted device,  removed without complication  . NOSE SURGERY    . ROTATOR CUFF REPAIR    . TONSILLECTOMY  1970  . WISDOM TOOTH EXTRACTION      Current Outpatient Medications  Medication Sig Dispense Refill  . Ascorbic Acid (VITAMIN C PO) Take 1 tablet by mouth daily.    Marland Kitchen BIOTIN PO Take 1 tablet by mouth once a week.     . Cholecalciferol (DIALYVITE VITAMIN D 5000 PO) Take 1 tablet by mouth daily. Taking 2000 units daily    . Cyanocobalamin (VITAMIN B 12 PO) Take 1 tablet by mouth daily.    . Cyanocobalamin (VITAMIN B-12 PO) Take 1 tablet by mouth daily.    . Multiple Vitamin (MULTIVITAMIN) tablet Take 1 tablet by mouth daily.    . Omega 3 1000 MG CAPS Take  1 capsule by mouth daily.     Marland Kitchen Specialty Vitamins Products (WEIGHT LOSS DAILY MULTI PO) Take 1 tablet by mouth in the morning and at bedtime. [facility] pill- only taking for a short period of time    . Vitamin D, Ergocalciferol, (DRISDOL) 1.25 MG (50000 UNIT) CAPS capsule Take 1 capsule (50,000 Units total) by mouth every 7 (seven) days. 12 capsule 0   No current facility-administered medications for this encounter.    No Known Allergies  Social History   Socioeconomic History  . Marital status: Single    Spouse name: Not on file  . Number of children: Not on file  . Years of education: Not on file  . Highest education level: Not on file  Occupational History  . Not on file  Tobacco Use  . Smoking status: Former  Smoker    Years: 25.00    Types: Cigarettes  . Smokeless tobacco: Never Used  . Tobacco comment: smoked off and on for about 25 years  Vaping Use  . Vaping Use: Never used  Substance and Sexual Activity  . Alcohol use: Yes    Alcohol/week: 0.0 standard drinks    Comment: seldom  . Drug use: No  . Sexual activity: Yes    Partners: Male  Other Topics Concern  . Not on file  Social History Narrative   Marital status/children/pets: Single.    Education/employment: retired Librarian, academic:      -smoke alarm in the home:Yes     - wears seatbelt: Yes     - Feels safe in their relationships: Yes   Social Determinants of Health   Financial Resource Strain:   . Difficulty of Paying Living Expenses: Not on file  Food Insecurity:   . Worried About Charity fundraiser in the Last Year: Not on file  . Ran Out of Food in the Last Year: Not on file  Transportation Needs:   . Lack of Transportation (Medical): Not on file  . Lack of Transportation (Non-Medical): Not on file  Physical Activity:   . Days of Exercise per Week: Not on file  . Minutes of Exercise per Session: Not on file  Stress:   . Feeling of Stress : Not on file  Social Connections:   . Frequency of Communication with Friends and Family: Not on file  . Frequency of Social Gatherings with Friends and Family: Not on file  . Attends Religious Services: Not on file  . Active Member of Clubs or Organizations: Not on file  . Attends Archivist Meetings: Not on file  . Marital Status: Not on file  Intimate Partner Violence:   . Fear of Current or Ex-Partner: Not on file  . Emotionally Abused: Not on file  . Physically Abused: Not on file  . Sexually Abused: Not on file    Family History  Problem Relation Age of Onset  . Hearing loss Mother   . Macular degeneration Mother   . Alzheimer's disease Father   . Hypertension Father   . Heart disease Father   . Stomach cancer Maternal Grandmother    . Ovarian cancer Maternal Grandmother   . Emphysema Maternal Grandfather   . Colon cancer Maternal Uncle   . Lung cancer Sister   . Heart disease Brother   . Esophageal cancer Neg Hx   . Rectal cancer Neg Hx   . Breast cancer Neg Hx     ROS- All systems are reviewed  and negative except as per the HPI above  Physical Exam: Vitals:   07/15/20 1503  BP: 132/86  Pulse: (!) 120  Weight: 95.5 kg  Height: 5\' 6"  (1.676 m)   Wt Readings from Last 3 Encounters:  07/15/20 95.5 kg  06/06/20 93.5 kg  01/25/20 94.3 kg    Labs: Lab Results  Component Value Date   NA 141 10/07/2018   K 4.2 10/07/2018   CL 105 10/07/2018   CO2 25 10/07/2018   GLUCOSE 94 10/07/2018   BUN 24 (H) 10/07/2018   CREATININE 0.90 10/07/2018   CALCIUM 9.5 10/07/2018   No results found for: INR Lab Results  Component Value Date   CHOL 240 (H) 10/07/2018   HDL 51.10 10/07/2018   LDLCALC 159 (H) 10/07/2018   TRIG 151.0 (H) 10/07/2018     GEN- The patient is well appearing, alert and oriented x 3 today.   Head- normocephalic, atraumatic Eyes-  Sclera clear, conjunctiva pink Ears- hearing intact Oropharynx- clear Neck- supple, no JVP Lymph- no cervical lymphadenopathy Lungs- Clear to ausculation bilaterally, normal work of breathing Heart- irregular rate and rhythm, no murmurs, rubs or gallops, PMI not laterally displaced GI- soft, NT, ND, + BS Extremities- no clubbing, cyanosis, or edema MS- no significant deformity or atrophy Skin- no rash or lesion Psych- euthymic mood, full affect Neuro- strength and sensation are intact  EKG-afib at 120 bpm, qrs int 70 ms, qtc 457 ms  Epic records reviewed   Assessment and Plan:  1. Paroxysmal afib In afib with RVR today  Recommended to stop weight loss supplements that contain stimulants and can trigger afib   Currently chadsvasc score is 2 and by guidelines does not require anticoagulation, she has preferred to not be on anticoagulation  She  prefers not to start rate control  Explained to pt that if still in afib on Friday will need to start meds for rate control and most likely anticoagulation    Butch Penny C. Concettina Leth, [facility] [address] [place], [place] [address] [phone]

## 2020-07-15 NOTE — Telephone Encounter (Signed)
Patient  Calling about her Vitamin D. And how much she should be taking per day.  Patient aware because of time of call today after 4:40pm, she may not receive a follow up call until tomorrow 9/14.  She agreed and understood.  Please call [phone].

## 2020-07-15 NOTE — Patient Instructions (Signed)
Stop physician weight loss supplement.

## 2020-07-15 NOTE — Telephone Encounter (Signed)
Spoke with pt and verified that she is taking 2000 units per day with the once weekly high dose. Pt is due for lab visit on 10/26

## 2020-07-19 ENCOUNTER — Other Ambulatory Visit: Payer: Self-pay

## 2020-07-19 ENCOUNTER — Ambulatory Visit (HOSPITAL_COMMUNITY)
Admission: RE | Admit: 2020-07-19 | Discharge: 2020-07-19 | Disposition: A | Discharge status: Home / Self Care | Payer: Medicare HMO | Source: Ambulatory Visit | Attending: Nurse Practitioner | Admitting: Nurse Practitioner

## 2020-07-19 ENCOUNTER — Telehealth: Payer: Self-pay

## 2020-07-19 VITALS — BP 120/76 | HR 68

## 2020-07-19 DIAGNOSIS — I4891 Unspecified atrial fibrillation: Secondary | ICD-10-CM | POA: Diagnosis not present

## 2020-07-19 DIAGNOSIS — I480 Paroxysmal atrial fibrillation: Secondary | ICD-10-CM

## 2020-07-19 DIAGNOSIS — R5382 Chronic fatigue, unspecified: Secondary | ICD-10-CM | POA: Diagnosis not present

## 2020-07-19 DIAGNOSIS — N951 Menopausal and female climacteric states: Secondary | ICD-10-CM | POA: Diagnosis not present

## 2020-07-19 NOTE — Telephone Encounter (Addendum)
Submitted VOB, Monovisc, right knee. 

## 2020-07-19 NOTE — Progress Notes (Signed)
Pt in for EKG after being in afib with RVR on last visit.she was taking stimulants for  weight loss. She was advised to stop drug  and now is back in rhythm. Will see back in office in 6 months.

## 2020-07-23 DIAGNOSIS — R6882 Decreased libido: Secondary | ICD-10-CM | POA: Diagnosis not present

## 2020-07-23 DIAGNOSIS — Z6833 Body mass index (BMI) 33.0-33.9, adult: Secondary | ICD-10-CM | POA: Diagnosis not present

## 2020-07-23 DIAGNOSIS — N951 Menopausal and female climacteric states: Secondary | ICD-10-CM | POA: Diagnosis not present

## 2020-07-23 DIAGNOSIS — I482 Chronic atrial fibrillation, unspecified: Secondary | ICD-10-CM | POA: Diagnosis not present

## 2020-07-23 DIAGNOSIS — R232 Flushing: Secondary | ICD-10-CM | POA: Diagnosis not present

## 2020-07-30 ENCOUNTER — Telehealth: Payer: Self-pay

## 2020-07-30 NOTE — Telephone Encounter (Signed)
PA required Monovisc, right knee. PA submitted on [facility] online portal. PA currently Pending-[id number]

## 2020-08-06 ENCOUNTER — Telehealth: Payer: Self-pay

## 2020-08-06 NOTE — Telephone Encounter (Signed)
Talked with Thomas concerning more information needed for PA. Faxed previous office note to [phone].

## 2020-08-08 ENCOUNTER — Telehealth: Payer: Self-pay

## 2020-08-08 NOTE — Telephone Encounter (Signed)
Called and left patient a VM to call back to schedule an appointment with Dr. Ninfa Linden for gel injection.  Approved, Monovisc, bilateral knee. [place] Patient will be responsible for 20% OOP. Co-pay of $25.00 PA required PA Approval# [id number] Valid 07/30/2020- 01/27/2021

## 2020-08-20 ENCOUNTER — Other Ambulatory Visit: Payer: Self-pay | Admitting: Family Medicine

## 2020-08-27 ENCOUNTER — Other Ambulatory Visit: Payer: Self-pay

## 2020-08-27 ENCOUNTER — Ambulatory Visit (INDEPENDENT_AMBULATORY_CARE_PROVIDER_SITE_OTHER): Payer: Medicare HMO

## 2020-08-27 DIAGNOSIS — E559 Vitamin D deficiency, unspecified: Secondary | ICD-10-CM

## 2020-08-27 LAB — VITAMIN D 25 HYDROXY (VIT D DEFICIENCY, FRACTURES): VITD: 25.37 ng/mL — ABNORMAL LOW (ref 30.00–100.00)

## 2020-08-28 ENCOUNTER — Other Ambulatory Visit: Payer: Self-pay | Admitting: Family Medicine

## 2020-08-28 ENCOUNTER — Telehealth: Payer: Self-pay

## 2020-08-28 MED ORDER — VITAMIN D (ERGOCALCIFEROL) 1.25 MG (50000 UNIT) PO CAPS
50000.0000 [IU] | ORAL_CAPSULE | ORAL | 0 refills | Status: DC
Start: 2020-08-28 — End: 2020-11-26

## 2020-08-28 NOTE — Telephone Encounter (Signed)
Spoke with pt and rescheduled appt.

## 2020-08-28 NOTE — Telephone Encounter (Signed)
Please call patient [phone]

## 2020-09-02 ENCOUNTER — Encounter: Payer: Self-pay | Admitting: Orthopaedic Surgery

## 2020-09-02 ENCOUNTER — Ambulatory Visit: Payer: Medicare HMO | Admitting: Orthopaedic Surgery

## 2020-09-02 DIAGNOSIS — M170 Bilateral primary osteoarthritis of knee: Secondary | ICD-10-CM | POA: Diagnosis not present

## 2020-09-02 DIAGNOSIS — M2011 Hallux valgus (acquired), right foot: Secondary | ICD-10-CM

## 2020-09-02 DIAGNOSIS — M1711 Unilateral primary osteoarthritis, right knee: Secondary | ICD-10-CM

## 2020-09-02 DIAGNOSIS — M1712 Unilateral primary osteoarthritis, left knee: Secondary | ICD-10-CM | POA: Diagnosis not present

## 2020-09-02 DIAGNOSIS — M2012 Hallux valgus (acquired), left foot: Secondary | ICD-10-CM

## 2020-09-02 MED ORDER — HYALURONAN 88 MG/4ML IX SOSY
88.0000 mg | PREFILLED_SYRINGE | INTRA_ARTICULAR | Status: AC | PRN
  Administered 2020-09-02: 88 mg via INTRA_ARTICULAR

## 2020-09-02 NOTE — Addendum Note (Signed)
Addended by: Robyne Peers on: 09/02/2020 04:15 PM   Modules accepted: Orders

## 2020-09-02 NOTE — Progress Notes (Signed)
   Procedure Note  Patient: [name]             Date of Birth: [birth year]           MRN: [id number]             Visit Date: 09/02/2020 HPI: Mrs. [name] comes in today for Monovisc injections both knees.  She has had no new injury to either knee.  She is done well with Monovisc injections in the right knee in the past.  Physical exam: Bilateral knees no abnormal warmth erythema or effusion.  Good range of motion of both knees.  Procedures: Visit Diagnoses:  1. Unilateral primary osteoarthritis, right knee   2. Primary osteoarthritis of left knee     Large Joint Inj: bilateral knee on 09/02/2020 3:47 PM Indications: pain Details: 22 G 1.5 in needle, superolateral approach  Arthrogram: No  Medications (Right): 88 mg Hyaluronan 88 MG/4ML Medications (Left): 88 mg Hyaluronan 88 MG/4ML Outcome: tolerated well, no immediate complications Procedure, treatment alternatives, risks and benefits explained, specific risks discussed. Consent was given by the patient. Immediately prior to procedure a time out was called to verify the correct patient, procedure, equipment, support staff and site/side marked as required. Patient was prepped and draped in the usual sterile fashion.    Plan: She knows to wait at least 6 months between supplemental injections in the knees.  Questions were encouraged and answered.  She did ask for referral for her hallux valgus deformities bilateral feet we will make this referral for her.  She does have obvious bunions on exam today.  She states that the painful and cause problems with shoe wear and also calluses on her feet.

## 2020-09-04 ENCOUNTER — Telehealth: Payer: Self-pay | Admitting: Physician Assistant

## 2020-09-04 NOTE — Telephone Encounter (Signed)
Pt called stating she hasn't heard anything back from Dr.Hewit's office and she would like for [place] to check on her referral; pt would also like a CB with the number for his office   [phone]

## 2020-09-04 NOTE — Telephone Encounter (Signed)
LMOM for patient letting her know the referral has been sent but unfortunately they don't call with appt quite this quick

## 2020-09-06 ENCOUNTER — Encounter: Payer: Self-pay | Admitting: Family Medicine

## 2020-09-06 ENCOUNTER — Telehealth (INDEPENDENT_AMBULATORY_CARE_PROVIDER_SITE_OTHER): Payer: Medicare HMO | Admitting: Family Medicine

## 2020-09-06 VITALS — Temp 98.5°F

## 2020-09-06 DIAGNOSIS — R059 Cough, unspecified: Secondary | ICD-10-CM | POA: Diagnosis not present

## 2020-09-06 DIAGNOSIS — B9689 Other specified bacterial agents as the cause of diseases classified elsewhere: Secondary | ICD-10-CM

## 2020-09-06 DIAGNOSIS — J329 Chronic sinusitis, unspecified: Secondary | ICD-10-CM

## 2020-09-06 MED ORDER — HYDROCODONE-HOMATROPINE 5-1.5 MG PO TABS
ORAL_TABLET | ORAL | 0 refills | Status: DC
Start: 2020-09-06 — End: 2020-10-04

## 2020-09-06 MED ORDER — PREDNISONE 20 MG PO TABS: 40.0000 mg | ORAL_TABLET | Freq: Every day | ORAL | 0 refills | Status: DC

## 2020-09-06 MED ORDER — AMOXICILLIN-POT CLAVULANATE 875-125 MG PO TABS: 1.0000 | ORAL_TABLET | Freq: Two times a day (BID) | ORAL | 0 refills | Status: DC

## 2020-09-06 MED ORDER — BENZONATATE 200 MG PO CAPS: 200.0000 mg | ORAL_CAPSULE | Freq: Two times a day (BID) | ORAL | 0 refills | Status: DC | PRN

## 2020-09-06 NOTE — Progress Notes (Signed)
VIRTUAL VISIT VIA VIDEO  I connected with [name] on 09/06/20 at  4:00 PM EDT by elemedicine application and verified that I am speaking with the correct person using two identifiers. Location patient: Home Location provider: [facility], Office Persons participating in the virtual visit: Patient, Dr. Raoul Pitch and Samul Dada, CMA  I discussed the limitations of evaluation and management by telemedicine and the availability of in person appointments. The patient expressed understanding and agreed to proceed.   SUBJECTIVE Chief Complaint  Patient presents with  . Cough    Pt c/o sneezing, ear pain, headaches, nasal congestion, postnasal drip, rhinorrhea and a sore throat that onset 3 days ago. The problem has been rapidly worsening. She has tried OTC cough suppressant and Tylenol for the symptoms and provided mild relief.     [name] is 69 y.o. female present for cough, sneezing, ear pain, headaches, nasal congestion, sore throat  and PND of 3 days duration. She denies fever and chills.  Her daughter has been ill and treated with antibiotic-Covid negative. She has completed her covid pfizer vaccine series- 6 months ago.  ROS: See pertinent positives and negatives per HPI.  Patient Active Problem List   Diagnosis Date Noted  . Hot flashes 04/16/2020  . Gain of weight 04/16/2020  . Menopausal disorder 04/16/2020  . Palpitations 12/11/2019  . Vegetarian diet 10/04/2019  . Urinary incontinence 10/03/2019  . Pruritus 10/03/2019  . Mixed hyperlipidemia 10/09/2018  . Chronic knee pain 10/03/2018  . Welcome to Medicare preventive visit 10/03/2018  . Paroxysmal atrial fibrillation ([place]) 10/01/2017  . Vitamin D deficiency 03/01/2015  . Obesity (BMI 35.0-39.9 without comorbidity) 03/01/2015    Social History   Tobacco Use  . Smoking status: Former Smoker    Years: 25.00    Types: Cigarettes  . Smokeless tobacco: Never Used  . Tobacco comment: smoked off  and on for about 25 years  Substance Use Topics  . Alcohol use: Yes    Alcohol/week: 0.0 standard drinks    Comment: seldom    Current Outpatient Medications:  .  Ascorbic Acid (VITAMIN C PO), Take 1 tablet by mouth daily., Disp: , Rfl:  .  BIOTIN PO, Take 1 tablet by mouth once a week. , Disp: , Rfl:  .  Cyanocobalamin (VITAMIN B 12 PO), Take 1 tablet by mouth daily., Disp: , Rfl:  .  Multiple Vitamin (MULTIVITAMIN) tablet, Take 1 tablet by mouth daily., Disp: , Rfl:  .  Omega 3 1000 MG CAPS, Take 1 capsule by mouth daily. , Disp: , Rfl:  .  Probiotic Product (PROBIOTIC PO), Take 1 capsule by mouth daily., Disp: , Rfl:  .  sulfacetamide (BLEPH-10) 10 % ophthalmic solution, , Disp: , Rfl:  .  Vitamin D, Ergocalciferol, (DRISDOL) 1.25 MG (50000 UNIT) CAPS capsule, Take 1 capsule (50,000 Units total) by mouth every 7 (seven) days., Disp: 12 capsule, Rfl: 0 .  amoxicillin-clavulanate (AUGMENTIN) 875-125 MG tablet, Take 1 tablet by mouth 2 (two) times daily., Disp: 20 tablet, Rfl: 0 .  benzonatate (TESSALON) 200 MG capsule, Take 1 capsule (200 mg total) by mouth 2 (two) times daily as needed for cough., Disp: 30 capsule, Rfl: 0 .  HYDROcodone-Homatropine 5-1.5 MG TABS, 1 tab qhs, Disp: 10 tablet, Rfl: 0 .  predniSONE (DELTASONE) 20 MG tablet, Take 2 tablets (40 mg total) by mouth daily with breakfast., Disp: 6 tablet, Rfl: 0  No Known Allergies  OBJECTIVE: Temp 98.5 F (36.9 C) (  Oral)   LMP 04/28/2012  Gen: No acute distress. Nontoxic in appearance.  HENT: AT. [place].  MMM.  Eyes:Pupils Equal Round Reactive to light, Extraocular movements intact,  Conjunctiva without redness, discharge or icterus. Chest: Cough has not.  No shortness of breath Skin: no rashes, purpura or petechiae.  Neuro: Alert. Oriented x3   ASSESSMENT AND PLAN: [name] is a 69 y.o. female present for  Bacterial sinusitis Rest, hydrate.  +/- flonase, mucinex (DM if cough), nettie pot or nasal saline.    Augmentin and prednisone prescribed, take until completed.  Tessalon Perles Hycodan cough tablet If cough present it can last up to 6-8 weeks.  - covid testing recommended along with self isolation.  F/U 2 weeks of not improved.    Howard Pouch, DO 09/06/2020   No follow-ups on file.  No orders of the defined types were placed in this encounter.  Meds ordered this encounter  Medications  . benzonatate (TESSALON) 200 MG capsule    Sig: Take 1 capsule (200 mg total) by mouth 2 (two) times daily as needed for cough.    Dispense:  30 capsule    Refill:  0  . HYDROcodone-Homatropine 5-1.5 MG TABS    Sig: 1 tab qhs    Dispense:  10 tablet    Refill:  0  . amoxicillin-clavulanate (AUGMENTIN) 875-125 MG tablet    Sig: Take 1 tablet by mouth 2 (two) times daily.    Dispense:  20 tablet    Refill:  0  . predniSONE (DELTASONE) 20 MG tablet    Sig: Take 2 tablets (40 mg total) by mouth daily with breakfast.    Dispense:  6 tablet    Refill:  0   Referral Orders  No referral(s) requested today

## 2020-09-17 ENCOUNTER — Telehealth: Payer: Self-pay

## 2020-09-17 DIAGNOSIS — Z20822 Contact with and (suspected) exposure to covid-19: Secondary | ICD-10-CM | POA: Diagnosis not present

## 2020-09-17 NOTE — Telephone Encounter (Signed)
Patient called to make in office appt with Dr. Raoul Pitch. Patient states that she still has cough from October, had virtual appt with Dr. Raoul Pitch on 11/5.  Patient says she "does not feel good" and still has cough, would like for Dr. Raoul Pitch to listen to her chest. Patient reports she had COVID vaccine today at [facility] - pending results. Patient states she had Pfizer vaccine last year.   I told patient I would need approval from Dr. Raoul Pitch to schedule an in office appt.  Please advise.  Patient can be reached at [phone].

## 2020-09-18 NOTE — Telephone Encounter (Signed)
Pt was instructed the following instructions from Dr. Raoul Pitch last OV:  Rest, hydrate.  +/- flonase, mucinex (DM if cough), nettie pot or nasal saline.  Augmentin and prednisone prescribed, take until completed.  Tessalon Perles Hycodan cough tablet - covid testing recommended along with self isolation.  F/U 2 weeks of not improved.   Pt was is informed that cough could be present 6-8 weeks.

## 2020-09-25 DIAGNOSIS — D225 Melanocytic nevi of trunk: Secondary | ICD-10-CM | POA: Diagnosis not present

## 2020-09-25 DIAGNOSIS — D485 Neoplasm of uncertain behavior of skin: Secondary | ICD-10-CM | POA: Diagnosis not present

## 2020-09-25 DIAGNOSIS — C44519 Basal cell carcinoma of skin of other part of trunk: Secondary | ICD-10-CM | POA: Diagnosis not present

## 2020-09-25 DIAGNOSIS — L918 Other hypertrophic disorders of the skin: Secondary | ICD-10-CM | POA: Diagnosis not present

## 2020-09-25 DIAGNOSIS — L820 Inflamed seborrheic keratosis: Secondary | ICD-10-CM | POA: Diagnosis not present

## 2020-09-25 DIAGNOSIS — L821 Other seborrheic keratosis: Secondary | ICD-10-CM | POA: Diagnosis not present

## 2020-09-30 DIAGNOSIS — M2022 Hallux rigidus, left foot: Secondary | ICD-10-CM | POA: Diagnosis not present

## 2020-09-30 DIAGNOSIS — M2021 Hallux rigidus, right foot: Secondary | ICD-10-CM | POA: Diagnosis not present

## 2020-09-30 DIAGNOSIS — M2020 Hallux rigidus, unspecified foot: Secondary | ICD-10-CM | POA: Insufficient documentation

## 2020-10-01 ENCOUNTER — Telehealth: Payer: Self-pay | Admitting: *Deleted

## 2020-10-01 NOTE — Telephone Encounter (Signed)
   Primary Cardiologist: Thompson Grayer, MD  Chart reviewed as part of pre-operative protocol coverage. Patient was contacted 10/01/2020 in reference to pre-operative risk assessment for pending surgery as outlined below.  [name] was last seen on 07/15/20 by Roderic Palau, NP in atrial fib clinic, for atrial fib.  She has PAF.  Her CHA2DS2VASc is 2 and not on anticoagualtion.  She is not on medication to prevent a fib currently.   Since that day, [name] has done well and was back in SR on last EkG.  She is active and does aerobics 3 X per week without chest pain.  No known CAD.  Therefore, based on ACC/AHA guidelines, the patient would be at acceptable risk for the planned procedure without further cardiovascular testing.   The patient was advised that if she develops new symptoms prior to surgery to contact our office to arrange for a follow-up visit, and she verbalized understanding.  I will route this recommendation to the requesting party via Epic fax function and remove from pre-op pool. Please call with questions.  Cecilie Kicks, NP 10/01/2020, 1:25 PM

## 2020-10-01 NOTE — Telephone Encounter (Signed)
   [facility] Pre-operative Risk Assessment    HEARTCARE STAFF: - Please ensure there is not already an duplicate clearance open for this procedure. - Under Visit Info/Reason for Call, type in Other and utilize the format Clearance MM/DD/YY or Clearance TBD. Do not use dashes or single digits. - If request is for dental extraction, please clarify the # of teeth to be extracted.  Request for surgical clearance:  1. What type of surgery is being performed? RIGHT FOOT HALLUX MPJ ARTHRODESIS   2. When is this surgery scheduled? TBD   3. What type of clearance is required (medical clearance vs. Pharmacy clearance to hold med vs. Both)? MEDICAL  4. Are there any medications that need to be held prior to surgery and how long? NONE LISTED   5. Practice name and name of physician performing surgery? [facility]; DR. Jenny Reichmann [place]   6. What is the office phone number? [phone]   7.   What is the office fax number? [place]   Anesthesia type (None, local, MAC, general) ? MAC   Julaine Hua 10/01/2020, 9:15 AM  _________________________________________________________________   (provider comments below)

## 2020-10-02 ENCOUNTER — Telehealth: Payer: Self-pay

## 2020-10-02 NOTE — Telephone Encounter (Signed)
Patient reports she still has cough and phlegm even after 1 round of antibiotics that Dr. Raoul Pitch prescribed.  Please advise patient on next steps [phone]

## 2020-10-02 NOTE — Telephone Encounter (Signed)
Advised pt to try taking mucinex according to the boxed instructions to see if it helps with the cough and phlegm. Pt states that the phlegm does not have a color to it. I told pt to call if she does not see any improvement. She was concerned that she might need xray or something because of continued issue.

## 2020-10-03 NOTE — Telephone Encounter (Signed)
Please advise if appt okay. Pt was aware we would have to ask provider first for further instructions

## 2020-10-03 NOTE — Telephone Encounter (Signed)
Patient called back to request an in office appt with Dr. Raoul Pitch. Patient states she is wheezing, no SOB, but can hear herself wheezing. Coughing up yellow tinged phlegm.  No fever. Has had 2 negative COVID tests since 11/5. She started symptoms in late October. She is requesting to come in to have provider listen to her lungs and send her to get a chest xray, if needed.  I have scheduled her an appt for tomorrow 12/3 at 11:00AM with Dr. Raoul Pitch.  As of now, it is an office visit, if not approved for in office visit, we can change to virtual appt.  Patient is already aware of needing approval for in office appt.  Please advise. I have sent high priority encounter so it will be flagged, and does not fall off the "radar" to get approval from Dr. Raoul Pitch.  Thank you! Hinton Dyer

## 2020-10-03 NOTE — Telephone Encounter (Signed)
FYI

## 2020-10-03 NOTE — Telephone Encounter (Signed)
In office visit is fine

## 2020-10-04 ENCOUNTER — Ambulatory Visit (INDEPENDENT_AMBULATORY_CARE_PROVIDER_SITE_OTHER): Payer: Medicare HMO | Admitting: Family Medicine

## 2020-10-04 ENCOUNTER — Encounter: Payer: Self-pay | Admitting: Family Medicine

## 2020-10-04 ENCOUNTER — Ambulatory Visit (HOSPITAL_BASED_OUTPATIENT_CLINIC_OR_DEPARTMENT_OTHER)
Admission: RE | Admit: 2020-10-04 | Discharge: 2020-10-04 | Disposition: A | Discharge status: Home / Self Care | Payer: Medicare HMO | Source: Ambulatory Visit | Attending: Family Medicine | Admitting: Family Medicine

## 2020-10-04 ENCOUNTER — Other Ambulatory Visit: Payer: Self-pay

## 2020-10-04 VITALS — BP 113/70 | HR 76 | Temp 98.9°F | Ht 66.0 in | Wt 209.0 lb

## 2020-10-04 DIAGNOSIS — R059 Cough, unspecified: Secondary | ICD-10-CM | POA: Diagnosis not present

## 2020-10-04 DIAGNOSIS — J209 Acute bronchitis, unspecified: Secondary | ICD-10-CM | POA: Diagnosis not present

## 2020-10-04 MED ORDER — PREDNISONE 50 MG PO TABS: 50.0000 mg | ORAL_TABLET | Freq: Every day | ORAL | 0 refills | Status: DC

## 2020-10-04 MED ORDER — AZITHROMYCIN 250 MG PO TABS: ORAL_TABLET | ORAL | 0 refills | Status: DC

## 2020-10-04 MED ORDER — FLUTICASONE PROPIONATE 50 MCG/ACT NA SUSP: 2.0000 | Freq: Every day | NASAL | 6 refills | Status: DC

## 2020-10-04 NOTE — Progress Notes (Signed)
SUBJECTIVE Chief Complaint  Patient presents with  . Wheezing    pt c/o wheezing, productive cough with yellowish tint, headache, chest congestion; 2 neg covid test     LKT:GYBWLS [name] is 69 y.o. female present for follow up on URI. Patient was seen 09/06/2020 for cough, sneezing, ear pain, headaches, nasal congestion, sore throat  and PND of 3 days duration at that time .  She has completed her covid pfizer vaccine series- 6 months ago. She has had two covid tests this illness that were negative.  She has completed a course of Augmentin and prednisone. She was provided with tessalon perles and hycodan. She returns to day and reports she is wheezing and has a productive cough that is yellow. She has chest congestion. She denies fever or chills. Prescribed cough suppressants were not helpful.  She stopped her abx early bc she though she was getting better.   ROS: See pertinent positives and negatives per HPI.   Patient Active Problem List   Diagnosis Date Noted  . Hallux rigidus 09/30/2020  . Hot flashes 04/16/2020  . Gain of weight 04/16/2020  . Menopausal disorder 04/16/2020  . Palpitations 12/11/2019  . Vegetarian diet 10/04/2019  . Urinary incontinence 10/03/2019  . Pruritus 10/03/2019  . Mixed hyperlipidemia 10/09/2018  . Chronic knee pain 10/03/2018  . Welcome to Medicare preventive visit 10/03/2018  . Paroxysmal atrial fibrillation ([place]) 10/01/2017  . Vitamin D deficiency 03/01/2015  . Obesity (BMI 35.0-39.9 without comorbidity) 03/01/2015    Social History   Tobacco Use  . Smoking status: Former Smoker    Years: 25.00    Types: Cigarettes  . Smokeless tobacco: Never Used  . Tobacco comment: smoked off and on for about 25 years  Substance Use Topics  . Alcohol use: Yes    Alcohol/week: 0.0 standard drinks    Comment: seldom    Current Outpatient Medications:  .  Ascorbic Acid (VITAMIN C PO), Take 1 tablet by mouth daily., Disp: , Rfl:  .   Cyanocobalamin (VITAMIN B 12 PO), Take 1 tablet by mouth daily., Disp: , Rfl:  .  Multiple Vitamin (MULTIVITAMIN) tablet, Take 1 tablet by mouth daily., Disp: , Rfl:  .  Omega 3 1000 MG CAPS, Take 1 capsule by mouth daily. , Disp: , Rfl:  .  sulfacetamide (BLEPH-10) 10 % ophthalmic solution, , Disp: , Rfl:  .  Vitamin D, Ergocalciferol, (DRISDOL) 1.25 MG (50000 UNIT) CAPS capsule, Take 1 capsule (50,000 Units total) by mouth every 7 (seven) days., Disp: 12 capsule, Rfl: 0 .  azithromycin (ZITHROMAX) 250 MG tablet, 2 tabs day 1, then 1 tab qd, Disp: 6 tablet, Rfl: 0 .  fluticasone (FLONASE) 50 MCG/ACT nasal spray, Place 2 sprays into both nostrils daily., Disp: 16 g, Rfl: 6 .  predniSONE (DELTASONE) 50 MG tablet, Take 1 tablet (50 mg total) by mouth daily with breakfast., Disp: 5 tablet, Rfl: 0  No Known Allergies  OBJECTIVE: BP 113/70   Pulse 76   Temp 98.9 F (37.2 C) (Oral)   Ht 5\' 6"  (1.676 m)   Wt 209 lb (94.8 kg)   LMP 04/28/2012   SpO2 98%   BMI 33.73 kg/m  Gen: Afebrile. No acute distress. Nontoxic.  HENT: AT. [place]. Bilateral TM visualized and normal in appearance. MMM. Bilateral nares with erythema and drainage. Throat without erythema or exudates. Cough present. Hoarseness present. No shortness of breath Eyes:Pupils Equal Round Reactive to light, Extraocular movements intact,  Conjunctiva without redness,  discharge or icterus. Neck/lymp/endocrine: Supple,no lymphadenopathy CV: RRR  Chest: CTAB, no wheeze or crackles- cough with deep inspiration.  Skin: no rashes, purpura or petechiae.  Neuro:  Normal gait. PERLA. EOMi. Alert. Oriented x3.   ASSESSMENT AND PLAN: [name] is a 69 y.o. female present for  Bacterial sinusitis Rest, hydrate.  Continue  flonase, mucinex (DM if cough), nettie pot or nasal saline.  z-pack and prednisone prescribed, take until completed.  pick up OTC cough syrup.  Start OTC antihistamine daily.  cxr ordered.  - covid testing has been  negative twice during the course of her illness.  F/u PRN   Howard Pouch, DO 10/04/2020   Return if symptoms worsen or fail to improve.  Orders Placed This Encounter  Procedures  . DG Chest 2 View   Meds ordered this encounter  Medications  . azithromycin (ZITHROMAX) 250 MG tablet    Sig: 2 tabs day 1, then 1 tab qd    Dispense:  6 tablet    Refill:  0  . predniSONE (DELTASONE) 50 MG tablet    Sig: Take 1 tablet (50 mg total) by mouth daily with breakfast.    Dispense:  5 tablet    Refill:  0  . fluticasone (FLONASE) 50 MCG/ACT nasal spray    Sig: Place 2 sprays into both nostrils daily.    Dispense:  16 g    Refill:  6   Referral Orders  No referral(s) requested today

## 2020-10-04 NOTE — Patient Instructions (Signed)
Rest. Hydrate Start antihistamine before bed Start azithromycin pack.  Start prednisone 1 tab for 5 days.   Pick up cough syrup   We will call you with chest xray result    Acute Bronchitis, Adult  Acute bronchitis is when air tubes in the lungs (bronchi) suddenly get swollen. The condition can make it hard for you to breathe. In adults, acute bronchitis usually goes away within 2 weeks. A cough caused by bronchitis may last up to 3 weeks. Smoking, allergies, and asthma can make the condition worse. What are the causes? This condition is caused by:  Cold and flu viruses. The most common cause of this condition is the virus that causes the common cold.  Bacteria.  Substances that irritate the lungs, including: ? Smoke from cigarettes and other types of tobacco. ? Dust and pollen. ? Fumes from chemicals, gases, or burned fuel. ? Other materials that pollute indoor or outdoor air.  Close contact with someone who has acute bronchitis. What increases the risk? The following factors may make you more likely to develop this condition:  A weak body's defense system. This is also called the immune system.  Any condition that affects your lungs and breathing, such as asthma. What are the signs or symptoms? Symptoms of this condition include:  A cough.  Coughing up clear, yellow, or green mucus.  Wheezing.  Chest congestion.  Shortness of breath.  A fever.  Body aches.  Chills.  A sore throat. How is this treated? Acute bronchitis may go away over time without treatment. Your doctor may recommend:  Drinking more fluids.  Taking a medicine for a fever or cough.  Using a device that gets medicine into your lungs (inhaler).  Using a vaporizer or a humidifier. These are machines that add water or moisture in the air to help with coughing and poor breathing. Follow these instructions at home:  Activity  Get a lot of rest.  Avoid places where there are fumes  from chemicals.  Return to your normal activities as told by your doctor. Ask your doctor what activities are safe for you. Lifestyle  Drink enough fluids to keep your pee (urine) pale yellow.  Do not drink alcohol.  Do not use any products that contain nicotine or tobacco, such as cigarettes, e-cigarettes, and chewing tobacco. If you need help quitting, ask your doctor. Be aware that: ? Your bronchitis will get worse if you smoke or breathe in other people's smoke (secondhand smoke). ? Your lungs will heal faster if you quit smoking. General instructions  Take over-the-counter and prescription medicines only as told by your doctor.  Use an inhaler, cool mist vaporizer, or humidifier as told by your doctor.  Rinse your mouth often with salt water. To make salt water, dissolve -1 tsp (3-6 g) of salt in 1 cup (237 mL) of warm water.  Keep all follow-up visits as told by your doctor. This is important. How is this prevented? To lower your risk of getting this condition again:  Wash your hands often with soap and water. If soap and water are not available, use hand sanitizer.  Avoid contact with people who have cold symptoms.  Try not to touch your mouth, nose, or eyes with your hands.  Make sure to get the flu shot every year. Contact a doctor if:  Your symptoms do not get better in 2 weeks.  You vomit more than once or twice.  You have symptoms of loss of fluid from your  body (dehydration). These include: ? Dark urine. ? Dry skin or eyes. ? Increased thirst. ? Headaches. ? Confusion. ? Muscle cramps. Get help right away if:  You cough up blood.  You have chest pain.  You have very bad shortness of breath.  You become dehydrated.  You faint or keep feeling like you are going to faint.  You keep vomiting.  You have a very bad headache.  Your fever or chills get worse. These symptoms may be an emergency. Do not wait to see if the symptoms will go away. Get  medical help right away. Call your local emergency services (911 in the [place] Do not drive yourself to the hospital. Summary  Acute bronchitis is when air tubes in the lungs (bronchi) suddenly get swollen. In adults, acute bronchitis usually goes away within 2 weeks.  Take over-the-counter and prescription medicines only as told by your doctor.  Drink enough fluid to keep your pee (urine) pale yellow.  Contact a doctor if your symptoms do not improve after 2 weeks of treatment.  Get help right away if you cough up blood, faint, or have chest pain or shortness of breath. This information is not intended to replace advice given to you by your health care provider. Make sure you discuss any questions you have with your health care provider. Document Revised: 05/12/2019 Document Reviewed: 05/12/2019 Elsevier Patient Education  [place].

## 2020-10-09 DIAGNOSIS — H11823 Conjunctivochalasis, bilateral: Secondary | ICD-10-CM | POA: Diagnosis not present

## 2020-10-09 DIAGNOSIS — H25013 Cortical age-related cataract, bilateral: Secondary | ICD-10-CM | POA: Diagnosis not present

## 2020-10-09 DIAGNOSIS — H524 Presbyopia: Secondary | ICD-10-CM | POA: Diagnosis not present

## 2020-10-09 DIAGNOSIS — H04123 Dry eye syndrome of bilateral lacrimal glands: Secondary | ICD-10-CM | POA: Diagnosis not present

## 2020-10-09 DIAGNOSIS — H2511 Age-related nuclear cataract, right eye: Secondary | ICD-10-CM | POA: Diagnosis not present

## 2020-10-10 ENCOUNTER — Telehealth: Payer: Self-pay

## 2020-10-10 NOTE — Telephone Encounter (Signed)
Not any better after 2 rounds of antibotics.  What next? Told her Dr. Raoul Pitch had already left for the day.  I offered her an in office appt for tomorrow 12/10.  She declined.  Please call her tomorrow 12/10 because of time of call today. [phone]

## 2020-10-11 NOTE — Telephone Encounter (Signed)
Spoke with pt and pt states she still have a mild cough. Pt was inform to continue OTC tx as planned in last OV. Pt was reminded that the cough can be present for for 6-8 weeks. Pt declined appt but express concerns of cough. Pt was informed to follow above instructions and to CB if sx worsen.

## 2020-10-11 NOTE — Telephone Encounter (Signed)
Agree w/ instructions.  Her cxr was clear. Cough can remain for 4-6 weeks after bronchitis.

## 2020-10-28 DIAGNOSIS — N951 Menopausal and female climacteric states: Secondary | ICD-10-CM | POA: Diagnosis not present

## 2020-10-30 DIAGNOSIS — I482 Chronic atrial fibrillation, unspecified: Secondary | ICD-10-CM | POA: Diagnosis not present

## 2020-10-30 DIAGNOSIS — Z6835 Body mass index (BMI) 35.0-35.9, adult: Secondary | ICD-10-CM | POA: Diagnosis not present

## 2020-10-30 DIAGNOSIS — R6882 Decreased libido: Secondary | ICD-10-CM | POA: Diagnosis not present

## 2020-10-30 DIAGNOSIS — N951 Menopausal and female climacteric states: Secondary | ICD-10-CM | POA: Diagnosis not present

## 2020-10-30 DIAGNOSIS — R232 Flushing: Secondary | ICD-10-CM | POA: Diagnosis not present

## 2020-10-31 ENCOUNTER — Telehealth: Payer: Self-pay

## 2020-10-31 DIAGNOSIS — Z20822 Contact with and (suspected) exposure to covid-19: Secondary | ICD-10-CM | POA: Diagnosis not present

## 2020-10-31 DIAGNOSIS — B9689 Other specified bacterial agents as the cause of diseases classified elsewhere: Secondary | ICD-10-CM | POA: Diagnosis not present

## 2020-10-31 DIAGNOSIS — J208 Acute bronchitis due to other specified organisms: Secondary | ICD-10-CM | POA: Diagnosis not present

## 2020-10-31 NOTE — Telephone Encounter (Signed)
Surgical clearance forms received on 10/31/20. Patient has been scheduled on 11/08/20 to surgical clearance appt. Forms have been placed in PCP office.

## 2020-11-02 ENCOUNTER — Encounter: Payer: Self-pay | Admitting: Family Medicine

## 2020-11-05 ENCOUNTER — Telehealth: Payer: Self-pay | Admitting: *Deleted

## 2020-11-05 NOTE — Telephone Encounter (Signed)
   [facility] Pre-operative Risk Assessment    HEARTCARE STAFF: - Please ensure there is not already an duplicate clearance open for this procedure. - Under Visit Info/Reason for Call, type in Other and utilize the format Clearance MM/DD/YY or Clearance TBD. Do not use dashes or single digits. - If request is for dental extraction, please clarify the # of teeth to be extracted.  Request for surgical clearance:  1. What type of surgery is being performed? BODY LIPOSUCTION   2. When is this surgery scheduled? TBD   3. What type of clearance is required (medical clearance vs. Pharmacy clearance to hold med vs. Both)? MEDICAL  4. Are there any medications that need to be held prior to surgery and how long? NONE LISTED   5. Practice name and name of physician performing surgery? [facility]; DR. Lilia Pro   6. What is the office phone number? [phone]   7.   What is the office fax number? [phone]  8.   Anesthesia type (None, local, MAC, general) ? LOCAL LIDOCAINE   [name] 11/05/2020, 12:48 PM  _________________________________________________________________   (provider comments below)

## 2020-11-05 NOTE — Telephone Encounter (Signed)
   Primary Cardiologist: Hillis Range, MD  Chart reviewed as part of pre-operative protocol coverage. Given past medical history and time since last visit, based on ACC/AHA guidelines, [name] would be at acceptable risk for the planned procedure without further cardiovascular testing. She recently underwent foot surgery and was cleared by our team 11/30/2. Surgery was perfremd without complication.    The patient was advised that if she develops new symptoms prior to surgery to contact our office to arrange for a follow-up visit, and she verbalized understanding.  I will route this recommendation to the requesting party via Epic fax function and remove from pre-op pool.  Please call with questions.  Georgie Chard, NP 11/05/2020, 1:09 PM

## 2020-11-08 ENCOUNTER — Ambulatory Visit: Payer: Medicare HMO | Admitting: Family Medicine

## 2020-11-15 ENCOUNTER — Encounter: Payer: Self-pay | Admitting: Family Medicine

## 2020-11-15 ENCOUNTER — Ambulatory Visit (INDEPENDENT_AMBULATORY_CARE_PROVIDER_SITE_OTHER): Payer: Medicare HMO | Admitting: Family Medicine

## 2020-11-15 ENCOUNTER — Other Ambulatory Visit: Payer: Self-pay

## 2020-11-15 VITALS — BP 125/81 | HR 81 | Temp 98.2°F | Ht 66.0 in | Wt 232.0 lb

## 2020-11-15 DIAGNOSIS — Z01818 Encounter for other preprocedural examination: Secondary | ICD-10-CM | POA: Diagnosis not present

## 2020-11-15 DIAGNOSIS — I480 Paroxysmal atrial fibrillation: Secondary | ICD-10-CM

## 2020-11-15 DIAGNOSIS — E669 Obesity, unspecified: Secondary | ICD-10-CM | POA: Diagnosis not present

## 2020-11-15 DIAGNOSIS — E559 Vitamin D deficiency, unspecified: Secondary | ICD-10-CM

## 2020-11-15 DIAGNOSIS — Z131 Encounter for screening for diabetes mellitus: Secondary | ICD-10-CM | POA: Diagnosis not present

## 2020-11-15 DIAGNOSIS — Z130 Encounter for screening for diseases of the blood and blood-forming organs and certain disorders involving the immune mechanism: Secondary | ICD-10-CM | POA: Diagnosis not present

## 2020-11-15 NOTE — Progress Notes (Signed)
   This visit occurred during the SARS-CoV-2 public health emergency.  Safety protocols were in place, including screening questions prior to the visit, additional usage of staff PPE, and extensive cleaning of exam room while observing appropriate contact time as indicated for disinfecting solutions.    [name] , 08/31/1951, 69 y.o., female MRN: [id number] Patient Care Team    Relationship Specialty Notifications Start End  ,  A, DO PCP - General Family Medicine  10/03/19   Allred, James, MD PCP - Cardiology Cardiology  10/01/20   Allred, James, MD Consulting Physician Cardiology  10/04/19   Groat, Christopher, MD Consulting Physician Ophthalmology  10/04/19     Chief Complaint  Patient presents with  . Follow-up    Surgical clearance      Subjective: Pt presents for an OV  For surgical clearance.  Procedure: Liposuction and superficial excision of excess skin Anesthesia: oral only.  Surgery type risk:   - LOW- liposuction Prior anesthesia complications:pt denies Family history of prior anesthesia complications:pt denies Cardiac:    - CBD, PAD, stroke, MI, aortic stenosis > N/A   - METs: > 4 METS   - EKG: none Pulmonary: pt denies.  Endocrine: no known endocrine disorder- Labs collected today Obesity:Body mass index is 37.45 kg/m. Chronic kidney disease:No known CKD> lab collected today Chronic med that needs to be continued:n/a Anticoagulation: She is not on chronic anticoagulation  Depression screen PHQ 2/9 10/04/2020 12/27/2019 10/03/2019 10/03/2018  Decreased Interest 0 0 0 0  Down, Depressed, Hopeless 0 0 0 0  PHQ - 2 Score 0 0 0 0  Altered sleeping - 1 - -  Tired, decreased energy - 0 - -  Change in appetite - 1 - -  Feeling bad or failure about yourself  - 1 - -  Trouble concentrating - 0 - -  Moving slowly or fidgety/restless - 0 - -  Suicidal thoughts - 0 - -  PHQ-9 Score - 3 - -    No Known Allergies Social History   Social History  Narrative   Marital status/children/pets: Single.    Education/employment: retired administrative assistant   Safety:      -smoke alarm in the home:Yes     - wears seatbelt: Yes     - Feels safe in their relationships: Yes   Past Medical History:  Diagnosis Date  . Acute conjunctivitis of both eyes 04/05/2019  . Allergy   . Anxiety   . Arthritis   . Chicken pox   . Colon polyp   . Essential hypertension 10/03/2018   patient denies diagnosis of afib  . Fibroids    Uterine  . GERD (gastroesophageal reflux disease)   . Heart murmur   . History of colon polyps   . Hyperlipidemia   . Overweight    Past Surgical History:  Procedure Laterality Date  . implantable loop recorder placement  12/11/2019    Medtronic Reveal Linq model LNQ 2 ([id number]) implantable loop recorder  . implantable loop recorder removal  01/25/2020   MDT Reveal LINQ removed in office due to patient request due to anxiety of having an implanted device,  removed without complication  . NOSE SURGERY    . ROTATOR CUFF REPAIR    . TONSILLECTOMY  1970  . WISDOM TOOTH EXTRACTION     Family History  Problem Relation Age of Onset  . Hearing loss Mother   . Macular degeneration Mother   . Alzheimer's disease Father   .      This visit occurred during the SARS-CoV-2 public health emergency.  Safety protocols were in place, including screening questions prior to the visit, additional usage of staff PPE, and extensive cleaning of exam room while observing appropriate contact time as indicated for disinfecting solutions.    [name] , 08/31/1951, 69 y.o., female MRN: [id number] Patient Care Team    Relationship Specialty Notifications Start End  ,  A, DO PCP - General Family Medicine  10/03/19   Allred, James, MD PCP - Cardiology Cardiology  10/01/20   Allred, James, MD Consulting Physician Cardiology  10/04/19   Groat, Christopher, MD Consulting Physician Ophthalmology  10/04/19     Chief Complaint  Patient presents with  . Follow-up    Surgical clearance      Subjective: Pt presents for an OV  For surgical clearance.  Procedure: Liposuction and superficial excision of excess skin Anesthesia: oral only.  Surgery type risk:   - LOW- liposuction Prior anesthesia complications:pt denies Family history of prior anesthesia complications:pt denies Cardiac:    - CBD, PAD, stroke, MI, aortic stenosis > N/A   - METs: > 4 METS   - EKG: none Pulmonary: pt denies.  Endocrine: no known endocrine disorder- Labs collected today Obesity:Body mass index is 37.45 kg/m. Chronic kidney disease:No known CKD> lab collected today Chronic med that needs to be continued:n/a Anticoagulation: She is not on chronic anticoagulation  Depression screen PHQ 2/9 10/04/2020 12/27/2019 10/03/2019 10/03/2018  Decreased Interest 0 0 0 0  Down, Depressed, Hopeless 0 0 0 0  PHQ - 2 Score 0 0 0 0  Altered sleeping - 1 - -  Tired, decreased energy - 0 - -  Change in appetite - 1 - -  Feeling bad or failure about yourself  - 1 - -  Trouble concentrating - 0 - -  Moving slowly or fidgety/restless - 0 - -  Suicidal thoughts - 0 - -  PHQ-9 Score - 3 - -    No Known Allergies Social History   Social History  Narrative   Marital status/children/pets: Single.    Education/employment: retired administrative assistant   Safety:      -smoke alarm in the home:Yes     - wears seatbelt: Yes     - Feels safe in their relationships: Yes   Past Medical History:  Diagnosis Date  . Acute conjunctivitis of both eyes 04/05/2019  . Allergy   . Anxiety   . Arthritis   . Chicken pox   . Colon polyp   . Essential hypertension 10/03/2018   patient denies diagnosis of afib  . Fibroids    Uterine  . GERD (gastroesophageal reflux disease)   . Heart murmur   . History of colon polyps   . Hyperlipidemia   . Overweight    Past Surgical History:  Procedure Laterality Date  . implantable loop recorder placement  12/11/2019    Medtronic Reveal Linq model LNQ 2 ([id number]) implantable loop recorder  . implantable loop recorder removal  01/25/2020   MDT Reveal LINQ removed in office due to patient request due to anxiety of having an implanted device,  removed without complication  . NOSE SURGERY    . ROTATOR CUFF REPAIR    . TONSILLECTOMY  1970  . WISDOM TOOTH EXTRACTION     Family History  Problem Relation Age of Onset  . Hearing loss Mother   . Macular degeneration Mother   . Alzheimer's disease Father   .      This visit occurred during the SARS-CoV-2 public health emergency.  Safety protocols were in place, including screening questions prior to the visit, additional usage of staff PPE, and extensive cleaning of exam room while observing appropriate contact time as indicated for disinfecting solutions.    [name] , 08/31/1951, 69 y.o., female MRN: [id number] Patient Care Team    Relationship Specialty Notifications Start End  ,  A, DO PCP - General Family Medicine  10/03/19   Allred, James, MD PCP - Cardiology Cardiology  10/01/20   Allred, James, MD Consulting Physician Cardiology  10/04/19   Groat, Christopher, MD Consulting Physician Ophthalmology  10/04/19     Chief Complaint  Patient presents with  . Follow-up    Surgical clearance      Subjective: Pt presents for an OV  For surgical clearance.  Procedure: Liposuction and superficial excision of excess skin Anesthesia: oral only.  Surgery type risk:   - LOW- liposuction Prior anesthesia complications:pt denies Family history of prior anesthesia complications:pt denies Cardiac:    - CBD, PAD, stroke, MI, aortic stenosis > N/A   - METs: > 4 METS   - EKG: none Pulmonary: pt denies.  Endocrine: no known endocrine disorder- Labs collected today Obesity:Body mass index is 37.45 kg/m. Chronic kidney disease:No known CKD> lab collected today Chronic med that needs to be continued:n/a Anticoagulation: She is not on chronic anticoagulation  Depression screen PHQ 2/9 10/04/2020 12/27/2019 10/03/2019 10/03/2018  Decreased Interest 0 0 0 0  Down, Depressed, Hopeless 0 0 0 0  PHQ - 2 Score 0 0 0 0  Altered sleeping - 1 - -  Tired, decreased energy - 0 - -  Change in appetite - 1 - -  Feeling bad or failure about yourself  - 1 - -  Trouble concentrating - 0 - -  Moving slowly or fidgety/restless - 0 - -  Suicidal thoughts - 0 - -  PHQ-9 Score - 3 - -    No Known Allergies Social History   Social History  Narrative   Marital status/children/pets: Single.    Education/employment: retired administrative assistant   Safety:      -smoke alarm in the home:Yes     - wears seatbelt: Yes     - Feels safe in their relationships: Yes   Past Medical History:  Diagnosis Date  . Acute conjunctivitis of both eyes 04/05/2019  . Allergy   . Anxiety   . Arthritis   . Chicken pox   . Colon polyp   . Essential hypertension 10/03/2018   patient denies diagnosis of afib  . Fibroids    Uterine  . GERD (gastroesophageal reflux disease)   . Heart murmur   . History of colon polyps   . Hyperlipidemia   . Overweight    Past Surgical History:  Procedure Laterality Date  . implantable loop recorder placement  12/11/2019    Medtronic Reveal Linq model LNQ 2 ([id number]) implantable loop recorder  . implantable loop recorder removal  01/25/2020   MDT Reveal LINQ removed in office due to patient request due to anxiety of having an implanted device,  removed without complication  . NOSE SURGERY    . ROTATOR CUFF REPAIR    . TONSILLECTOMY  1970  . WISDOM TOOTH EXTRACTION     Family History  Problem Relation Age of Onset  . Hearing loss Mother   . Macular degeneration Mother   . Alzheimer's disease Father   .

## 2020-11-15 NOTE — Patient Instructions (Addendum)
Nice to see you today.  Once we get your labs back - we will fax over your surgical clearance forms.    Good luck on your surgery!

## 2020-11-16 LAB — COMPREHENSIVE METABOLIC PANEL
AG Ratio: 1.3 (calc) (ref 1.0–2.5)
ALT: 15 U/L (ref 6–29)
AST: 15 U/L (ref 10–35)
Albumin: 3.5 g/dL — ABNORMAL LOW (ref 3.6–5.1)
Alkaline phosphatase (APISO): 72 U/L (ref 37–153)
BUN: 12 mg/dL (ref 7–25)
CO2: 21 mmol/L (ref 20–32)
Calcium: 8.7 mg/dL (ref 8.6–10.4)
Chloride: 109 mmol/L (ref 98–110)
Creat: 0.81 mg/dL (ref 0.50–0.99)
Globulin: 2.6 g/dL (calc) (ref 1.9–3.7)
Glucose, Bld: 95 mg/dL (ref 65–99)
Potassium: 3.9 mmol/L (ref 3.5–5.3)
Sodium: 146 mmol/L (ref 135–146)
Total Bilirubin: 0.3 mg/dL (ref 0.2–1.2)
Total Protein: 6.1 g/dL (ref 6.1–8.1)

## 2020-11-16 LAB — CBC
HCT: 37.3 % (ref 35.0–45.0)
Hemoglobin: 12.7 g/dL (ref 11.7–15.5)
MCH: 30.2 pg (ref 27.0–33.0)
MCHC: 34 g/dL (ref 32.0–36.0)
MCV: 88.6 fL (ref 80.0–100.0)
MPV: 10.3 fL (ref 7.5–12.5)
Platelets: 303 10*3/uL (ref 140–400)
RBC: 4.21 10*6/uL (ref 3.80–5.10)
RDW: 12.3 % (ref 11.0–15.0)
WBC: 10.3 10*3/uL (ref 3.8–10.8)

## 2020-11-16 LAB — VITAMIN D 25 HYDROXY (VIT D DEFICIENCY, FRACTURES): Vit D, 25-Hydroxy: 36 ng/mL (ref 30–100)

## 2020-11-16 LAB — HEMOGLOBIN A1C
Hgb A1c MFr Bld: 5.6 % of total Hgb (ref <5.7)
Mean Plasma Glucose: 114 mg/dL
eAG (mmol/L): 6.3 mmol/L

## 2020-11-19 ENCOUNTER — Ambulatory Visit: Payer: Medicare HMO | Admitting: Family Medicine

## 2020-11-19 DIAGNOSIS — M2021 Hallux rigidus, right foot: Secondary | ICD-10-CM | POA: Diagnosis not present

## 2020-11-19 DIAGNOSIS — G8918 Other acute postprocedural pain: Secondary | ICD-10-CM | POA: Diagnosis not present

## 2020-11-19 NOTE — Telephone Encounter (Signed)
[facility] called to check status on these forms. Patient was seen 11/15/20. Please fax forms to [phone].

## 2020-11-20 ENCOUNTER — Telehealth: Payer: Self-pay | Admitting: Family Medicine

## 2020-11-20 NOTE — Telephone Encounter (Signed)
Form faxed. See other encounter

## 2020-11-20 NOTE — Telephone Encounter (Signed)
Completed surgical clearance to [facility] as requested.  Please fax clearance and let patient know it has been completed.

## 2020-11-20 NOTE — Telephone Encounter (Signed)
Forms faxed

## 2020-11-21 ENCOUNTER — Telehealth: Payer: Self-pay

## 2020-11-21 NOTE — Telephone Encounter (Signed)
Patient called our office stating she had surgery with Dr Doran Durand a few days ago and that she is experiencing a lot pain since surgery-I advised her that she would need to reach out to Dr Doran Durand to discuss further needs for pain medication since Dr Ninfa Linden did not perform her surgery.

## 2020-11-22 ENCOUNTER — Ambulatory Visit: Payer: Medicare HMO | Admitting: Family Medicine

## 2020-11-26 ENCOUNTER — Other Ambulatory Visit: Payer: Self-pay | Admitting: Family Medicine

## 2020-11-27 ENCOUNTER — Encounter: Payer: Medicare HMO | Admitting: Family Medicine

## 2020-12-09 NOTE — Telephone Encounter (Signed)
ERROR

## 2020-12-17 DIAGNOSIS — Z03818 Encounter for observation for suspected exposure to other biological agents ruled out: Secondary | ICD-10-CM | POA: Diagnosis not present

## 2020-12-17 DIAGNOSIS — Z85828 Personal history of other malignant neoplasm of skin: Secondary | ICD-10-CM | POA: Diagnosis not present

## 2020-12-17 DIAGNOSIS — L821 Other seborrheic keratosis: Secondary | ICD-10-CM | POA: Diagnosis not present

## 2020-12-17 DIAGNOSIS — Z20822 Contact with and (suspected) exposure to covid-19: Secondary | ICD-10-CM | POA: Diagnosis not present

## 2020-12-17 DIAGNOSIS — L820 Inflamed seborrheic keratosis: Secondary | ICD-10-CM | POA: Diagnosis not present

## 2021-01-03 DIAGNOSIS — M79671 Pain in right foot: Secondary | ICD-10-CM | POA: Diagnosis not present

## 2021-01-03 DIAGNOSIS — M2022 Hallux rigidus, left foot: Secondary | ICD-10-CM | POA: Diagnosis not present

## 2021-01-03 DIAGNOSIS — M2021 Hallux rigidus, right foot: Secondary | ICD-10-CM | POA: Diagnosis not present

## 2021-01-03 DIAGNOSIS — Z4889 Encounter for other specified surgical aftercare: Secondary | ICD-10-CM | POA: Diagnosis not present

## 2021-01-06 ENCOUNTER — Telehealth: Payer: Self-pay | Admitting: Family Medicine

## 2021-01-06 NOTE — Telephone Encounter (Signed)
Spoke with patient state she will call back today.

## 2021-01-23 ENCOUNTER — Other Ambulatory Visit: Payer: Self-pay

## 2021-01-23 ENCOUNTER — Encounter (HOSPITAL_COMMUNITY): Payer: Self-pay | Admitting: Nurse Practitioner

## 2021-01-23 ENCOUNTER — Ambulatory Visit (HOSPITAL_COMMUNITY)
Admission: RE | Admit: 2021-01-23 | Discharge: 2021-01-23 | Disposition: A | Discharge status: Home / Self Care | Payer: Medicare HMO | Source: Ambulatory Visit | Attending: Nurse Practitioner | Admitting: Nurse Practitioner

## 2021-01-23 VITALS — BP 122/84 | HR 74 | Ht 66.0 in | Wt 218.0 lb

## 2021-01-23 DIAGNOSIS — Z87891 Personal history of nicotine dependence: Secondary | ICD-10-CM | POA: Diagnosis not present

## 2021-01-23 DIAGNOSIS — D6869 Other thrombophilia: Secondary | ICD-10-CM | POA: Diagnosis not present

## 2021-01-23 DIAGNOSIS — I480 Paroxysmal atrial fibrillation: Secondary | ICD-10-CM | POA: Diagnosis not present

## 2021-01-23 DIAGNOSIS — Z95818 Presence of other cardiac implants and grafts: Secondary | ICD-10-CM | POA: Diagnosis not present

## 2021-01-23 DIAGNOSIS — Z8249 Family history of ischemic heart disease and other diseases of the circulatory system: Secondary | ICD-10-CM | POA: Diagnosis not present

## 2021-01-23 NOTE — Progress Notes (Signed)
Primary Care Physician: Ma Hillock, DO Referring Physician: Dr. Redge Gainer [name] is a 70 y.o. female with a h/o paroxysmal afib that is in the clinic as f/u from  last visit with Dr.Allred.  She recently had a Linq inserted and is in the afib clinic for f/u. Linq shows 40 episodes of afib since linq inserted.  CHA2DS2VASc score of 2, she is not on anticoagulation. She currently is not on daily rate control.  Pt is very unhappy since getting the linq. She is mad at herself for not asking questions about it. It made her super anxious to the point she had to go on anxiety meds. "With all the lasers, satellites out there, all that power/battery in my body, being tracked like this, I just want  it out of my body asap. She thinks it has triggered more afib. She also feels that gaining weight has triggered more afib and now is exercising, and following [facility] weight loss and has lost 36 lbs. She wants to lose another 50 lbs.   F/u in the afib clinic, 01/23/21. She is in  SR. She  has not noted any afib. She hopes to buy a [employer]. She  just had liposuction on her abdomen and  hopes to have it on her back in several months. She is hoping this will allow her to lose 60 lbs. She is not on anticoagulation for a CHA2DS2VASc score of 2.   Today, she denies symptoms of palpitations, chest pain, shortness of breath, orthopnea, PND, lower extremity edema, dizziness, presyncope, syncope, or neurologic sequela. The patient is tolerating medications without difficulties and is otherwise without complaint today.   Past Medical History:  Diagnosis Date  . Acute conjunctivitis of both eyes 04/05/2019  . Allergy   . Anxiety   . Arthritis   . Chicken pox   . Colon polyp   . Essential hypertension 10/03/2018   patient denies diagnosis of afib  . Fibroids    Uterine  . GERD (gastroesophageal reflux disease)   . Heart murmur   . History of colon polyps   . Hyperlipidemia   . Overweight     Past Surgical History:  Procedure Laterality Date  . implantable loop recorder placement  12/11/2019    Medtronic Reveal Linq model LNQ 2 [id number]) implantable loop recorder  . implantable loop recorder removal  01/25/2020   MDT Reveal LINQ removed in office due to patient request due to anxiety of having an implanted device,  removed without complication  . NOSE SURGERY    . ROTATOR CUFF REPAIR    . TONSILLECTOMY  1970  . WISDOM TOOTH EXTRACTION      Current Outpatient Medications  Medication Sig Dispense Refill  . Ascorbic Acid (VITAMIN C PO) Take 1 tablet by mouth daily.    . Cholecalciferol (VITAMIN D3) 10 MCG (400 UNIT) CAPS Take 1 tablet by mouth daily.    . Cyanocobalamin (VITAMIN B 12 PO) Take 1 tablet by mouth daily.    . Multiple Vitamin (MULTIVITAMIN) tablet Take 1 tablet by mouth daily.    . Omega 3 1000 MG CAPS Take 1 capsule by mouth daily.    Marland Kitchen sulfacetamide (BLEPH-10) 10 % ophthalmic solution     . fluticasone (FLONASE) 50 MCG/ACT nasal spray Place 2 sprays into both nostrils daily. (Patient not taking: No sig reported) 16 g 6   No current facility-administered medications for this encounter.    No Known Allergies  Social  History   Socioeconomic History  . Marital status: Single    Spouse name: Not on file  . Number of children: Not on file  . Years of education: Not on file  . Highest education level: Not on file  Occupational History  . Not on file  Tobacco Use  . Smoking status: Former Smoker    Years: 25.00    Types: Cigarettes  . Smokeless tobacco: Never Used  . Tobacco comment: smoked off and on for about 25 years  Vaping Use  . Vaping Use: Never used  Substance and Sexual Activity  . Alcohol use: Yes    Alcohol/week: 0.0 standard drinks    Comment: seldom  . Drug use: No  . Sexual activity: Yes    Partners: Male  Other Topics Concern  . Not on file  Social History Narrative   Marital status/children/pets: Single.     Education/employment: retired Librarian, academic:      -smoke alarm in the home:Yes     - wears seatbelt: Yes     - Feels safe in their relationships: Yes   Social Determinants of Radio broadcast assistant Strain: Not on file  Food Insecurity: Not on file  Transportation Needs: Not on file  Physical Activity: Not on file  Stress: Not on file  Social Connections: Not on file  Intimate Partner Violence: Not on file    Family History  Problem Relation Age of Onset  . Hearing loss Mother   . Macular degeneration Mother   . Alzheimer's disease Father   . Hypertension Father   . Heart disease Father   . Stomach cancer Maternal Grandmother   . Ovarian cancer Maternal Grandmother   . Emphysema Maternal Grandfather   . Colon cancer Maternal Uncle   . Lung cancer Sister   . Heart disease Brother   . Esophageal cancer Neg Hx   . Rectal cancer Neg Hx   . Breast cancer Neg Hx     ROS- All systems are reviewed and negative except as per the HPI above  Physical Exam: Vitals:   01/23/21 1043  BP: 122/84  Pulse: 74  Weight: 98.9 kg  Height: 5\' 6"  (1.676 m)   Wt Readings from Last 3 Encounters:  01/23/21 98.9 kg  11/15/20 105.2 kg  10/04/20 94.8 kg    Labs: Lab Results  Component Value Date   NA 146 11/15/2020   K 3.9 11/15/2020   CL 109 11/15/2020   CO2 21 11/15/2020   GLUCOSE 95 11/15/2020   BUN 12 11/15/2020   CREATININE 0.81 11/15/2020   CALCIUM 8.7 11/15/2020   No results found for: INR Lab Results  Component Value Date   CHOL 240 (H) 10/07/2018   HDL 51.10 10/07/2018   LDLCALC 159 (H) 10/07/2018   TRIG 151.0 (H) 10/07/2018     GEN- The patient is well appearing, alert and oriented x 3 today.   Head- normocephalic, atraumatic Eyes-  Sclera clear, conjunctiva pink Ears- hearing intact Oropharynx- clear Neck- supple, no JVP Lymph- no cervical lymphadenopathy Lungs- Clear to ausculation bilaterally, normal work of breathing Heart-  Regular rate and rhythm, no murmurs, rubs or gallops, PMI not laterally displaced GI- soft, NT, ND, + BS Extremities- no clubbing, cyanosis, or edema MS- no significant deformity or atrophy Skin- no rash or lesion Psych- euthymic mood, full affect Neuro- strength and sensation are intact  EKG-NSR at 74 bpm, PR int 142 ms, qrs int 78 ms, qtc 435  ms Epic records reviewed   Assessment and Plan: 1. Paroxysmal afib Quiet, SR today, has not noted any afib No rate control on board  Currently chadsvasc score is 2 and by guidelines does not require anticoagulation, she has preferred to not be on anticoagulation   F/u in 7 months    Butch Penny C. Shi Grose, [facility] [address] [place], [place] [address] [phone]

## 2021-02-05 ENCOUNTER — Ambulatory Visit: Payer: Medicare HMO

## 2021-04-28 ENCOUNTER — Telehealth: Payer: Self-pay

## 2021-04-28 ENCOUNTER — Other Ambulatory Visit: Payer: Self-pay | Admitting: Physician Assistant

## 2021-04-28 DIAGNOSIS — Z1231 Encounter for screening mammogram for malignant neoplasm of breast: Secondary | ICD-10-CM

## 2021-04-28 NOTE — Telephone Encounter (Signed)
Pt est with Alinda Dooms, PA-C. Shredding order.

## 2021-04-28 NOTE — Telephone Encounter (Signed)
Received orders to be signed from [facility] for bone density test. Pt does not have PCP listed. Was PCP removed in error?

## 2021-07-09 ENCOUNTER — Other Ambulatory Visit: Payer: Self-pay

## 2021-07-09 ENCOUNTER — Ambulatory Visit (HOSPITAL_COMMUNITY)
Admission: RE | Admit: 2021-07-09 | Discharge: 2021-07-09 | Disposition: A | Discharge status: Home / Self Care | Payer: Medicare HMO | Source: Ambulatory Visit | Attending: Nurse Practitioner | Admitting: Nurse Practitioner

## 2021-07-09 VITALS — BP 120/76 | HR 74 | Ht 66.0 in | Wt 204.0 lb

## 2021-07-09 DIAGNOSIS — Z8249 Family history of ischemic heart disease and other diseases of the circulatory system: Secondary | ICD-10-CM | POA: Diagnosis not present

## 2021-07-09 DIAGNOSIS — I480 Paroxysmal atrial fibrillation: Secondary | ICD-10-CM | POA: Diagnosis present

## 2021-07-09 DIAGNOSIS — Z09 Encounter for follow-up examination after completed treatment for conditions other than malignant neoplasm: Secondary | ICD-10-CM | POA: Insufficient documentation

## 2021-07-09 DIAGNOSIS — Z87891 Personal history of nicotine dependence: Secondary | ICD-10-CM | POA: Insufficient documentation

## 2021-07-09 DIAGNOSIS — Z79899 Other long term (current) drug therapy: Secondary | ICD-10-CM | POA: Insufficient documentation

## 2021-07-09 MED ORDER — DILTIAZEM HCL 30 MG PO TABS: ORAL_TABLET | ORAL | 1 refills | Status: DC

## 2021-07-09 NOTE — Progress Notes (Signed)
Primary Care Physician: No primary care provider on file. Referring Physician: Dr. Redge Gainer [name] is a 69 y.o. female with a h/o paroxysmal afib that is in the clinic as f/u from  last visit with Dr.Allred.  She recently had a Linq inserted and is in the afib clinic for f/u. Linq shows 40 episodes of afib since linq inserted.  CHA2DS2VASc score of 2, she is not on anticoagulation. She currently is not on daily rate control.  Pt is very unhappy since getting the linq. She is mad at herself for not asking questions about it. It made her super anxious to the point she had to go on anxiety meds. "With all the lasers, satellites out there, all that power/battery in my body, being tracked like this, I just want  it out of my body asap. She thinks it has triggered more afib. She also feels that gaining weight has triggered more afib and now is exercising, and following [facility] weight loss and has lost 36 lbs. She wants to lose another 50 lbs.   F/u in the afib clinic, 01/23/21. She is in  SR. She  has not noted any afib. She hopes to buy a [employer]. She  just had liposuction on her abdomen and  hopes to have it on her back in several months. She is hoping this will allow her to lose 60 lbs. She is not on anticoagulation for a CHA2DS2VASc score of 2.   F/u in afib clinic, 07/09/21. She reports that she was hiking the other Sunday and noted HR at 160 bpm. She felt this was over and above a normal elevation of HR with exercise. She was not overly hot and was drinking sufficient water. This lasted for several minutes. Otherwise her afib has been quiet. She is exercising and losing weight.   Today, she denies symptoms of palpitations, chest pain, shortness of breath, orthopnea, PND, lower extremity edema, dizziness, presyncope, syncope, or neurologic sequela. The patient is tolerating medications without difficulties and is otherwise without complaint today.   Past Medical History:  Diagnosis  Date   Acute conjunctivitis of both eyes 04/05/2019   Allergy    Anxiety    Arthritis    Chicken pox    Colon polyp    Essential hypertension 10/03/2018   patient denies diagnosis of afib   Fibroids    Uterine   GERD (gastroesophageal reflux disease)    Heart murmur    History of colon polyps    Hyperlipidemia    Overweight    Past Surgical History:  Procedure Laterality Date   implantable loop recorder placement  12/11/2019    Medtronic Reveal Linq model LNQ 2 [id number]) implantable loop recorder   implantable loop recorder removal  01/25/2020   MDT Reveal LINQ removed in office due to patient request due to anxiety of having an implanted device,  removed without complication   NOSE SURGERY     [place] EXTRACTION      Current Outpatient Medications  Medication Sig Dispense Refill   diltiazem (CARDIZEM) 30 MG tablet Cardizem '30mg'$  -- take 1 tablet every 4 hours AS NEEDED for heart rate >100 30 tablet 1   Multiple Vitamin (MULTIVITAMIN) tablet Take 1 tablet by mouth daily.     sulfacetamide (BLEPH-10) 10 % ophthalmic solution Place 1 drop into both eyes as needed.     Ascorbic Acid (VITAMIN C PO)  Take 1 tablet by mouth daily. (Patient not taking: Reported on 07/09/2021)     Cholecalciferol (VITAMIN D3) 10 MCG (400 UNIT) CAPS Take 1 tablet by mouth daily. (Patient not taking: Reported on 07/09/2021)     Cyanocobalamin (VITAMIN B 12 PO) Take 1 tablet by mouth daily. (Patient not taking: Reported on 07/09/2021)     fluticasone (FLONASE) 50 MCG/ACT nasal spray Place 2 sprays into both nostrils daily. (Patient not taking: Reported on 07/09/2021) 16 g 6   Omega 3 1000 MG CAPS Take 1 capsule by mouth daily. (Patient not taking: Reported on 07/09/2021)     No current facility-administered medications for this encounter.    No Known Allergies  Social History   Socioeconomic History   Marital status: Single    Spouse name: Not on file    Number of children: Not on file   Years of education: Not on file   Highest education level: Not on file  Occupational History   Not on file  Tobacco Use   Smoking status: Former    Years: 25.00    Types: Cigarettes   Smokeless tobacco: Never   Tobacco comments:    smoked off and on for about 25 years  Vaping Use   Vaping Use: Never used  Substance and Sexual Activity   Alcohol use: Yes    Alcohol/week: 0.0 standard drinks    Comment: seldom   Drug use: No   Sexual activity: Yes    Partners: Male  Other Topics Concern   Not on file  Social History Narrative   Marital status/children/pets: Single.    Education/employment: retired Librarian, academic:      -smoke alarm in the home:Yes     - wears seatbelt: Yes     - Feels safe in their relationships: Yes   Social Determinants of Health   Financial Resource Strain: Not on file  Food Insecurity: Not on file  Transportation Needs: Not on file  Physical Activity: Not on file  Stress: Not on file  Social Connections: Not on file  Intimate Partner Violence: Not on file    Family History  Problem Relation Age of Onset   Hearing loss Mother    Macular degeneration Mother    Alzheimer's disease Father    Hypertension Father    Heart disease Father    Stomach cancer Maternal Grandmother    Ovarian cancer Maternal Grandmother    Emphysema Maternal Grandfather    Colon cancer Maternal Uncle    Lung cancer Sister    Heart disease Brother    Esophageal cancer Neg Hx    Rectal cancer Neg Hx    Breast cancer Neg Hx     ROS- All systems are reviewed and negative except as per the HPI above  Physical Exam: Vitals:   07/09/21 1436  BP: 120/76  Pulse: 74  Weight: 92.5 kg  Height: '5\' 6"'$  (1.676 m)   Wt Readings from Last 3 Encounters:  07/09/21 92.5 kg  01/23/21 98.9 kg  11/15/20 105.2 kg    Labs: Lab Results  Component Value Date   NA 146 11/15/2020   K 3.9 11/15/2020   CL 109 11/15/2020   CO2  21 11/15/2020   GLUCOSE 95 11/15/2020   BUN 12 11/15/2020   CREATININE 0.81 11/15/2020   CALCIUM 8.7 11/15/2020   No results found for: INR Lab Results  Component Value Date   CHOL 240 (H) 10/07/2018   HDL 51.10 10/07/2018  LDLCALC 159 (H) 10/07/2018   TRIG 151.0 (H) 10/07/2018     GEN- The patient is well appearing, alert and oriented x 3 today.   Head- normocephalic, atraumatic Eyes-  Sclera clear, conjunctiva pink Ears- hearing intact Oropharynx- clear Neck- supple, no JVP Lymph- no cervical lymphadenopathy Lungs- Clear to ausculation bilaterally, normal work of breathing Heart- Regular rate and rhythm, no murmurs, rubs or gallops, PMI not laterally displaced GI- soft, NT, ND, + BS Extremities- no clubbing, cyanosis, or edema MS- no significant deformity or atrophy Skin- no rash or lesion Psych- euthymic mood, full affect Neuro- strength and sensation are intact  EKG-NSR at 74 bpm, PR int 146 ms, qrs int 76 ms, qtc 452 ms Epic records reviewed   Assessment and Plan: 1. Paroxysmal afib Quiet until recent hike Will prescribe 30 mg Cardizem to use as needed for elevation of HR No rate control on board and would hesitate to use daily rate control unless her burden significantly increases  Currently chadsvasc score is 2 and by guidelines does not require anticoagulation, she has preferred  not be on anticoagulation   F/u afib clinic as  needed    Butch Penny C. [place] Dorey, [facility] [address] [place], [place] [address] [phone]

## 2021-08-09 IMAGING — DX DG CHEST 2V
2 series · 2 of 2 positions shown · non-contrast
Comparison: June 01, 2012

CLINICAL DATA: Cough.

EXAM:
CHEST - 2 VIEW

[chest pa]
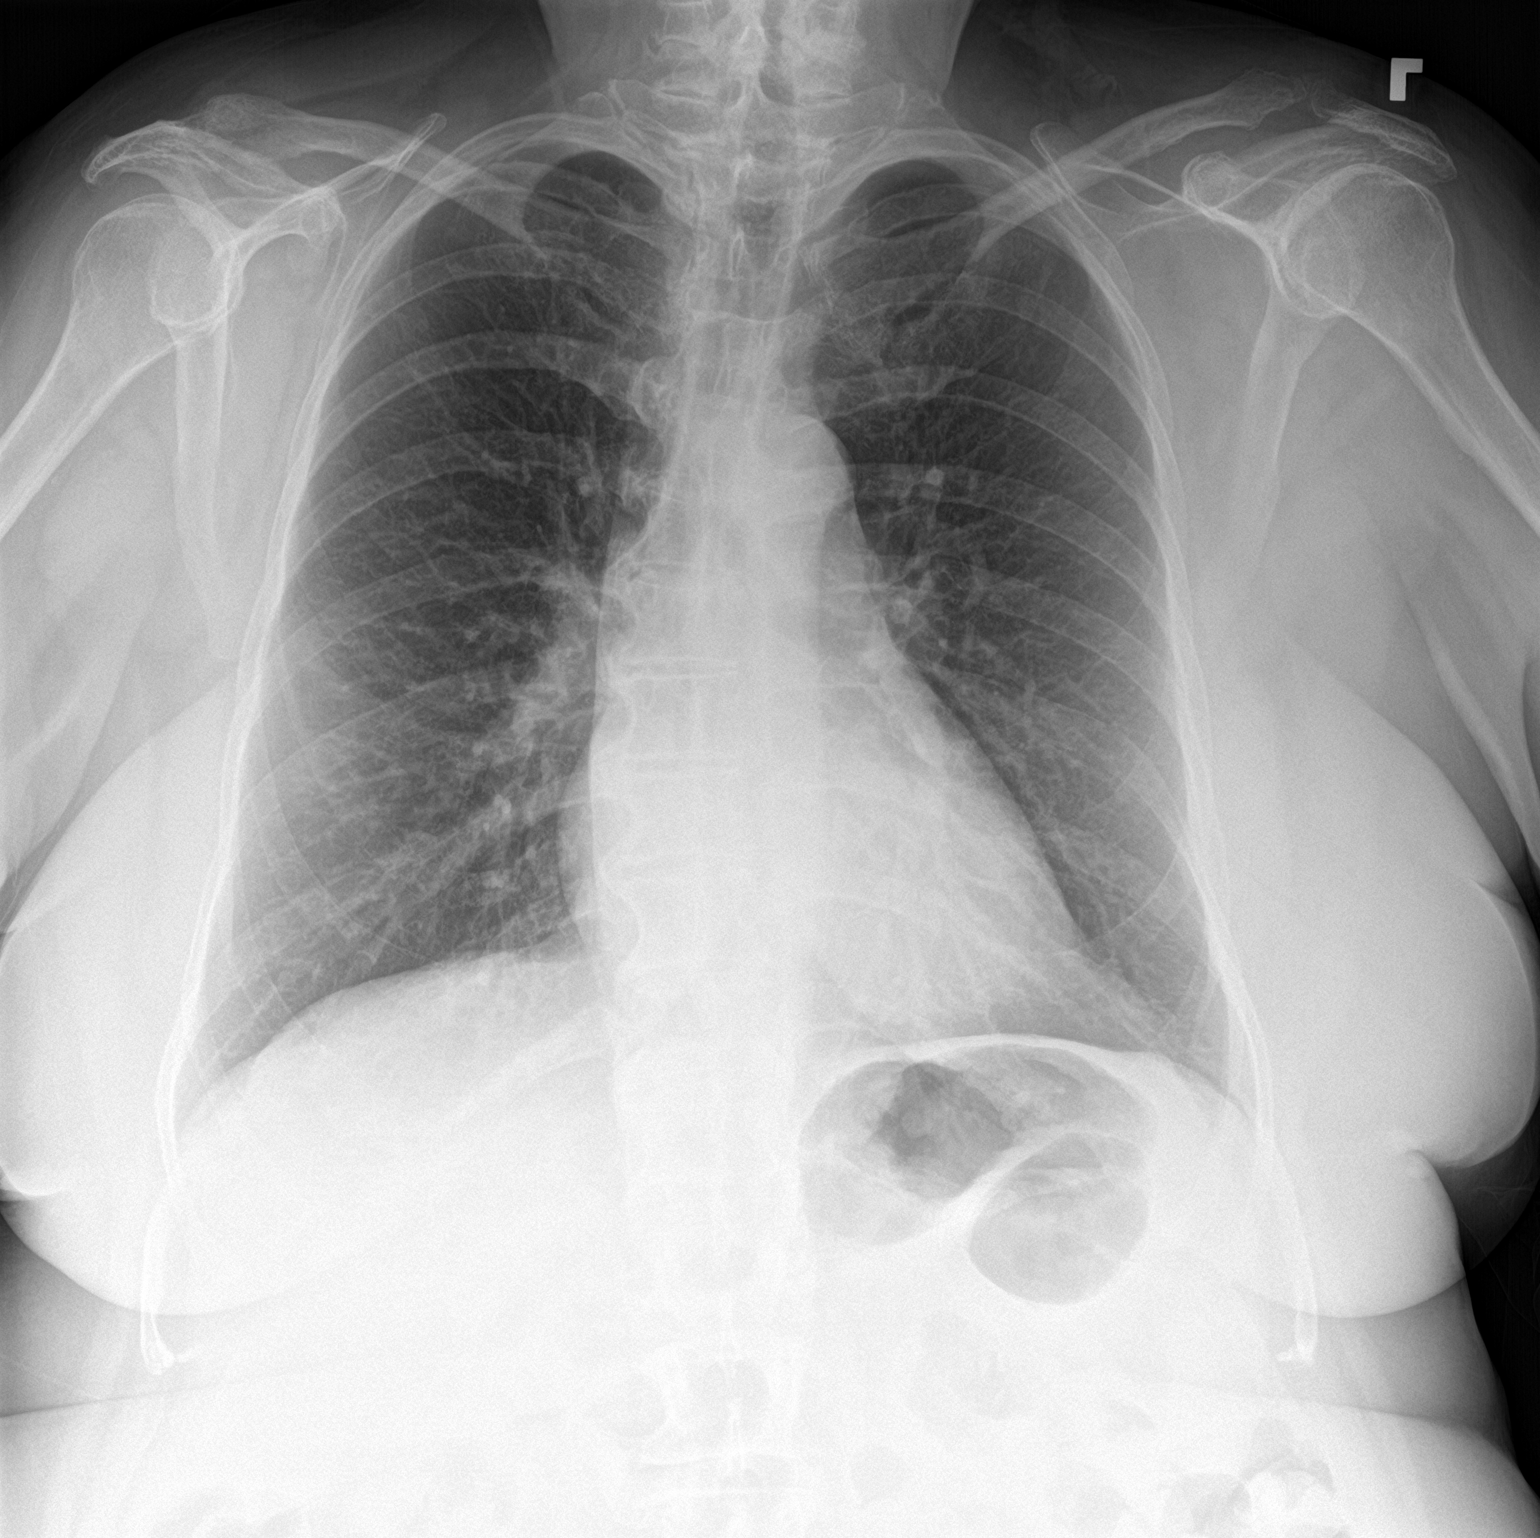

[chest lat]
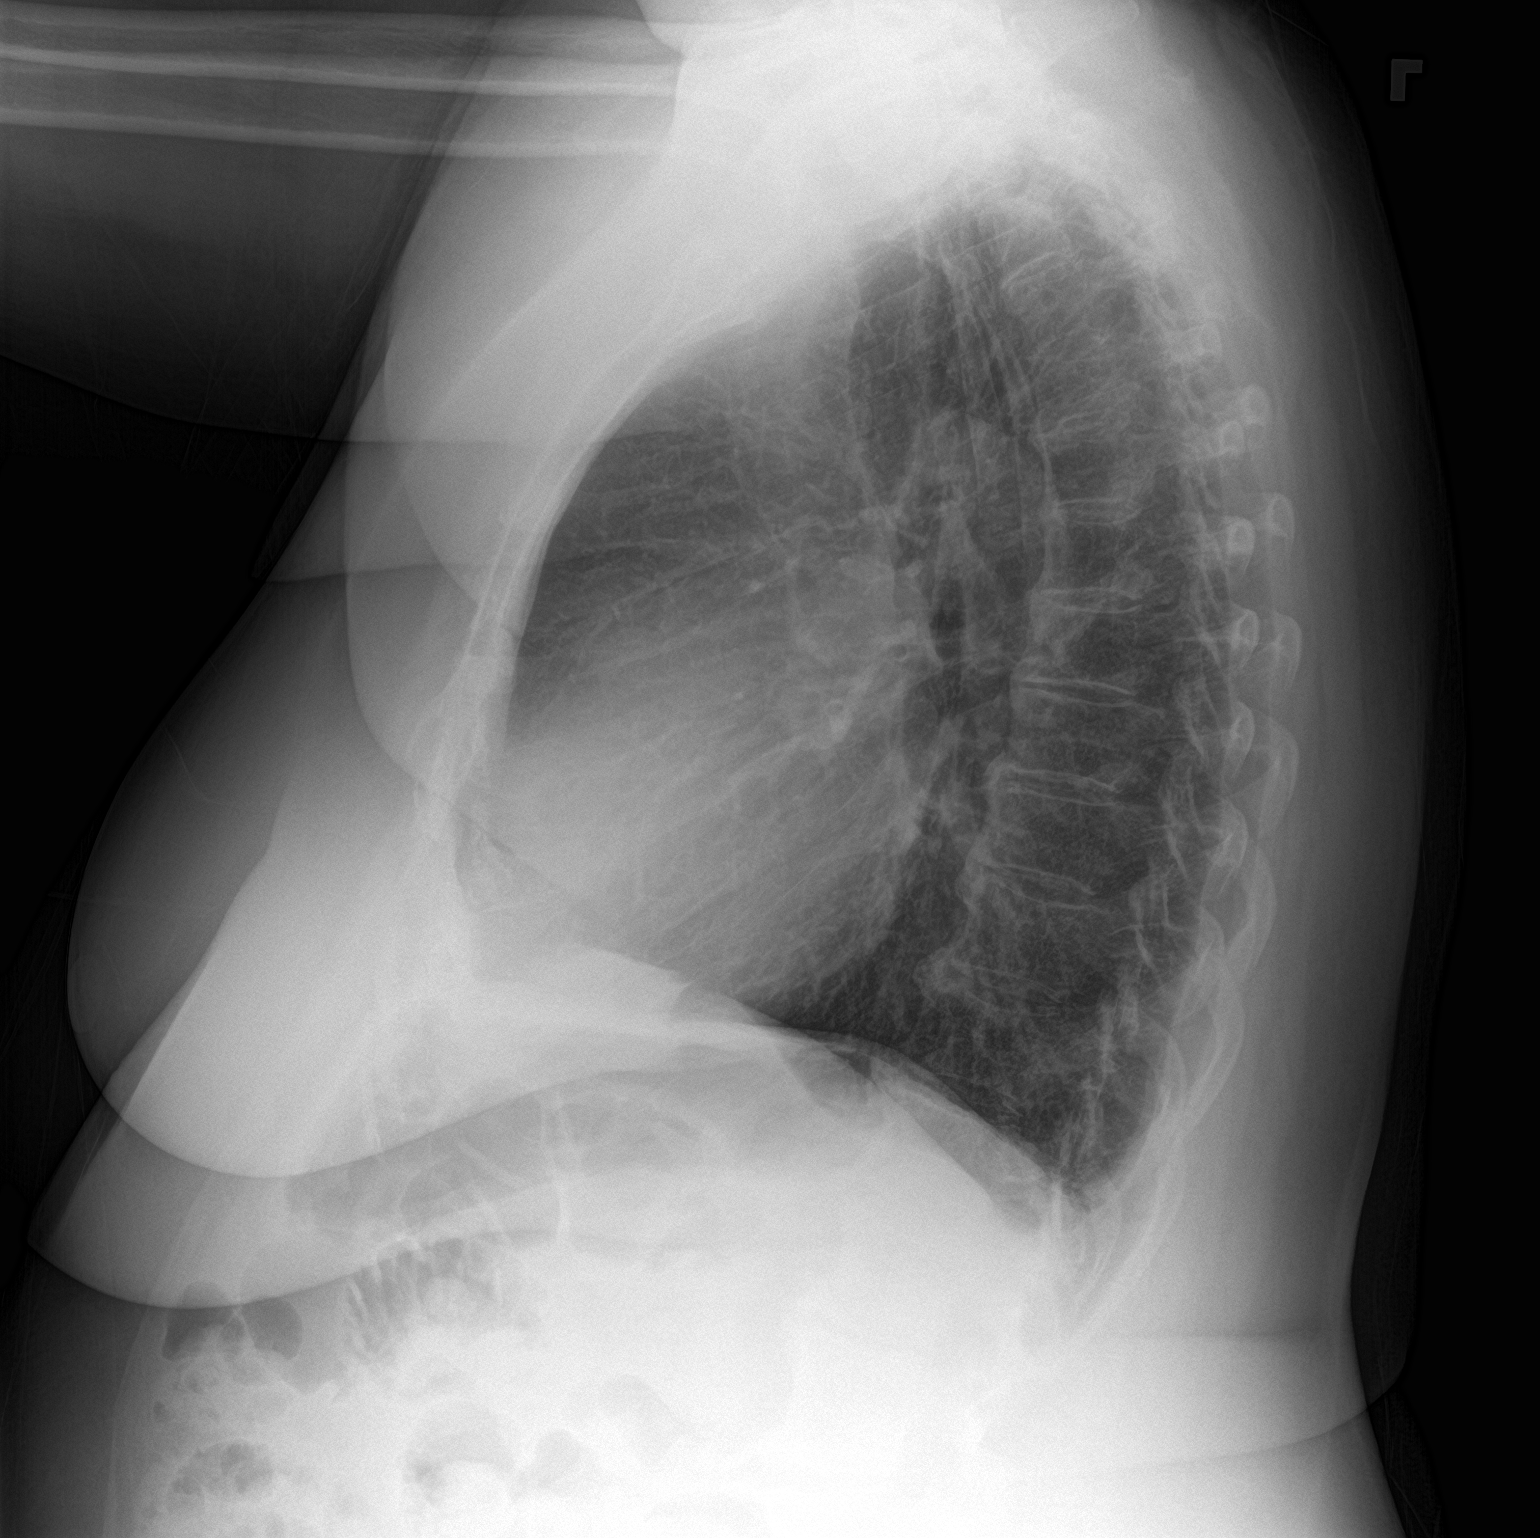

[2 of 2 positions shown; findings below may reference images not displayed]

FINDINGS: The heart size and mediastinal contours are within normal limits.
Both lungs are clear. The visualized skeletal structures are
unremarkable.
IMPRESSION: No active cardiopulmonary disease.

## 2022-12-10 ENCOUNTER — Encounter (HOSPITAL_COMMUNITY): Payer: Self-pay | Admitting: *Deleted

## 2023-01-01 HISTORY — PX: ATRIAL FIBRILLATION ABLATION: SHX5732

## 2023-07-13 ENCOUNTER — Other Ambulatory Visit: Payer: Self-pay | Admitting: Radiology

## 2023-07-14 LAB — SURGICAL PATHOLOGY

## 2023-07-15 ENCOUNTER — Telehealth: Payer: Self-pay | Admitting: Hematology and Oncology

## 2023-07-15 NOTE — Telephone Encounter (Signed)
Spoke to patient to confirm upcoming morning [facility] clinic appointment on 9/18, paperwork will be sent via [facility].  Gave location and time, also informed patient that the surgeon's office would be calling as well to get information from them similar to the packet that they will be receiving so make sure to do both.  Reminded patient that all providers will be coming to the clinic to see them HERE and if they had any questions to not hesitate to reach back out to myself or their navigators.

## 2023-07-19 ENCOUNTER — Encounter: Payer: Self-pay | Admitting: *Deleted

## 2023-07-19 DIAGNOSIS — C50412 Malignant neoplasm of upper-outer quadrant of left female breast: Secondary | ICD-10-CM | POA: Insufficient documentation

## 2023-07-21 ENCOUNTER — Ambulatory Visit: Payer: Self-pay | Admitting: Surgery

## 2023-07-21 ENCOUNTER — Encounter: Payer: Self-pay | Admitting: *Deleted

## 2023-07-21 ENCOUNTER — Inpatient Hospital Stay (HOSPITAL_BASED_OUTPATIENT_CLINIC_OR_DEPARTMENT_OTHER): Payer: Medicare HMO | Admitting: Genetic Counselor

## 2023-07-21 ENCOUNTER — Inpatient Hospital Stay: Payer: Medicare HMO | Attending: Hematology and Oncology

## 2023-07-21 ENCOUNTER — Inpatient Hospital Stay (HOSPITAL_BASED_OUTPATIENT_CLINIC_OR_DEPARTMENT_OTHER): Payer: Medicare HMO | Admitting: Hematology and Oncology

## 2023-07-21 ENCOUNTER — Encounter: Payer: Self-pay | Admitting: Genetic Counselor

## 2023-07-21 ENCOUNTER — Ambulatory Visit: Payer: Medicare HMO | Admitting: Physical Therapy

## 2023-07-21 ENCOUNTER — Ambulatory Visit
Admission: RE | Admit: 2023-07-21 | Discharge: 2023-07-21 | Disposition: A | Discharge status: Home / Self Care | Payer: Medicare HMO | Source: Ambulatory Visit | Attending: Radiation Oncology | Admitting: Radiation Oncology

## 2023-07-21 VITALS — BP 151/79 | HR 67 | Temp 97.9°F | Resp 18 | Ht 66.0 in | Wt 217.1 lb

## 2023-07-21 DIAGNOSIS — Z170 Estrogen receptor positive status [ER+]: Secondary | ICD-10-CM

## 2023-07-21 DIAGNOSIS — C50412 Malignant neoplasm of upper-outer quadrant of left female breast: Secondary | ICD-10-CM

## 2023-07-21 DIAGNOSIS — C50912 Malignant neoplasm of unspecified site of left female breast: Secondary | ICD-10-CM

## 2023-07-21 DIAGNOSIS — Z8041 Family history of malignant neoplasm of ovary: Secondary | ICD-10-CM

## 2023-07-21 DIAGNOSIS — Z87891 Personal history of nicotine dependence: Secondary | ICD-10-CM | POA: Diagnosis not present

## 2023-07-21 DIAGNOSIS — Z800 Family history of malignant neoplasm of digestive organs: Secondary | ICD-10-CM

## 2023-07-21 DIAGNOSIS — Z8042 Family history of malignant neoplasm of prostate: Secondary | ICD-10-CM

## 2023-07-21 LAB — CBC WITH DIFFERENTIAL (CANCER CENTER ONLY)
Abs Immature Granulocytes: 0.02 10*3/uL (ref 0.00–0.07)
Basophils Absolute: 0.1 10*3/uL (ref 0.0–0.1)
Basophils Relative: 1 %
Eosinophils Absolute: 0.2 10*3/uL (ref 0.0–0.5)
Eosinophils Relative: 2 %
HCT: 42.2 % (ref 36.0–46.0)
Hemoglobin: 14.2 g/dL (ref 12.0–15.0)
Immature Granulocytes: 0 %
Lymphocytes Relative: 27 %
Lymphs Abs: 2.4 10*3/uL (ref 0.7–4.0)
MCH: 31.3 pg (ref 26.0–34.0)
MCHC: 33.6 g/dL (ref 30.0–36.0)
MCV: 93.2 fL (ref 80.0–100.0)
Monocytes Absolute: 0.6 10*3/uL (ref 0.1–1.0)
Monocytes Relative: 7 %
Neutro Abs: 5.5 10*3/uL (ref 1.7–7.7)
Neutrophils Relative %: 63 %
Platelet Count: 251 10*3/uL (ref 150–400)
RBC: 4.53 MIL/uL (ref 3.87–5.11)
RDW: 12.7 % (ref 11.5–15.5)
WBC Count: 8.8 10*3/uL (ref 4.0–10.5)
nRBC: 0 % (ref 0.0–0.2)

## 2023-07-21 LAB — CMP (CANCER CENTER ONLY)
ALT: 18 U/L (ref 0–44)
AST: 16 U/L (ref 15–41)
Albumin: 3.9 g/dL (ref 3.5–5.0)
Alkaline Phosphatase: 74 U/L (ref 38–126)
Anion gap: 3 — ABNORMAL LOW (ref 5–15)
BUN: 16 mg/dL (ref 8–23)
CO2: 29 mmol/L (ref 22–32)
Calcium: 9 mg/dL (ref 8.9–10.3)
Chloride: 108 mmol/L (ref 98–111)
Creatinine: 0.97 mg/dL (ref 0.44–1.00)
GFR, Estimated: >60 mL/min (ref >60)
Glucose, Bld: 100 mg/dL — ABNORMAL HIGH (ref 70–99)
Potassium: 4.3 mmol/L (ref 3.5–5.1)
Sodium: 140 mmol/L (ref 135–145)
Total Bilirubin: 0.4 mg/dL (ref 0.3–1.2)
Total Protein: 7 g/dL (ref 6.5–8.1)

## 2023-07-21 LAB — GENETIC SCREENING ORDER

## 2023-07-21 NOTE — Progress Notes (Signed)
[facility] CONSULT NOTE  Patient Care Team: Hillis Range, MD (Inactive) as PCP - Cardiology (Cardiology) Hillis Range, MD (Inactive) as Consulting Physician (Cardiology) Sallye Lat, MD as Consulting Physician (Ophthalmology) Harriette Bouillon, MD as Consulting Physician (General Surgery) [name], MD as Consulting Physician (Hematology and Oncology) Antony Blackbird, MD as Consulting Physician (Radiation Oncology) Donnelly Angelica, RN as Oncology Nurse Navigator Pershing Proud, RN as Oncology Nurse Navigator  CHIEF COMPLAINTS/PURPOSE OF CONSULTATION:  Newly diagnosed breast cancer  HISTORY OF PRESENTING ILLNESS:  [name] 72 y.o. female is here because of recent diagnosis of left breast IDC.  I reviewed her records extensively and collaborated the history with the patient.  SUMMARY OF ONCOLOGIC HISTORY: Oncology History  Malignant neoplasm of upper-outer quadrant of left breast in female, estrogen receptor positive (HCC)  07/09/2023 Mammogram   Patient had screening mammogram and diagnostic mammogram which confirmed a 1.1 x 1 x 0.8 cm irregular mass in the left breast at 12:00 middle depth 6 cm from the nipple, irregular mass is hypoechoic.  Ultrasound-guided biopsy is recommended.  No significant abnormalities were seen sonographically in the left axilla.   07/13/2023 Pathology Results   Left breast needle core biopsy showed overall grade 1 invasive ductal carcinoma prognostics indicated ER 100% positive strong staining, PR 100% positive strong staining, Ki-67 of 20% and group 5 HER2 negative   07/19/2023 Initial Diagnosis   Malignant neoplasm of upper-outer quadrant of left breast in female, estrogen receptor positive (HCC)    Discussed the use of AI scribe software for clinical note transcription with the patient, who gave verbal consent to proceed.  History of Present Illness   The patient, a long-term vegetarian, presents with a recent  diagnosis of grade one invasive ductal breast cancer. She is scheduled for surgery and is aware that she may not need chemotherapy but may require radiation and a five-year course of an anti-estrogen pill. The patient is optimistic about her prognosis and is at peace with her diagnosis. She has a strong support system, including her husband who accompanies her to appointments. The patient has concerns about her weight, which she plans to address after her surgery.      MEDICAL HISTORY:  Past Medical History:  Diagnosis Date   Acute conjunctivitis of both eyes 04/05/2019   Allergy    Anxiety    Arthritis    Chicken pox    Colon polyp    Essential hypertension 10/03/2018   patient denies diagnosis of afib   Fibroids    Uterine   GERD (gastroesophageal reflux disease)    Heart murmur    History of colon polyps    Hyperlipidemia    Overweight     SURGICAL HISTORY: Past Surgical History:  Procedure Laterality Date   implantable loop recorder placement  12/11/2019    Medtronic Reveal Linq model LNQ 2 ([id number]) implantable loop recorder   implantable loop recorder removal  01/25/2020   MDT Reveal LINQ removed in office due to patient request due to anxiety of having an implanted device,  removed without complication   NOSE SURGERY     ROTATOR CUFF REPAIR     TONSILLECTOMY  1970   WISDOM TOOTH EXTRACTION      SOCIAL HISTORY: Social History   Socioeconomic History   Marital status: Single    Spouse name: Not on file   Number of children: Not on file   Years of education: Not on file   Highest education level:  Not on file  Occupational History   Not on file  Tobacco Use   Smoking status: Former    Types: Cigarettes   Smokeless tobacco: Never   Tobacco comments:    smoked off and on for about 25 years  Vaping Use   Vaping status: Never Used  Substance and Sexual Activity   Alcohol use: Yes    Alcohol/week: 0.0 standard drinks of alcohol    Comment: seldom   Drug use:  No   Sexual activity: Yes    Partners: Male  Other Topics Concern   Not on file  Social History Narrative   Marital status/children/pets: Single.    Education/employment: retired Photographer:      -smoke alarm in the home:Yes     - wears seatbelt: Yes     - Feels safe in their relationships: Yes   Social Determinants of Health   Financial Resource Strain: Low Risk  (01/13/2023)   Received from [facility], [facility]   Overall Financial Resource Strain (CARDIA)    Difficulty of Paying Living Expenses: Not hard at all  Food Insecurity: No Food Insecurity (11/04/2022)   Received from [facility], [facility]   Hunger Vital Sign    Worried About Running Out of Food in the Last Year: Never true    Ran Out of Food in the Last Year: Never true  Transportation Needs: No Transportation Needs (11/04/2022)   Received from [facility], [facility]   PRAPARE - Transportation    Lack of Transportation (Medical): No    Lack of Transportation (Non-Medical): No  Physical Activity: Unknown (11/04/2022)   Received from [facility]   Exercise Vital Sign    Days of Exercise per Week: 3 days    Minutes of Exercise per Session: Not on file  Recent Concern: Physical Activity - Insufficiently Active (11/04/2022)   Received from [facility]   Exercise Vital Sign    Days of Exercise per Week: 3 days    Minutes of Exercise per Session: 30 min  Stress: No Stress Concern Present (01/13/2023)   Received from [facility] of Occupational Health - Occupational Stress Questionnaire    Feeling of Stress : Only a little  Recent Concern: Stress - Stress Concern Present (11/04/2022)   Received from [facility] of Occupational Health - Occupational Stress Questionnaire    Feeling of Stress : To some extent  Social Connections: Socially Integrated (11/04/2022)   Received from [facility], [facility]   Social Network     How would you rate your social network (family, work, friends)?: Good participation with social networks  Intimate Partner Violence: Not At Risk (11/04/2022)   Received from [facility], [facility]   HITS    Over the last 12 months how often did your partner physically hurt you?: 1    Over the last 12 months how often did your partner insult you or talk down to you?: 1    Over the last 12 months how often did your partner threaten you with physical harm?: 1    Over the last 12 months how often did your partner scream or curse at you?: 1    FAMILY HISTORY: Family History  Problem Relation Age of Onset   Hearing loss Mother    Macular degeneration Mother    Alzheimer's disease Father    Hypertension Father    Heart disease Father  Stomach cancer Maternal Grandmother    Ovarian cancer Maternal Grandmother    Emphysema Maternal Grandfather    Colon cancer Maternal Uncle    Lung cancer Sister    Heart disease Brother    Esophageal cancer Neg Hx    Rectal cancer Neg Hx    Breast cancer Neg Hx     ALLERGIES:  has No Known Allergies.  MEDICATIONS:  Current Outpatient Medications  Medication Sig Dispense Refill   ALPRAZolam (XANAX) 0.5 MG tablet Take 0.5 mg by mouth at bedtime as needed for anxiety.     Ascorbic Acid (VITAMIN C PO) Take 1 tablet by mouth daily. (Patient not taking: Reported on 07/09/2021)     Cholecalciferol (VITAMIN D3) 10 MCG (400 UNIT) CAPS Take 1 tablet by mouth daily. (Patient not taking: Reported on 07/09/2021)     Cyanocobalamin (VITAMIN B 12 PO) Take 1 tablet by mouth daily. (Patient not taking: Reported on 07/09/2021)     diltiazem (CARDIZEM) 30 MG tablet Cardizem 30mg  -- take 1 tablet every 4 hours AS NEEDED for heart rate >100 30 tablet 1   fluticasone (FLONASE) 50 MCG/ACT nasal spray Place 2 sprays into both nostrils daily. (Patient not taking: Reported on 07/09/2021) 16 g 6   Multiple Vitamin (MULTIVITAMIN) tablet Take 1 tablet by mouth daily.     Omega 3  1000 MG CAPS Take 1 capsule by mouth daily. (Patient not taking: Reported on 07/09/2021)     sulfacetamide (BLEPH-10) 10 % ophthalmic solution Place 1 drop into both eyes as needed.     No current facility-administered medications for this visit.    REVIEW OF SYSTEMS:   Constitutional: Denies fevers, chills or abnormal night sweats Eyes: Denies blurriness of vision, double vision or watery eyes Ears, nose, mouth, throat, and face: Denies mucositis or sore throat Respiratory: Denies cough, dyspnea or wheezes Cardiovascular: Denies palpitation, chest discomfort or lower extremity swelling Gastrointestinal:  Denies nausea, heartburn or change in bowel habits Skin: Denies abnormal skin rashes Lymphatics: Denies new lymphadenopathy or easy bruising Neurological:Denies numbness, tingling or new weaknesses Behavioral/Psych: Mood is stable, no new changes  Breast:  Denies any palpable lumps or discharge All other systems were reviewed with the patient and are negative.  PHYSICAL EXAMINATION: ECOG PERFORMANCE STATUS: 0 - Asymptomatic  Vitals:   07/21/23 0932  BP: (!) 151/79  Pulse: 67  Resp: 18  Temp: 97.9 F (36.6 C)  SpO2: 99%   Filed Weights   07/21/23 0932  Weight: 217 lb 1.6 oz (98.5 kg)    GENERAL:alert, no distress and comfortable SKIN: skin color, texture, turgor are normal, no rashes or significant lesions EYES: normal, conjunctiva are pink and non-injected, sclera clear OROPHARYNX:no exudate, no erythema and lips, buccal mucosa, and tongue normal  NECK: supple, thyroid normal size, non-tender, without nodularity LYMPH:  no palpable lymphadenopathy in the cervical, axillary or inguinal LUNGS: clear to auscultation and percussion with normal breathing effort HEART: regular rate & rhythm and no murmurs and no lower extremity edema ABDOMEN:abdomen soft, non-tender and normal bowel sounds Musculoskeletal:no cyanosis of digits and no clubbing  PSYCH: alert & oriented x 3 with  fluent speech NEURO: no focal motor/sensory deficits BREAST: Small area of induration in the left breast at 1:00, questionable mass versus postbiopsy changes.  No other palpable masses or regional adenopathy.  LABORATORY DATA:  I have reviewed the data as listed Lab Results  Component Value Date   WBC 8.8 07/21/2023   HGB 14.2 07/21/2023   HCT  42.2 07/21/2023   MCV 93.2 07/21/2023   PLT 251 07/21/2023   Lab Results  Component Value Date   NA 140 07/21/2023   K 4.3 07/21/2023   CL 108 07/21/2023   CO2 29 07/21/2023   Pathology   1. Breast, left, needle core biopsy, 12 o'clock, 6cmfn :       - INVASIVE DUCTAL CARCINOMA, SEE NOTE       - TUBULE FORMATION: SCORE 2       - NUCLEAR PLEOMORPHISM: SCORE 2       - MITOTIC COUNT: SCORE 1       - TOTAL SCORE: 5       - OVERALL GRADE: 1       - LYMPHOVASCULAR INVASION: NOT IDENTIFIED       - CANCER LENGTH: 0.6 CM       - CALCIFICATIONS: NOT IDENTIFIED   Estrogen Receptor:  100%, POSITIVE, STRONG STAINING INTENSITY Progesterone Receptor:  100%, POSITIVE, STRONG STAINING INTENSITY Proliferation Marker Ki67:  20%  GROUP 5:   HER2 **NEGATIVE**   RADIOGRAPHIC STUDIES: I have personally reviewed the radiological reports and agreed with the findings in the report.  ASSESSMENT AND PLAN:  Malignant neoplasm of upper-outer quadrant of left breast in female, estrogen receptor positive (HCC) This is a very pleasant 72 yr old female with newly diagnosed left breast IDC, ER 100% positive, PR 100% positive, Her 2 neg, Ki 67 20% referred to breast MDC for additional recommendations.  Given small tumor, ER positive, node negativity, we have discussed about oncotype followed by adjuvant radiation and antiestrogen therapy.  Assessment and Plan    Grade 1 Invasive Ductal Carcinoma T1cN0 (1.2 cm), estrogen and progesterone positive, HER2 negative, tumor at the 12 o'clock position. Patient is well-informed and at peace with the diagnosis. -Plan for  lumpectomy. -Perform Oncotype test on tumor specimen post-surgery to estimate risk of recurrence. -Post-surgery, consider radiation and antiestrogen therapy (e.g., Tamoxifen or Anastrozole) to reduce chance of recurrence. With regards to Tamoxifen, we discussed that this is a SERM, selective estrogen receptor modulator. We discussed mechanism of action of Tamoxifen, adverse effects on Tamoxifen including but not limited to post menopausal symptoms, increased risk of DVT/PE, increased risk of endometrial cancer, questionable cataracts with long term use and increased risk of cardiovascular events in the study which was not statistically significant. A benefit from Tamoxifen would be improvement in bone density. With regards to aromatase inhibitors, we discussed mechanism of action, adverse effects including but not limited to post menopausal symptoms, arthralgias, myalgias, increased risk of cardiovascular events and bone loss.   Obesity Patient acknowledges the link between obesity and cancer risk and expresses a strong desire to lose weight. -Encourage patient's plan for weight loss and exercise post-surgery.  Bone Density Patient has been advised to have a bone density test, which she plans to do post-surgery. -Encourage patient to follow through with bone density test post-surgery.  Follow-up Plan to meet with patient post-surgery to discuss final report and any additional treatment needed.             All questions were answered. The patient knows to call the clinic with any problems, questions or concerns.    [name], MD 07/21/23

## 2023-07-21 NOTE — Progress Notes (Signed)
Radiation Oncology         [phone] ________________________________  Multidisciplinary Breast Oncology Clinic [facility]) Initial Outpatient Consultation  Name: [name] MRN: [id number]  Date: 07/21/2023  DOB: 1951/09/07  CC:No primary care provider on file.  Barbara Bouillon, MD   REFERRING PHYSICIAN: Harriette Bouillon, MD  DIAGNOSIS: The encounter diagnosis was Malignant neoplasm of upper-outer quadrant of left breast in female, estrogen receptor positive (HCC).  Stage IA (cT1c, cN0, cM0) Left Breast UOQ, Invasive Ductal Carcinoma, ER+ / PR+ / Her2-, Grade 1    ICD-10-CM   1. Malignant neoplasm of upper-outer quadrant of left breast in female, estrogen receptor positive (HCC)  C50.412    Z17.0       HISTORY OF PRESENT ILLNESS::[name] is a 72 y.o. female who is presenting to the office today for evaluation of her newly diagnosed breast cancer. She is accompanied by her husband. She is doing well overall.   She had routine screening mammography on 07/07/23 showing a possible abnormality in the left breast. She underwent left breast diagnostic mammography with tomography and left breast ultrasonography at [facility] on 07/09/23 which revealed a 1.1-1.2 cm mass in the 12 o'clock left breast concerning for malignancy. No abnormal left axillary lymph nodes were appreciated.  Biopsy of the 12 o'clock left breast 6 cmfn, on 07/13/23 showed: grade 1 invasive ductal carcinoma measuring 6 mm in the greatest linear extent of the sample . Prognostic indicators significant for: estrogen receptor, 100% positive and progesterone receptor, 100% positive, both with strong staining intensity. Proliferation marker Ki67 at 20%. HER2 negative.  The patient was referred today for presentation in the multidisciplinary conference.  Radiology studies and pathology slides were presented there for review and discussion of treatment options.  A consensus was discussed regarding potential next  steps.  PREVIOUS RADIATION THERAPY: No  PAST MEDICAL HISTORY:  Past Medical History:  Diagnosis Date   Acute conjunctivitis of both eyes 04/05/2019   Allergy    Anxiety    Arthritis    Chicken pox    Colon polyp    Essential hypertension 10/03/2018   patient denies diagnosis of afib   Fibroids    Uterine   GERD (gastroesophageal reflux disease)    Heart murmur    History of colon polyps    Hyperlipidemia    Overweight     PAST SURGICAL HISTORY: Past Surgical History:  Procedure Laterality Date   implantable loop recorder placement  12/11/2019    Medtronic Reveal Linq model LNQ 2 ([id number]) implantable loop recorder   implantable loop recorder removal  01/25/2020   MDT Reveal LINQ removed in office due to patient request due to anxiety of having an implanted device,  removed without complication   NOSE SURGERY     ROTATOR CUFF REPAIR     TONSILLECTOMY  1970   WISDOM TOOTH EXTRACTION      FAMILY HISTORY:  Family History  Problem Relation Age of Onset   Hearing loss Mother    Macular degeneration Mother    Alzheimer's disease Father    Hypertension Father    Heart disease Father    Stomach cancer Maternal Grandmother    Ovarian cancer Maternal Grandmother    Emphysema Maternal Grandfather    Colon cancer Maternal Uncle    Lung cancer Sister    Heart disease Brother    Esophageal cancer Neg Hx    Rectal cancer Neg Hx    Breast cancer Neg Hx  SOCIAL HISTORY:  Social History   Socioeconomic History   Marital status: Single    Spouse name: Not on file   Number of children: Not on file   Years of education: Not on file   Highest education level: Not on file  Occupational History   Not on file  Tobacco Use   Smoking status: Former    Types: Cigarettes   Smokeless tobacco: Never   Tobacco comments:    smoked off and on for about 25 years  Vaping Use   Vaping status: Never Used  Substance and Sexual Activity   Alcohol use: Yes    Alcohol/week: 0.0  standard drinks of alcohol    Comment: seldom   Drug use: No   Sexual activity: Yes    Partners: Male  Other Topics Concern   Not on file  Social History Narrative   Marital status/children/pets: Single.    Education/employment: retired Photographer:      -smoke alarm in the home:Yes     - wears seatbelt: Yes     - Feels safe in their relationships: Yes   Social Determinants of Health   Financial Resource Strain: Low Risk  (01/13/2023)   Received from [facility], [facility]   Overall Financial Resource Strain (CARDIA)    Difficulty of Paying Living Expenses: Not hard at all  Food Insecurity: No Food Insecurity (11/04/2022)   Received from [facility], [facility]   Hunger Vital Sign    Worried About Running Out of Food in the Last Year: Never true    Ran Out of Food in the Last Year: Never true  Transportation Needs: No Transportation Needs (11/04/2022)   Received from [facility], [facility]   PRAPARE - Transportation    Lack of Transportation (Medical): No    Lack of Transportation (Non-Medical): No  Physical Activity: Unknown (11/04/2022)   Received from [facility]   Exercise Vital Sign    Days of Exercise per Week: 3 days    Minutes of Exercise per Session: Not on file  Recent Concern: Physical Activity - Insufficiently Active (11/04/2022)   Received from [facility]   Exercise Vital Sign    Days of Exercise per Week: 3 days    Minutes of Exercise per Session: 30 min  Stress: No Stress Concern Present (01/13/2023)   Received from [facility] of Occupational Health - Occupational Stress Questionnaire    Feeling of Stress : Only a little  Recent Concern: Stress - Stress Concern Present (11/04/2022)   Received from [facility] of Occupational Health - Occupational Stress Questionnaire    Feeling of Stress : To some extent  Social Connections: Socially Integrated (11/04/2022)    Received from [facility], [facility]   Social Network    How would you rate your social network (family, work, friends)?: Good participation with social networks    ALLERGIES: No Known Allergies  MEDICATIONS:  Current Outpatient Medications  Medication Sig Dispense Refill   Ascorbic Acid (VITAMIN C PO) Take 1 tablet by mouth daily. (Patient not taking: Reported on 07/09/2021)     Cholecalciferol (VITAMIN D3) 10 MCG (400 UNIT) CAPS Take 1 tablet by mouth daily. (Patient not taking: Reported on 07/09/2021)     Cyanocobalamin (VITAMIN B 12 PO) Take 1 tablet by mouth daily. (Patient not taking: Reported on 07/09/2021)     diltiazem (CARDIZEM) 30 MG tablet Cardizem 30mg  -- take  1 tablet every 4 hours AS NEEDED for heart rate >100 30 tablet 1   fluticasone (FLONASE) 50 MCG/ACT nasal spray Place 2 sprays into both nostrils daily. (Patient not taking: Reported on 07/09/2021) 16 g 6   Multiple Vitamin (MULTIVITAMIN) tablet Take 1 tablet by mouth daily.     Omega 3 1000 MG CAPS Take 1 capsule by mouth daily. (Patient not taking: Reported on 07/09/2021)     sulfacetamide (BLEPH-10) 10 % ophthalmic solution Place 1 drop into both eyes as needed.     No current facility-administered medications for this encounter.    REVIEW OF SYSTEMS: A 10+ POINT REVIEW OF SYSTEMS WAS OBTAINED including neurology, dermatology, psychiatry, cardiac, respiratory, lymph, extremities, GI, GU, musculoskeletal, constitutional, reproductive, HEENT.  Prior to biopsy she denied any pain within the breast area nipple discharge or bleeding.   PHYSICAL EXAM:     07/21/2023  Vitals with BMI   Height 5\' 6"    Weight 217 lbs 2 oz   BMI 35.06   Systolic 151 !   Diastolic 79 !   Pulse 67     Legend: ! Abnormal  Lungs are clear to auscultation bilaterally. Heart has regular rate and rhythm. No palpable cervical, supraclavicular, or axillary adenopathy. Abdomen soft, non-tender, normal bowel sounds. Breast: Right breast with no  palpable mass, nipple discharge, or bleeding. Left breast with biopsy site in the 1 o'clock left breast, with a small area of induration deep within the breast noted.  No nipple discharge or bleeding appreciated.   KPS = 100  100 - Normal; no complaints; no evidence of disease. 90   - Able to carry on normal activity; minor signs or symptoms of disease. 80   - Normal activity with effort; some signs or symptoms of disease. 37   - Cares for self; unable to carry on normal activity or to do active work. 60   - Requires occasional assistance, but is able to care for most of his personal needs. 50   - Requires considerable assistance and frequent medical care. 40   - Disabled; requires special care and assistance. 30   - Severely disabled; hospital admission is indicated although death not imminent. 20   - Very sick; hospital admission necessary; active supportive treatment necessary. 10   - Moribund; fatal processes progressing rapidly. 0     - Dead  Karnofsky DA, Abelmann WH, Craver LS and Burchenal [facility] [phone]) The use of the nitrogen mustards in the palliative treatment of carcinoma: with particular reference to bronchogenic carcinoma Cancer 1 634-56  LABORATORY DATA:  Lab Results  Component Value Date   WBC 8.8 07/21/2023   HGB 14.2 07/21/2023   HCT 42.2 07/21/2023   MCV 93.2 07/21/2023   PLT 251 07/21/2023   Lab Results  Component Value Date   NA 140 07/21/2023   K 4.3 07/21/2023   CL 108 07/21/2023   CO2 29 07/21/2023   Lab Results  Component Value Date   ALT 18 07/21/2023   AST 16 07/21/2023   ALKPHOS 74 07/21/2023   BILITOT 0.4 07/21/2023    PULMONARY FUNCTION TEST:   Review Flowsheet        No data to display          RADIOGRAPHY: No results found.    IMPRESSION: Stage IA (cT1c, cN0, cM0) Left Breast UOQ, Invasive Ductal Carcinoma, ER+ / PR+ / Her2-, Grade 1   Patient will be a good candidate for breast conservation with radiotherapy to the  left breast. We  discussed the general course of radiation, potential side effects, and toxicities with radiation and the patient is interested in this approach.   PLAN:  Genetics   Left lumpectomy  Oncotype  Adjuvant radiation therapy  Aromatase inhibitor    ------------------------------------------------  Billie Lade, PhD, MD  This document serves as a record of services personally performed by Antony Blackbird, MD. It was created on his behalf by Neena Rhymes, a trained medical scribe. The creation of this record is based on the scribe's personal observations and the provider's statements to them. This document has been checked and approved by the attending provider.

## 2023-07-21 NOTE — Assessment & Plan Note (Addendum)
This is a very pleasant 72 yr old female with newly diagnosed left breast IDC, ER 100% positive, PR 100% positive, Her 2 neg, Ki 67 20% referred to breast MDC for additional recommendations.  Given small tumor, ER positive, node negativity, we have discussed about oncotype followed by adjuvant radiation and antiestrogen therapy.  Assessment and Plan    Grade 1 Invasive Ductal Carcinoma T1cN0 (1.2 cm), estrogen and progesterone positive, HER2 negative, tumor at the 12 o'clock position. Patient is well-informed and at peace with the diagnosis. -Plan for lumpectomy. -Perform Oncotype test on tumor specimen post-surgery to estimate risk of recurrence. -Post-surgery, consider radiation and antiestrogen therapy (e.g., Tamoxifen or Anastrozole) to reduce chance of recurrence. With regards to Tamoxifen, we discussed that this is a SERM, selective estrogen receptor modulator. We discussed mechanism of action of Tamoxifen, adverse effects on Tamoxifen including but not limited to post menopausal symptoms, increased risk of DVT/PE, increased risk of endometrial cancer, questionable cataracts with long term use and increased risk of cardiovascular events in the study which was not statistically significant. A benefit from Tamoxifen would be improvement in bone density. With regards to aromatase inhibitors, we discussed mechanism of action, adverse effects including but not limited to post menopausal symptoms, arthralgias, myalgias, increased risk of cardiovascular events and bone loss.   Obesity Patient acknowledges the link between obesity and cancer risk and expresses a strong desire to lose weight. -Encourage patient's plan for weight loss and exercise post-surgery.  Bone Density Patient has been advised to have a bone density test, which she plans to do post-surgery. -Encourage patient to follow through with bone density test post-surgery.  Follow-up Plan to meet with patient post-surgery to discuss  final report and any additional treatment needed.

## 2023-07-21 NOTE — Progress Notes (Signed)
REFERRING PROVIDER: [name], MD  PRIMARY PROVIDER:  No primary care provider on file.  PRIMARY REASON FOR VISIT:  1. Malignant neoplasm of upper-outer quadrant of left breast in female, estrogen receptor positive (HCC)   2. Family history of ovarian cancer   3. Family history of colon cancer   4. Family history of prostate cancer    HISTORY OF PRESENT ILLNESS:   [name], a 72 y.o. female, was seen for a [place] cancer genetics consultation at the request of Dr. Al Pimple due to a personal and family history of cancer.  Ms. Windon presents to clinic today to discuss the possibility of a hereditary predisposition to cancer, to discuss genetic testing, and to further clarify her future cancer risks, as well as potential cancer risks for family members.   In September 2024, at the age of 15, Ms. Langer was diagnosed with invasive ductal carcinoma of the left breast (ER/PR positive, HER2 negative).  CANCER HISTORY:  Oncology History  Malignant neoplasm of upper-outer quadrant of left breast in female, estrogen receptor positive (HCC)  07/09/2023 Mammogram   Patient had screening mammogram and diagnostic mammogram which confirmed a 1.1 x 1 x 0.8 cm irregular mass in the left breast at 12:00 middle depth 6 cm from the nipple, irregular mass is hypoechoic.  Ultrasound-guided biopsy is recommended.  No significant abnormalities were seen sonographically in the left axilla.   07/13/2023 Pathology Results   Left breast needle core biopsy showed overall grade 1 invasive ductal carcinoma prognostics indicated ER 100% positive strong staining, PR 100% positive strong staining, Ki-67 of 20% and group 5 HER2 negative   07/19/2023 Initial Diagnosis   Malignant neoplasm of upper-outer quadrant of left breast in female, estrogen receptor positive (HCC)   07/21/2023 Cancer Staging   Staging form: Breast, AJCC 8th Edition - Clinical stage from 07/21/2023: Stage IA (cT1c, cN0, cM0, G1, ER+, PR+, HER2-) -  Signed by [name], MD on 07/21/2023 Stage prefix: Initial diagnosis Histologic grading system: 3 grade system Laterality: Left Staged by: Pathologist and managing physician Stage used in treatment planning: Yes National guidelines used in treatment planning: Yes     Past Medical History:  Diagnosis Date   Acute conjunctivitis of both eyes 04/05/2019   Allergy    Anxiety    Arthritis    Chicken pox    Colon polyp    Essential hypertension 10/03/2018   patient denies diagnosis of afib   Fibroids    Uterine   GERD (gastroesophageal reflux disease)    Heart murmur    History of colon polyps    Hyperlipidemia    Overweight     Past Surgical History:  Procedure Laterality Date   implantable loop recorder placement  12/11/2019    Medtronic Reveal Linq model LNQ 2 ([id number]) implantable loop recorder   implantable loop recorder removal  01/25/2020   MDT Reveal LINQ removed in office due to patient request due to anxiety of having an implanted device,  removed without complication   NOSE SURGERY     ROTATOR CUFF REPAIR     TONSILLECTOMY  1970   WISDOM TOOTH EXTRACTION      Social History   Socioeconomic History   Marital status: Single    Spouse name: Not on file   Number of children: Not on file   Years of education: Not on file   Highest education level: Not on file  Occupational History   Not on file  Tobacco Use  Smoking status: Former    Types: Cigarettes   Smokeless tobacco: Never   Tobacco comments:    smoked off and on for about 25 years  Vaping Use   Vaping status: Never Used  Substance and Sexual Activity   Alcohol use: Yes    Alcohol/week: 0.0 standard drinks of alcohol    Comment: seldom   Drug use: No   Sexual activity: Yes    Partners: Male  Other Topics Concern   Not on file  Social History Narrative   Marital status/children/pets: Single.    Education/employment: retired Photographer:      -smoke alarm in the  home:Yes     - wears seatbelt: Yes     - Feels safe in their relationships: Yes   Social Determinants of Health   Financial Resource Strain: Low Risk  (01/13/2023)   Received from [facility]   Overall Financial Resource Strain (CARDIA)    Difficulty of Paying Living Expenses: Not hard at all  Food Insecurity: No Food Insecurity (11/04/2022)   Received from [facility]   Hunger Vital Sign    Worried About Running Out of Food in the Last Year: Never true    Ran Out of Food in the Last Year: Never true  Transportation Needs: No Transportation Needs (11/04/2022)   Received from [facility]   PRAPARE - Transportation    Lack of Transportation (Medical): No    Lack of Transportation (Non-Medical): No  Physical Activity: Unknown (11/04/2022)   Received from [facility]   Exercise Vital Sign    Days of Exercise per Week: 3 days    Minutes of Exercise per Session: Not on file  Recent Concern: Physical Activity - Insufficiently Active (11/04/2022)   Received from [facility]   Exercise Vital Sign    Days of Exercise per Week: 3 days    Minutes of Exercise per Session: 30 min  Stress: No Stress Concern Present (01/13/2023)   Received from [facility], [facility] of Occupational Health - Occupational Stress Questionnaire    Feeling of Stress : Only a little  Recent Concern: Stress - Stress Concern Present (11/04/2022)   Received from [facility] of Occupational Health - Occupational Stress Questionnaire    Feeling of Stress : To some extent  Social Connections: Socially Integrated (11/04/2022)   Received from [facility], [facility]   Social Network    How would you rate your social network (family, work, friends)?: Good participation with social networks     FAMILY HISTORY:  We obtained a detailed, 4-generation family history.  Significant diagnoses are listed below: Family History   Problem Relation Age of Onset   Lung cancer Sister 51       smoked   Colon cancer Maternal Uncle 23   Ovarian cancer Maternal Grandmother 76   Prostate cancer Paternal Grandfather      Ms. Meraz's sister was diagnosed with lung cancer at age 52, she smoked and died at age 49. Her maternal uncle was diagnosed with colon cancer at age 35, he died at age 42. Her maternal grandmother was diagnosed with ovarian cancer at age 63, she died at age 80. Ms. Lindenmeyer paternal grandfather was diagnosed with prostate cancer, he is deceased. Ms. Mcalpin is unaware of previous family history of genetic testing for hereditary cancer risks. There is no reported Ashkenazi Jewish ancestry.   GENETIC COUNSELING  ASSESSMENT: Ms. Cage is a 72 y.o. female with a personal and family history of cancer which is somewhat suggestive of a hereditary predisposition to cancer. We, therefore, discussed and recommended the following at today's visit.   DISCUSSION: We discussed that 5 - 10% of cancer is hereditary, with most cases of breast cancer associated with BRCA1/2.  There are other genes that can be associated with hereditary breast cancer syndromes.  We discussed that testing is beneficial for several reasons including knowing how to follow individuals after completing their treatment, identifying whether potential treatment options would be beneficial, and understanding if other family members could be at risk for cancer and allowing them to undergo genetic testing.   We reviewed the characteristics, features and inheritance patterns of hereditary cancer syndromes. We also discussed genetic testing, including the appropriate family members to test, the process of testing, insurance coverage and turn-around-time for results. We discussed the implications of a negative, positive, carrier and/or variant of uncertain significant result.   Ms. Demsky elected to have [facility] CancerNext Panel. The CancerNext gene panel offered by  [employer] includes sequencing, rearrangement analysis, and RNA analysis for the following 34 genes:   APC, ATM, AXIN2, BARD1, BMPR1A, BRCA1, BRCA2, BRIP1, CDH1, CDK4, CDKN2A, CHEK2, DICER1, HOXB13, EPCAM, GREM1, MLH1, MSH2, MSH3, MSH6, MUTYH, NF1, NTHL1, PALB2, PMS2, POLD1, POLE, PTEN, RAD51C, RAD51D, SMAD4, SMARCA4, STK11, and TP53.   Based on Ms. Both's personal and family history of cancer, she meets medical criteria for genetic testing. Despite that she meets criteria, she may still have an out of pocket cost. We discussed that if her out of pocket cost for testing is over $100, the laboratory should contact them to discuss self-pay prices, patient pay assistance programs, if applicable, and other billing options.  PLAN: After considering the risks, benefits, and limitations, Ms. Cravener provided informed consent to pursue genetic testing and the blood sample was sent to [facility] for analysis of the CancerNext Panel. Results should be available within approximately 2-3 weeks' time, at which point they will be disclosed by telephone to Ms. Adriano, as will any additional recommendations warranted by these results. Ms. Jutras will receive a summary of her genetic counseling visit and a copy of her results once available. This information will also be available in Epic.   Ms. Bergkamp questions were answered to her satisfaction today. Our contact information was provided should additional questions or concerns arise. Thank you for the referral and allowing [place] to share in the care of your patient.   Lalla Brothers, MS, [facility] Genetic Counselor [email or web address] (P) [phone]  The patient was seen for a total of 20 minutes in face-to-face genetic counseling.  The patient brought her fiancee. Drs. Pamelia Hoit and/or Mosetta Putt were available to discuss this case as needed.   _______________________________________________________________________ For Office Staff:  Number of people involved  in session: 2 Was an Intern/ student involved with case: no

## 2023-07-29 ENCOUNTER — Telehealth: Payer: Self-pay | Admitting: *Deleted

## 2023-07-29 ENCOUNTER — Inpatient Hospital Stay: Payer: Medicare HMO

## 2023-07-29 ENCOUNTER — Encounter: Payer: Self-pay | Admitting: *Deleted

## 2023-07-29 NOTE — Telephone Encounter (Signed)
Spoke with patient to follow up from [facility] 9/18 and assess navigation needs.  Patient denies any questions or concerns at this time. Encouraged her to call should anything arises. Patient verbalized understanding.

## 2023-07-29 NOTE — Progress Notes (Signed)
[facility] Clinical Social Work  Clinical Social Work was referred by new patient protocol for assessment of psychosocial needs.  Clinical Social Worker contacted patient by phone to offer support and assess for needs.  CSW reviewed SDOH Screenings, no expressed needs at this time. CSW introdueced support services, will send email with digital flyers. Patient has direct contact if needed.  No follow up at this time.  Marguerita Merles, LCSWA Clinical Social Worker [facility]

## 2023-07-30 ENCOUNTER — Inpatient Hospital Stay: Payer: Medicare HMO | Admitting: General Practice

## 2023-07-30 ENCOUNTER — Encounter: Payer: Self-pay | Admitting: Genetic Counselor

## 2023-07-30 ENCOUNTER — Telehealth: Payer: Self-pay | Admitting: Genetic Counselor

## 2023-07-30 DIAGNOSIS — Z1379 Encounter for other screening for genetic and chromosomal anomalies: Secondary | ICD-10-CM | POA: Insufficient documentation

## 2023-07-30 NOTE — Telephone Encounter (Signed)
I contacted Ms. Sundberg to discuss her genetic testing results. No pathogenic variants were identified in the 34 genes analyzed. Of note, a variant of uncertain significance was identified in the MSH2 gene. Detailed clinic note to follow.  The test report has been scanned into EPIC and is located under the Molecular Pathology section of the Results Review tab.  A portion of the result report is included below for reference.   Lalla Brothers, MS, [facility] Genetic Counselor [email or web address] (P) [phone]

## 2023-08-02 ENCOUNTER — Encounter (HOSPITAL_BASED_OUTPATIENT_CLINIC_OR_DEPARTMENT_OTHER): Payer: Self-pay | Admitting: Surgery

## 2023-08-02 NOTE — Progress Notes (Signed)
   08/02/23 1107  PAT Phone Screen  Is the patient taking a GLP-1 receptor agonist? No  Do You Have Diabetes? No  Do You Have Hypertension? No  Have You Ever Been to the ER for Asthma? No  Have You Taken Oral Steroids in the Past 3 Months? No  Do you Take Phenteramine or any Other Diet Drugs? No  Recent  Lab Work, EKG, CXR? Yes  Where was this test performed? [facility]  Do you have a history of heart problems? Yes  Cardiologist Name Dr. Chales Abrahams  Have you ever had tests on your heart? Yes  What cardiac tests were performed? Echo;EKG  What date/year were cardiac tests completed? Ablation 01/2023, Echo 11/25/2022 EF 55-60%, EKG 01/11/2023  Results viewable: (S)  Care Everywhere;[facility] Media Tab  Any Recent Hospitalizations? No  Height 5\' 6"  (1.676 m)  Weight 98 kg  Pat Appointment Scheduled (S)  Yes (ERAS and soap)   Dr. Nance Pew reviewed cardiology results from January and March of 2024.  No further cardiac testing needed before surgery needed. Ok for surgery scheduled for 08/10/2023.

## 2023-08-05 ENCOUNTER — Encounter: Payer: Self-pay | Admitting: Genetic Counselor

## 2023-08-05 ENCOUNTER — Ambulatory Visit: Payer: Self-pay | Admitting: Genetic Counselor

## 2023-08-05 DIAGNOSIS — Z1379 Encounter for other screening for genetic and chromosomal anomalies: Secondary | ICD-10-CM

## 2023-08-05 NOTE — Progress Notes (Signed)
HPI:   [name] was previously seen in the [facility] due to a personal and family history of cancer and concerns regarding a hereditary predisposition to cancer. Please refer to our prior cancer genetics clinic note for more information regarding our discussion, assessment and recommendations, at the time. [name] recent genetic test results were disclosed to her, as were recommendations warranted by these results. These results and recommendations are discussed in more detail below.  CANCER HISTORY:  Oncology History  Malignant neoplasm of upper-outer quadrant of left breast in female, estrogen receptor positive (HCC)  07/09/2023 Mammogram   Patient had screening mammogram and diagnostic mammogram which confirmed a 1.1 x 1 x 0.8 cm irregular mass in the left breast at 12:00 middle depth 6 cm from the nipple, irregular mass is hypoechoic.  Ultrasound-guided biopsy is recommended.  No significant abnormalities were seen sonographically in the left axilla.   07/13/2023 Pathology Results   Left breast needle core biopsy showed overall grade 1 invasive ductal carcinoma prognostics indicated ER 100% positive strong staining, PR 100% positive strong staining, Ki-67 of 20% and group 5 HER2 negative   07/19/2023 Initial Diagnosis   Malignant neoplasm of upper-outer quadrant of left breast in female, estrogen receptor positive (HCC)   07/21/2023 Cancer Staging   Staging form: Breast, AJCC 8th Edition - Clinical stage from 07/21/2023: Stage IA (cT1c, cN0, cM0, G1, ER+, PR+, HER2-) - Signed by [name], MD on 07/21/2023 Stage prefix: Initial diagnosis Histologic grading system: 3 grade system Laterality: Left Staged by: Pathologist and managing physician Stage used in treatment planning: Yes National guidelines used in treatment planning: Yes    Genetic Testing   [facility] CancerNext Panel+RNA was Negative. Of note, a variant of uncertain significance was identified in the  MSH2 gene (p.Q374H). Report date is 07/30/2023.  The CancerNext gene panel offered by [employer] includes sequencing, rearrangement analysis, and RNA analysis for the following 34 genes:   APC, ATM, AXIN2, BARD1, BMPR1A, BRCA1, BRCA2, BRIP1, CDH1, CDK4, CDKN2A, CHEK2, DICER1, HOXB13, EPCAM, GREM1, MLH1, MSH2, MSH3, MSH6, MUTYH, NF1, NTHL1, PALB2, PMS2, POLD1, POLE, PTEN, RAD51C, RAD51D, SMAD4, SMARCA4, STK11, and TP53.      FAMILY HISTORY:  We obtained a detailed, 4-generation family history.  Significant diagnoses are listed below:      Family History  Problem Relation Age of Onset   Lung cancer Sister 74        smoked   Colon cancer Maternal Uncle 24   Ovarian cancer Maternal Grandmother 24   Prostate cancer Paternal Grandfather         [name] sister was diagnosed with lung cancer at age 48, she smoked and died at age 23. Her maternal uncle was diagnosed with colon cancer at age 45, he died at age 71. Her maternal grandmother was diagnosed with ovarian cancer at age 70, she died at age 64. [name] paternal grandfather was diagnosed with prostate cancer, he is deceased. [name] is unaware of previous family history of genetic testing for hereditary cancer risks. There is no reported Ashkenazi Jewish ancestry.    GENETIC TEST RESULTS:  The [facility] CancerNext Panel found no pathogenic mutations.  The CancerNext gene panel offered by [employer] includes sequencing, rearrangement analysis, and RNA analysis for the following 34 genes:   APC, ATM, AXIN2, BARD1, BMPR1A, BRCA1, BRCA2, BRIP1, CDH1, CDK4, CDKN2A, CHEK2, DICER1, HOXB13, EPCAM, GREM1, MLH1, MSH2, MSH3, MSH6, MUTYH, NF1, NTHL1, PALB2, PMS2, POLD1, POLE, PTEN, RAD51C, RAD51D,  SMAD4, SMARCA4, STK11, and TP53.   The test report has been scanned into EPIC and is located under the Molecular Pathology section of the Results Review tab.  A portion of the result report is included below for reference. Genetic testing reported  out on 07/30/2023.       Genetic testing identified a variant of uncertain significance (VUS) in the MSH2 gene called p.Q374H.  At this time, it is unknown if this variant is associated with an increased risk for cancer or if it is benign, but most uncertain variants are reclassified to benign. It should not be used to make medical management decisions. With time, we suspect the laboratory will determine the significance of this variant, if any. If the laboratory reclassifies this variant, we will attempt to contact [name] to discuss it further.   Even though a pathogenic variant was not identified, possible explanations for the cancer in the family may include: There may be no hereditary risk for cancer in the family. The cancers in [name] and/or her family may be due to other genetic or environmental factors. There may be a gene mutation in one of these genes that current testing methods cannot detect, but that chance is small. There could be another gene that has not yet been discovered, or that we have not yet tested, that is responsible for the cancer diagnoses in the family.  It is also possible there is a hereditary cause for the cancer in the family that [name] did not inherit. The variant of uncertain significance detected in the MSH2 gene may be reclassified as a pathogenic variant in the future. At this time, we do not know if this variant increases the risk for cancer.  Therefore, it is important to remain in touch with cancer genetics in the future so that we can continue to offer [name] the most up to date genetic testing.   ADDITIONAL GENETIC TESTING:  We discussed with [name] that her genetic testing was fairly extensive.  If there are genes identified to increase cancer risk that can be analyzed in the future, we would be happy to discuss and coordinate this testing at that time.    CANCER SCREENING RECOMMENDATIONS:  [name] test result is considered negative  (normal).  This means that we have not identified a hereditary cause for her personal and family history of cancer at this time.   An individual's cancer risk and medical management are not determined by genetic test results alone. Overall cancer risk assessment incorporates additional factors, including personal medical history, family history, and any available genetic information that may result in a personalized plan for cancer prevention and surveillance. Therefore, it is recommended she continue to follow the cancer management and screening guidelines provided by her oncology and primary healthcare provider.  RECOMMENDATIONS FOR FAMILY MEMBERS:   Since she did not inherit a mutation in a cancer predisposition gene included on this panel, her daughter could not have inherited a mutation from her in one of these genes. Individuals in this family might be at some increased risk of developing cancer, over the general population risk, due to the family history of cancer. We recommend women in this family have a yearly mammogram beginning at age 31, or 49 years younger than the earliest onset of cancer, an annual clinical breast exam, and perform monthly breast self-exams.  Other members of the family may still carry a pathogenic variant in one of these genes that [name] did  not inherit. Based on the family history, we recommend her mother have genetic counseling and testing.  We do not recommend familial testing for the MSH2 variant of uncertain significance (VUS).  FOLLOW-UP:  Cancer genetics is a rapidly advancing field and it is possible that new genetic tests will be appropriate for her and/or her family members in the future. We encouraged her to remain in contact with cancer genetics on an annual basis so we can update her personal and family histories and let her know of advances in cancer genetics that may benefit this family.   Our contact number was provided. [name] questions were  answered to her satisfaction, and she knows she is welcome to call us at anytime with additional questions or concerns.   Lalla Brothers, MS, [facility] Genetic Counselor [email or web address] (P) [phone]

## 2023-08-09 ENCOUNTER — Encounter (HOSPITAL_BASED_OUTPATIENT_CLINIC_OR_DEPARTMENT_OTHER)
Admission: RE | Admit: 2023-08-09 | Discharge: 2023-08-09 | Disposition: A | Discharge status: Home / Self Care | Payer: Medicare HMO | Source: Ambulatory Visit | Attending: Surgery | Admitting: Surgery

## 2023-08-09 MED ORDER — CHLORHEXIDINE GLUCONATE CLOTH 2 % EX PADS: 6.0000 | MEDICATED_PAD | Freq: Once | CUTANEOUS | Status: DC

## 2023-08-09 NOTE — Progress Notes (Signed)
Patient given presurgical drink and presurgical soap. Education provided and patient verbalized understanding.

## 2023-08-10 ENCOUNTER — Ambulatory Visit (HOSPITAL_BASED_OUTPATIENT_CLINIC_OR_DEPARTMENT_OTHER)
Admission: RE | Admit: 2023-08-10 | Discharge: 2023-08-10 | Disposition: A | Discharge status: Home / Self Care | Payer: Medicare HMO | Attending: Surgery | Admitting: Surgery

## 2023-08-10 ENCOUNTER — Other Ambulatory Visit: Payer: Self-pay

## 2023-08-10 ENCOUNTER — Encounter (HOSPITAL_BASED_OUTPATIENT_CLINIC_OR_DEPARTMENT_OTHER): Payer: Self-pay | Admitting: Surgery

## 2023-08-10 ENCOUNTER — Ambulatory Visit (HOSPITAL_BASED_OUTPATIENT_CLINIC_OR_DEPARTMENT_OTHER): Payer: Medicare HMO | Admitting: Anesthesiology

## 2023-08-10 ENCOUNTER — Encounter (HOSPITAL_BASED_OUTPATIENT_CLINIC_OR_DEPARTMENT_OTHER)
Admission: RE | Disposition: A | Discharge status: Home / Self Care | Payer: Self-pay | Source: Home / Self Care | Attending: Surgery

## 2023-08-10 DIAGNOSIS — I480 Paroxysmal atrial fibrillation: Secondary | ICD-10-CM

## 2023-08-10 DIAGNOSIS — Z87891 Personal history of nicotine dependence: Secondary | ICD-10-CM | POA: Insufficient documentation

## 2023-08-10 DIAGNOSIS — Z1721 Progesterone receptor positive status: Secondary | ICD-10-CM | POA: Insufficient documentation

## 2023-08-10 DIAGNOSIS — N6321 Unspecified lump in the left breast, upper outer quadrant: Secondary | ICD-10-CM | POA: Diagnosis not present

## 2023-08-10 DIAGNOSIS — C50412 Malignant neoplasm of upper-outer quadrant of left female breast: Secondary | ICD-10-CM | POA: Diagnosis present

## 2023-08-10 DIAGNOSIS — Z419 Encounter for procedure for purposes other than remedying health state, unspecified: Secondary | ICD-10-CM

## 2023-08-10 DIAGNOSIS — C50912 Malignant neoplasm of unspecified site of left female breast: Secondary | ICD-10-CM

## 2023-08-10 DIAGNOSIS — F419 Anxiety disorder, unspecified: Secondary | ICD-10-CM | POA: Diagnosis not present

## 2023-08-10 DIAGNOSIS — Z170 Estrogen receptor positive status [ER+]: Secondary | ICD-10-CM | POA: Insufficient documentation

## 2023-08-10 HISTORY — PX: BREAST LUMPECTOMY WITH RADIOACTIVE SEED LOCALIZATION: SHX6424

## 2023-08-10 HISTORY — DX: Unspecified atrial fibrillation: I48.91

## 2023-08-10 SURGERY — BREAST LUMPECTOMY WITH RADIOACTIVE SEED LOCALIZATION
Anesthesia: General | Site: Breast | Laterality: Left

## 2023-08-10 MED ORDER — SODIUM CHLORIDE 0.9 % IV SOLN
INTRAVENOUS | Status: DC | PRN
  Administered 2023-08-10: 500 mL

## 2023-08-10 MED ORDER — ONDANSETRON HCL 4 MG/2ML IJ SOLN
INTRAMUSCULAR | Status: AC
  Filled 2023-08-10: qty 2

## 2023-08-10 MED ORDER — CEFAZOLIN SODIUM-DEXTROSE 2-4 GM/100ML-% IV SOLN
INTRAVENOUS | Status: AC
  Filled 2023-08-10: qty 100

## 2023-08-10 MED ORDER — LIDOCAINE 2% (20 MG/ML) 5 ML SYRINGE
INTRAMUSCULAR | Status: DC | PRN
  Administered 2023-08-10: 60 mg via INTRAVENOUS

## 2023-08-10 MED ORDER — CEFAZOLIN SODIUM-DEXTROSE 2-4 GM/100ML-% IV SOLN
2.0000 g | INTRAVENOUS | Status: AC
  Administered 2023-08-10: 2 g via INTRAVENOUS

## 2023-08-10 MED ORDER — KETOROLAC TROMETHAMINE 15 MG/ML IJ SOLN: 15.0000 mg | Freq: Once | INTRAMUSCULAR | Status: DC | PRN

## 2023-08-10 MED ORDER — OXYCODONE HCL 5 MG PO TABS: 5.0000 mg | ORAL_TABLET | Freq: Four times a day (QID) | ORAL | 0 refills | Status: DC | PRN

## 2023-08-10 MED ORDER — AMISULPRIDE (ANTIEMETIC) 5 MG/2ML IV SOLN: 10.0000 mg | Freq: Once | INTRAVENOUS | Status: DC | PRN

## 2023-08-10 MED ORDER — FENTANYL CITRATE (PF) 100 MCG/2ML IJ SOLN
INTRAMUSCULAR | Status: AC
  Filled 2023-08-10: qty 2

## 2023-08-10 MED ORDER — SODIUM CHLORIDE 0.9 % IV SOLN
INTRAVENOUS | Status: AC
  Filled 2023-08-10: qty 10

## 2023-08-10 MED ORDER — MIDAZOLAM HCL 2 MG/2ML IJ SOLN
INTRAMUSCULAR | Status: AC
  Filled 2023-08-10: qty 2

## 2023-08-10 MED ORDER — FENTANYL CITRATE (PF) 100 MCG/2ML IJ SOLN
25.0000 ug | INTRAMUSCULAR | Status: DC | PRN
  Administered 2023-08-10 (×2): 50 ug via INTRAVENOUS

## 2023-08-10 MED ORDER — ACETAMINOPHEN 500 MG PO TABS
ORAL_TABLET | ORAL | Status: AC
  Filled 2023-08-10: qty 2

## 2023-08-10 MED ORDER — BUPIVACAINE-EPINEPHRINE (PF) 0.25% -1:200000 IJ SOLN
INTRAMUSCULAR | Status: AC
  Filled 2023-08-10: qty 30

## 2023-08-10 MED ORDER — PROPOFOL 10 MG/ML IV BOLUS
INTRAVENOUS | Status: DC | PRN
  Administered 2023-08-10: 150 mg via INTRAVENOUS

## 2023-08-10 MED ORDER — MIDAZOLAM HCL 5 MG/5ML IJ SOLN
INTRAMUSCULAR | Status: DC | PRN
  Administered 2023-08-10: 2 mg via INTRAVENOUS

## 2023-08-10 MED ORDER — DEXAMETHASONE SODIUM PHOSPHATE 10 MG/ML IJ SOLN
INTRAMUSCULAR | Status: AC
  Filled 2023-08-10: qty 1

## 2023-08-10 MED ORDER — ONDANSETRON HCL 4 MG/2ML IJ SOLN
INTRAMUSCULAR | Status: DC | PRN
  Administered 2023-08-10: 4 mg via INTRAVENOUS

## 2023-08-10 MED ORDER — ACETAMINOPHEN 500 MG PO TABS
1000.0000 mg | ORAL_TABLET | Freq: Once | ORAL | Status: AC
  Administered 2023-08-10: 1000 mg via ORAL

## 2023-08-10 MED ORDER — OXYCODONE HCL 5 MG PO TABS
5.0000 mg | ORAL_TABLET | Freq: Once | ORAL | Status: AC
  Administered 2023-08-10: 5 mg via ORAL

## 2023-08-10 MED ORDER — FENTANYL CITRATE (PF) 250 MCG/5ML IJ SOLN
INTRAMUSCULAR | Status: DC | PRN
  Administered 2023-08-10 (×2): 50 ug via INTRAVENOUS

## 2023-08-10 MED ORDER — DEXAMETHASONE SODIUM PHOSPHATE 10 MG/ML IJ SOLN
INTRAMUSCULAR | Status: DC | PRN
  Administered 2023-08-10: 8 mg via INTRAVENOUS

## 2023-08-10 MED ORDER — ONDANSETRON HCL 4 MG/2ML IJ SOLN: 4.0000 mg | Freq: Once | INTRAMUSCULAR | Status: DC | PRN

## 2023-08-10 MED ORDER — LIDOCAINE 2% (20 MG/ML) 5 ML SYRINGE
INTRAMUSCULAR | Status: AC
  Filled 2023-08-10: qty 5

## 2023-08-10 MED ORDER — OXYCODONE HCL 5 MG PO TABS
ORAL_TABLET | ORAL | Status: AC
  Filled 2023-08-10: qty 1

## 2023-08-10 MED ORDER — EPHEDRINE SULFATE-NACL 50-0.9 MG/10ML-% IV SOSY
PREFILLED_SYRINGE | INTRAVENOUS | Status: DC | PRN
  Administered 2023-08-10 (×2): 10 mg via INTRAVENOUS
  Administered 2023-08-10: 5 mg via INTRAVENOUS

## 2023-08-10 MED ORDER — LACTATED RINGERS IV SOLN: INTRAVENOUS | Status: DC

## 2023-08-10 MED ORDER — BUPIVACAINE-EPINEPHRINE (PF) 0.25% -1:200000 IJ SOLN
INTRAMUSCULAR | Status: DC | PRN
  Administered 2023-08-10: 20 mL

## 2023-08-10 SURGICAL SUPPLY — 52 items
ADH SKN CLS APL DERMABOND .7 (GAUZE/BANDAGES/DRESSINGS) ×1
APL PRP STRL LF DISP 70% ISPRP (MISCELLANEOUS) ×1
APPLIER CLIP 9.375 MED OPEN (MISCELLANEOUS) ×1
APR CLP MED 9.3 20 MLT OPN (MISCELLANEOUS) ×1
BAG DECANTER FOR FLEXI CONT (MISCELLANEOUS) IMPLANT
BINDER BREAST LRG (GAUZE/BANDAGES/DRESSINGS) IMPLANT
BINDER BREAST MEDIUM (GAUZE/BANDAGES/DRESSINGS) IMPLANT
BINDER BREAST XLRG (GAUZE/BANDAGES/DRESSINGS) IMPLANT
BINDER BREAST XXLRG (GAUZE/BANDAGES/DRESSINGS) IMPLANT
BLADE SURG 15 STRL LF DISP TIS (BLADE) ×1 IMPLANT
BLADE SURG 15 STRL SS (BLADE) ×1
CANISTER SUC SOCK COL 7IN (MISCELLANEOUS) IMPLANT
CANISTER SUCT 1200ML W/VALVE (MISCELLANEOUS) IMPLANT
CHLORAPREP W/TINT 26 (MISCELLANEOUS) ×1 IMPLANT
CLIP APPLIE 9.375 MED OPEN (MISCELLANEOUS) IMPLANT
COVER BACK TABLE 60X90IN (DRAPES) ×1 IMPLANT
COVER MAYO STAND STRL (DRAPES) ×1 IMPLANT
COVER PROBE CYLINDRICAL 5X96 (MISCELLANEOUS) ×1 IMPLANT
DERMABOND ADVANCED .7 DNX12 (GAUZE/BANDAGES/DRESSINGS) ×1 IMPLANT
DRAPE LAPAROSCOPIC ABDOMINAL (DRAPES) IMPLANT
DRAPE LAPAROTOMY 100X72 PEDS (DRAPES) ×1 IMPLANT
DRAPE UTILITY XL STRL (DRAPES) ×1 IMPLANT
ELECT COATED BLADE 2.86 ST (ELECTRODE) ×1 IMPLANT
ELECT REM PT RETURN 9FT ADLT (ELECTROSURGICAL) ×1
ELECTRODE REM PT RTRN 9FT ADLT (ELECTROSURGICAL) ×1 IMPLANT
GLOVE BIOGEL PI IND STRL 7.0 (GLOVE) IMPLANT
GLOVE BIOGEL PI IND STRL 8 (GLOVE) ×1 IMPLANT
GLOVE ECLIPSE 8.0 STRL XLNG CF (GLOVE) ×1 IMPLANT
GLOVE SURG SS PI 6.5 STRL IVOR (GLOVE) IMPLANT
GOWN STRL REUS W/ TWL LRG LVL3 (GOWN DISPOSABLE) ×2 IMPLANT
GOWN STRL REUS W/ TWL XL LVL3 (GOWN DISPOSABLE) ×1 IMPLANT
GOWN STRL REUS W/TWL LRG LVL3 (GOWN DISPOSABLE) ×2
GOWN STRL REUS W/TWL XL LVL3 (GOWN DISPOSABLE) ×1
HEMOSTAT ARISTA ABSORB 3G PWDR (HEMOSTASIS) IMPLANT
HEMOSTAT SNOW SURGICEL 2X4 (HEMOSTASIS) IMPLANT
KIT MARKER MARGIN INK (KITS) ×1 IMPLANT
NDL HYPO 25X1 1.5 SAFETY (NEEDLE) ×1 IMPLANT
NEEDLE HYPO 25X1 1.5 SAFETY (NEEDLE) ×1
NS IRRIG 1000ML POUR BTL (IV SOLUTION) ×1 IMPLANT
PACK BASIN DAY SURGERY FS (CUSTOM PROCEDURE TRAY) ×1 IMPLANT
PENCIL SMOKE EVACUATOR (MISCELLANEOUS) ×1 IMPLANT
SLEEVE SCD COMPRESS KNEE MED (STOCKING) ×1 IMPLANT
SPIKE FLUID TRANSFER (MISCELLANEOUS) IMPLANT
SPONGE T-LAP 4X18 ~~LOC~~+RFID (SPONGE) ×1 IMPLANT
SUT MNCRL AB 4-0 PS2 18 (SUTURE) ×1 IMPLANT
SUT SILK 2 0 SH (SUTURE) IMPLANT
SUT VICRYL 3-0 CR8 SH (SUTURE) ×1 IMPLANT
SYR CONTROL 10ML LL (SYRINGE) ×1 IMPLANT
TOWEL GREEN STERILE FF (TOWEL DISPOSABLE) ×1 IMPLANT
TRAY FAXITRON CT DISP (TRAY / TRAY PROCEDURE) ×1 IMPLANT
TUBE CONNECTING 20X1/4 (TUBING) IMPLANT
YANKAUER SUCT BULB TIP NO VENT (SUCTIONS) IMPLANT

## 2023-08-10 NOTE — Op Note (Signed)
Preoperative diagnosis: Stage I left breast cancer upper outer quadrant ER positive  Postoperative diagnosis: Same  Procedure: Left breast seed localized lumpectomy  Surgeon: Harriette Bouillon, MD  Anesthesia: LMA with 0.25% Marcaine with epinephrine  EBL: Minimal  Specimen: Left breast tissue with seed and clip verified by intraoperative imaging  Indications for procedure: The patient is a 73 year old female with stage I left breast cancer.  She was seen in the multidisciplinary clinic and opted for breast conserving surgery after reviewing all of her options of treatment.The procedure has been discussed with the patient. Alternatives to surgery have been discussed with the patient.  Risks of surgery include bleeding,  Infection,  Seroma formation, death,  and the need for further surgery.   The patient understands and wishes to proceed.    Description of procedure: The patient was met in the holding area and questions were answered.  The left breast was marked as the correct site and films were available for review.  A seed was placed as an outpatient.  She was taken back to the operating room.  She was placed supine upon the operating room table.  After induction of general anesthesia, left breast was prepped and draped in a sterile fashion and timeout performed.  Neoprobe was used to identify the seed in the left breast upper outer quadrant which corresponded to her films.  A curvilinear incision was made over the signal.  Skin flaps were raised.  Dissection was carried down and all tissue around the seed and clip were excised with a grossly negative margin.  Imaging of the specimen after orientation with ink revealed the seed and clip to be in the mass.  This was sent to pathology.  The cavity is irrigated.  It was made hemostatic with cautery.  0.25% Marcaine with epinephrine was infiltrated throughout the cavity.  Clips were used to mark the cavity.  Deep tissue planes were approximated with 3-0  Vicryl.  4 Monocryl was used to close the skin in a subcuticular fashion.  Dermabond applied.  Breast binder placed.  All counts were found to be correct.  The patient was awoke extubated taken to recovery in satisfactory condition.

## 2023-08-10 NOTE — Interval H&P Note (Signed)
History and Physical Interval Note:  08/10/2023 7:30 AM  [name]  has presented today for surgery, with the diagnosis of LEFT BREAST CANCER.  The various methods of treatment have been discussed with the patient and family. After consideration of risks, benefits and other options for treatment, the patient has consented to  Procedure(s): LEFT BREAST LUMPECTOMY WITH RADIOACTIVE SEED LOCALIZATION (Left) as a surgical intervention.  The patient's history has been reviewed, patient examined, no change in status, stable for surgery.  I have reviewed the patient's chart and labs.  Questions were answered to the patient's satisfaction.     Marguerite Jarboe A Cortez Flippen

## 2023-08-10 NOTE — H&P (Signed)
History of Present Illness: [name] is a 72 y.o. female who is seen today as an office consultation for evaluation of Breast Cancer  The patient is seen today in the multidisciplinary clinic for evaluation of newly diagnosed left breast cancer. The patient was noted on screening mammogram to have a 1.2 cm mass left breast lower quadrant core biopsy proven to be consistent with grade 1 invasive ductal carcinoma ER positive PR positive HER2/neu negative with a KI of 20%. Her axillary ultrasound was normal. She has no complaints.  Review of Systems: A complete review of systems was obtained from the patient. I have reviewed this information and discussed as appropriate with the patient. See HPI as well for other ROS.    Medical History: Past Medical History:  Diagnosis Date  Anxiety  History of cancer  Hyperlipidemia  Sleep apnea   Patient Active Problem List  Diagnosis  Anxiety  Malignant neoplasm of upper-outer quadrant of left breast in female, estrogen receptor positive (CMS/HHS-HCC)   Past Surgical History:  Procedure Laterality Date  Implantable Loop recorder removal N/A 01/25/2000  Implantable Loop Recorder placement N/A 12/11/2019  ARTHROSCOPIC ROTATOR CUFF REPAIR  TONSILLECTOMY N/A  1970    No Known Allergies  Current Outpatient Medications on File Prior to Visit  Medication Sig Dispense Refill  ALPRAZolam (XANAX) 0.5 MG tablet Take 0.5 mg by mouth 2 (two) times daily as needed for Anxiety Take 0.5 tab to 1 tablet twice a day as needed for anxiety  aspirin 81 MG EC tablet Take 81 mg by mouth once daily  progesterone (PROMETRIUM) 100 MG capsule Take 100 mg by mouth once daily At bedtime  cholecalciferol, vitamin D3, 10 mcg (400 unit) Cap Take 1 tablet by mouth once daily   No current facility-administered medications on file prior to visit.   Family History  Problem Relation Age of Onset  Skin cancer Father  Diabetes Father  Obesity Brother    Social  History   Tobacco Use  Smoking Status Former  Types: Cigarettes  Smokeless Tobacco Never  Tobacco Comments  Quit smoking cigarettes 30 years ago    Social History   Socioeconomic History  Marital status: Single  Tobacco Use  Smoking status: Former  Types: Cigarettes  Smokeless tobacco: Never  Tobacco comments:  Quit smoking cigarettes 30 years ago  Vaping Use  Vaping status: Never Used  Substance and Sexual Activity  Alcohol use: Yes  Drug use: Never   Social Determinants of Health   Financial Resource Strain: Low Risk (01/13/2023)  Received from [facility]  Overall Financial Resource Strain (CARDIA)  Difficulty of Paying Living Expenses: Not hard at all  Food Insecurity: No Food Insecurity (11/04/2022)  Received [facility]  Hunger Vital Sign  Worried About Running Out of Food in the Last Year: Never true  Ran Out of Food in the Last Year: Never true  Transportation Needs: No Transportation Needs (11/04/2022)  Received from [facility] - Transportation  Lack of Transportation (Medical): No  Lack of Transportation (Non-Medical): No  Physical Activity: Unknown (11/04/2022)  Received from [facility]  Exercise Vital Sign  Days of Exercise per Week: 3 days  Stress: No Stress Concern Present (01/13/2023)  Received from [facility] - Occupational Stress Questionnaire  Feeling of Stress : Only a little  Social Connections: Socially Integrated (11/04/2022)  Received from [facility]  Social Network  How would you rate your social network (family, work, friends)?:  Good participation with social networks   Objective:  There were no vitals filed for this visit.  There is no height or weight on file to calculate BMI.  Physical Exam Exam conducted with a chaperone present.  HENT:  Head: Normocephalic.  Cardiovascular:  Rate and Rhythm: Normal rate.  Chest:  Breasts: Right: Normal.  Left: Normal.   Comments: Bx changes left  Musculoskeletal:  Cervical back: Normal range of motion.  Lymphadenopathy:  Upper Body:  Right upper body: No axillary adenopathy.  Left upper body: No axillary adenopathy.  Skin: General: Skin is warm.  Neurological:  General: No focal deficit present.  Mental Status: She is alert.  Psychiatric:  Mood and Affect: Mood normal.     Labs, Imaging and Diagnostic Testing:  SURGICAL PATHOLOGY [facility] [address] [place], [place] [address] Telephone [phone] or [phone] Fax [phone]  REPORT OF SURGICAL PATHOLOGY  Accession #: [id number] Patient Name: [name], [name] Visit # :  MRN: [id number] Physician: Antonietta Jewel DOB/Age 02/25/1951 (Age: 82) Gender: F Collected Date: 07/13/2023 Received Date: 07/13/2023  FINAL DIAGNOSIS  1. Breast, left, needle core biopsy, 12 o'clock, 6cmfn : - INVASIVE DUCTAL CARCINOMA, SEE NOTE - TUBULE FORMATION: SCORE 2 - NUCLEAR PLEOMORPHISM: SCORE 2 - MITOTIC COUNT: SCORE 1 - TOTAL SCORE: 5 - OVERALL GRADE: 1 - LYMPHOVASCULAR INVASION: NOT IDENTIFIED - CANCER LENGTH: 0.6 CM - CALCIFICATIONS: NOT IDENTIFIED NOTE: DR. Kenard Gower REVIEWED THE CASE AND CONCURS WITH THE INTERPRETATION. A BREAST PROGNOSTIC PROFILE (ER, PR, KI-67 AND HER2) IS PENDING AND WILL BE REPORTED IN AN ADDENDUM. SOLIS WAS NOTIFIED ON 07/14/2023.  DATE SIGNED OUT: 07/14/2023 ELECTRONIC SIGNATURE Morris Callas M.D., Nupur, Pathologist, Electronic Signature  MICROSCOPIC DESCRIPTION  CASE COMMENTS STAINS USED IN DIAGNOSIS: H&E-2 H&E-3 H&E-4 H&E *RECUT 1 SLIDE Stains used in diagnosis 1 Her2 by IHC, 1 ER-ACIS, 1 KI-67-ACIS, 1 PR-ACIS, 1 Her2 FISH IHC scores are reported using ASCO/CAP scoring criteria. An IHC Score of 0 or 1+ is NEGATIVE for HER2, 3+ is POSITIVE for HER2, and 2+ is EQUIVOCAL. Equivocal results are reflexed to either FISH or IHC testing. Specimens are fixed in 10% Neutral  Buffered Formalin for at least 6 hours and up to 72 hours. These tests have not be validated on decalcified tissue. Results should be interpreted with caution given the possibility of false negative results on decalcified specimens. Antibody Clone for HER2 is 4B5 (PATHWAY). Some of these immunohistochemical stains may have been developed and the performance characteristics determined by [facility]. Some may not have been cleared or approved by the U.S. Food and Drug Administration. The FDA has determined that such clearance or approval is not necessary. This test is used for clinical purposes. It should not be regarded as investigational or for research. This laboratory is certified under the Clinical Laboratory Improvement Amendments of 1988 (CLIA-88) as qualified to perform high complexity clinical laboratory testing. Estrogen receptor (6F11), immunohistochemical stains are performed on formalin fixed, paraffin embedded tissue using a 3,3"-diaminobenzidine (DAB) chromogen and Leica Bond Autostainer System. The staining intensity of the nucleus is scored manually and is reported as the percentage of tumor cell nuclei demonstrating specific nuclear staining.Specimens are fixed in 10% Neutral Buffered Formalin for at least 6 hours and up to 72 hours. These tests have not be validated on decalcified tissue. Results should be interpreted with caution given the possibility of false negative results on decalcified specimens. Ki-67 (MM1), immunohistochemical stains are performed on formalin fixed, paraffin embedded  tissue using a 3,3"-diaminobenzidine (DAB) chromogen and Leica Bond Autostainer System. The staining intensity of the nucleus is scored manually and is reported as the percentage of tumor cell nuclei demonstrating specific nuclear staining.Specimens are fixed in 10% Neutral Buffered Formalin for at least 6 hours and up to 72 hours. These tests have not be validated  on decalcified tissue. Results should be interpreted with caution given the possibility of false negative results on decalcified specimens. PR progesterone receptor (16), immunohistochemical stains are performed on formalin fixed, paraffin embedded tissue using a 3,3"-diaminobenzidine (DAB) chromogen and Leica Bond Autostainer System. The staining intensity of the nucleus is scored manually and is reported as the percentage of tumor cell nuclei demonstrating specific nuclear staining.Specimens are fixed in 10% Neutral Buffered Formalin for at least 6 hours and up to 72 hours. These tests have not be validated on decalcified tissue. Results should be interpreted with caution given the possibility of false negative results on decalcified specimens. HER2 IQFISH pharmDX (code [id number]) is a direct fluorescence in-situ hybridization assay designed to quantitatively determine HER2 gene amplification in formalin-fixed, paraffin-embedded tissue specimens. Results are reported using ASCO/CAP and manufacturer's scoring criteria. GROUP 1: Ratio of HER2/CEN 17 >=2.0 and average HER2 >=4.0 signals/cell = POSITIVE. GROUP 2: Ratio of HER2 /CEN 17 >=2.0 and average HER2 < 4.0 signals/cell with an IHC of 2+ = Review of 20 additional cells. If score is still >=2.0 and average HER2 < 4.0 = NEGATIVE. GROUP 3: Ratio of Her2/CEN 17 < 2.0 and average HER2 >=6.0 signals/cell with an IHC of 2+ = Review of 20 additional cells. If score is still < 2.0 and average HER2 >=6.0 signals/cell= POSITIVE. GROUP 4: Ratio of Her2 /CEN 17 < 2.0 and average HER2 >=4.0 and < 6.0 signals/cell with an IHC score of 2+ =Review of 20 additional cells. If score is still < 2.0 and average HER2 >=4.0 and < 6.0 signals/cell= NEGATIVE. GROUP 5: Ratio of HER2/CEN 17 < 2.0 and average HER2 < 4.0 signals/cell = NEGATIVE Specimens are fixed in 10% Neutral Buffered Formalin for at least 6 hours and up to 72 hours. These tests have not  been validated on decalcified tissue. Results should be interpreted with caution given the possibility of false negative results on decalcified specimens.  ADDENDUM 1A) Breast, left, needle core biopsy, 12 o'clock, 6cmfn PROGNOSTIC INDICATORS  Results: IMMUNOHISTOCHEMICAL AND MORPHOMETRIC ANALYSIS PERFORMED MANUALLY The tumor cells are QUIVOCAL for Her2 (2+). Her2 by FISH will be performed and the results reported separately. Estrogen Receptor: 100%, POSITIVE, STRONG STAINING INTENSITY Progesterone Receptor: 100%, POSITIVE, STRONG STAINING INTENSITY Proliferation Marker Ki67: 20% REFERENCE RANGE ESTROGEN RECEPTOR NEGATIVE 0% POSITIVE =>1% REFERENCE RANGE PROGESTERONE RECEPTOR NEGATIVE 0% POSITIVE =>1% All controls stained appropriately Jacelyn Grip, John, Pathologist, Electronic Signature ( Signed 09 13 2024) 1A-Breast,left,needle core biopsy FLUORESCENCE IN-SITU HYBRIDIZATION  Results: GROUP 5: HER2 **NEGATIVE**  Equivocal form of amplification of the HER2 gene was detected in the IHC 2+ tissue sample received from this individual. HER2 FISH was performed by a technologist and cell imaging and analysis on the BioView. RATIO OF HER2/CEN17 SIGNALS 1.11 AVERAGE HER2 COPY NUMBER PER CELL 2.10 The ratio of HER2/CEN 17 is within the range < 2.0 of HER2/CEN 17 and a copy number of HER2 signals per cell is <4.0. Arch Pathol Lab Med 1:1,2018 Arville Care, [place], Sports administrator, International aid/development worker [phone])  CLINICAL HISTORY  SPECIMEN(S) OBTAINED 1. Breast, left, needle core biopsy, 12 O'clock, 6cmfn  SPECIMEN COMMENTS: 1. TIF: 1:30  PM; 72 year old female with 1.1cm suspicious mass SPECIMEN CLINICAL INFORMATION: 1. [facility]  Gross Description 1. Received in formalin labeled with the patient's name and "left" are three cores of fibroadipose tissue ranging from 1.1 to 1.6 cm in length, each measuring 0.1 cm in diameter. The specimen is entirely submitted in one block. TIF 1:30  p.m. CIT 5 mins. (KW:gt, 07/14/23)  Left breast 1.2 cm mass UOQ Axilla negative   Assessment and Plan:   Diagnoses and all orders for this visit:  Malignant neoplasm of upper-outer quadrant of left breast in female, estrogen receptor positive (CMS/HHS-HCC)   Reviewed the pathophysiology of breast cancer. Reviewed surgical options of mastectomy with reconstruction or breast conserving surgery. Discussed the pros and cons of axillary sentinel lymph node mapping and utility of this in women over the age of 45. Discussed lymphedema and pain afterwards. The patient has chosen left seed lumpectomy alone. Risks and benefits reviewed. Risk of bleeding, infection, swelling, pain, cosmetic deformity, and the need for the treatment and other procedures were reviewed today with the patient.The procedure has been discussed with the patient. Alternatives to surgery have been discussed with the patient. Risks of surgery include bleeding, Infection, Seroma formation, death, and the need for further surgery. The patient understands and wishes to proceed.    Hayden Rasmussen, MD

## 2023-08-10 NOTE — Transfer of Care (Signed)
Immediate Anesthesia Transfer of Care Note  Patient: [name]  Procedure(s) Performed: LEFT BREAST LUMPECTOMY WITH RADIOACTIVE SEED LOCALIZATION (Left: Breast)  Patient Location: PACU  Anesthesia Type:General  Level of Consciousness: awake and oriented  Airway & Oxygen Therapy: Patient Spontanous Breathing and Patient connected to face mask oxygen  Post-op Assessment: Report given to RN and Post -op Vital signs reviewed and stable  Post vital signs: Reviewed and stable  Last Vitals:  Vitals Value Taken Time  BP 118/80 08/10/23 0838  Temp    Pulse 74 08/10/23 0841  Resp 14 08/10/23 0841  SpO2 100 % 08/10/23 0841  Vitals shown include unfiled device data.  Last Pain:  Vitals:   08/10/23 0650  TempSrc: Temporal  PainSc: 0-No pain         Complications: No notable events documented.

## 2023-08-10 NOTE — Discharge Instructions (Addendum)
[facility] Office Phone Number [phone]  BREAST BIOPSY/ PARTIAL MASTECTOMY: POST OP INSTRUCTIONS  Always review your discharge instruction sheet given to you by the facility where your surgery was performed.  IF YOU HAVE DISABILITY OR FAMILY LEAVE FORMS, YOU MUST BRING THEM TO THE OFFICE FOR PROCESSING.  DO NOT GIVE THEM TO YOUR DOCTOR.  A prescription for pain medication may be given to you upon discharge.  Take your pain medication as prescribed, if needed.  If narcotic pain medicine is not needed, then you may take acetaminophen (Tylenol) or ibuprofen (Advil) as needed. Take your usually prescribed medications unless otherwise directed If you need a refill on your pain medication, please contact your pharmacy.  They will contact our office to request authorization.  Prescriptions will not be filled after 5pm or on week-ends. You should eat very light the first 24 hours after surgery, such as soup, crackers, pudding, etc.  Resume your normal diet the day after surgery. Most patients will experience some swelling and bruising in the breast.  Ice packs and a good support bra will help.  Swelling and bruising can take several days to resolve.  It is common to experience some constipation if taking pain medication after surgery.  Increasing fluid intake and taking a stool softener will usually help or prevent this problem from occurring.  A mild laxative (Milk of Magnesia or Miralax) should be taken according to package directions if there are no bowel movements after 48 hours. Unless discharge instructions indicate otherwise, you may remove your bandages 24-48 hours after surgery, and you may shower at that time.  You may have steri-strips (small skin tapes) in place directly over the incision.  These strips should be left on the skin for 7-10 days.  If your surgeon used skin glue on the incision, you may shower in 24 hours.  The glue will flake off over the next 2-3 weeks.  Any  sutures or staples will be removed at the office during your follow-up visit. ACTIVITIES:  You may resume regular daily activities (gradually increasing) beginning the next day.  Wearing a good support bra or sports bra minimizes pain and swelling.  You may have sexual intercourse when it is comfortable. You may drive when you no longer are taking prescription pain medication, you can comfortably wear a seatbelt, and you can safely maneuver your car and apply brakes. RETURN TO WORK:  ______________________________________________________________________________________ [name] should see your doctor in the office for a follow-up appointment approximately two weeks after your surgery.  Your doctor's nurse will typically make your follow-up appointment when she calls you with your pathology report.  Expect your pathology report 2-3 business days after your surgery.  You may call to check if you do not hear from [place] after three days. OTHER INSTRUCTIONS: _______________________________________________________________________________________________ _____________________________________________________________________________________________________________________________________ _____________________________________________________________________________________________________________________________________ _____________________________________________________________________________________________________________________________________  WHEN TO CALL YOUR DOCTOR: Fever over 101.0 Nausea and/or vomiting. Extreme swelling or bruising. Continued bleeding from incision. Increased pain, redness, or drainage from the incision.  The clinic staff is available to answer your questions during regular business hours.  Please don't hesitate to call and ask to speak to one of the nurses for clinical concerns.  If you have a medical emergency, go to the nearest emergency room or call 911.  A surgeon from [facility] is always on call at the hospital.  For further questions, please visit [email or web address]     May take tylenol after 1pm as needed. May take Oxycodone after 4:30p as needed.  Post Anesthesia Home Care Instructions  Activity: Get plenty of rest for the remainder of the day. A responsible individual must stay with you for 24 hours following the procedure.  For the next 24 hours, DO NOT: -Drive a car -Advertising copywriter -Drink alcoholic beverages -Take any medication unless instructed by your physician -Make any legal decisions or sign important papers.  Meals: Start with liquid foods such as gelatin or soup. Progress to regular foods as tolerated. Avoid greasy, spicy, heavy foods. If nausea and/or vomiting occur, drink only clear liquids until the nausea and/or vomiting subsides. Call your physician if vomiting continues.  Special Instructions/Symptoms: Your throat may feel dry or sore from the anesthesia or the breathing tube placed in your throat during surgery. If this causes discomfort, gargle with warm salt water. The discomfort should disappear within 24 hours.  If you had a scopolamine patch placed behind your ear for the management of post- operative nausea and/or vomiting:  1. The medication in the patch is effective for 72 hours, after which it should be removed.  Wrap patch in a tissue and discard in the trash. Wash hands thoroughly with soap and water. 2. You may remove the patch earlier than 72 hours if you experience unpleasant side effects which may include dry mouth, dizziness or visual disturbances. 3. Avoid touching the patch. Wash your hands with soap and water after contact with the patch.

## 2023-08-10 NOTE — Anesthesia Procedure Notes (Signed)
Procedure Name: LMA Insertion Date/Time: 08/10/2023 7:45 AM  Performed by: Demetrio Lapping, CRNAPre-anesthesia Checklist: Patient identified, Emergency Drugs available, Suction available and Patient being monitored Patient Re-evaluated:Patient Re-evaluated prior to induction Oxygen Delivery Method: Circle System Utilized Preoxygenation: Pre-oxygenation with 100% oxygen Induction Type: IV induction Ventilation: Mask ventilation without difficulty LMA: LMA inserted LMA Size: 4.0 Number of attempts: 1 Airway Equipment and Method: Bite block Placement Confirmation: positive ETCO2 Tube secured with: Tape Dental Injury: Teeth and Oropharynx as per pre-operative assessment

## 2023-08-10 NOTE — Anesthesia Preprocedure Evaluation (Addendum)
Anesthesia Evaluation  Patient identified by MRN, date of birth, ID band Patient awake    Reviewed: Allergy & Precautions, NPO status , Patient's Chart, lab work & pertinent test results  Airway Mallampati: II  TM Distance: >3 FB Neck ROM: Full    Dental no notable dental hx.    Pulmonary former smoker   Pulmonary exam normal        Cardiovascular hypertension, Normal cardiovascular exam+ dysrhythmias Atrial Fibrillation      Neuro/Psych   Anxiety     negative neurological ROS     GI/Hepatic negative GI ROS, Neg liver ROS,,,  Endo/Other  negative endocrine ROS    Renal/GU negative Renal ROS     Musculoskeletal  (+) Arthritis ,    Abdominal  (+) + obese  Peds  Hematology negative hematology ROS (+)   Anesthesia Other Findings LEFT BREAST CANCER  Reproductive/Obstetrics                             Anesthesia Physical Anesthesia Plan  ASA: 2  Anesthesia Plan: General   Post-op Pain Management:    Induction: Intravenous  PONV Risk Score and Plan: 3 and Ondansetron, Dexamethasone, Midazolam and Treatment may vary due to age or medical condition  Airway Management Planned: LMA  Additional Equipment:   Intra-op Plan:   Post-operative Plan: Extubation in OR  Informed Consent: I have reviewed the patients History and Physical, chart, labs and discussed the procedure including the risks, benefits and alternatives for the proposed anesthesia with the patient or authorized representative who has indicated his/her understanding and acceptance.     Dental advisory given  Plan Discussed with: CRNA  Anesthesia Plan Comments:        Anesthesia Quick Evaluation

## 2023-08-10 NOTE — Anesthesia Postprocedure Evaluation (Signed)
Anesthesia Post Note  Patient: [name]  Procedure(s) Performed: LEFT BREAST LUMPECTOMY WITH RADIOACTIVE SEED LOCALIZATION (Left: Breast)     Patient location during evaluation: PACU Anesthesia Type: General Level of consciousness: awake Pain management: pain level controlled Vital Signs Assessment: post-procedure vital signs reviewed and stable Respiratory status: spontaneous breathing, nonlabored ventilation and respiratory function stable Cardiovascular status: blood pressure returned to baseline and stable Postop Assessment: no apparent nausea or vomiting Anesthetic complications: no   No notable events documented.  Last Vitals:  Vitals:   08/10/23 0915 08/10/23 0925  BP: 114/72 112/66  Pulse: 72 75  Resp: 13 14  Temp:  (!) 36.1 C  SpO2: 96% 95%    Last Pain:  Vitals:   08/10/23 0930  TempSrc:   PainSc: 3                  [name] P Calem Cocozza

## 2023-08-11 ENCOUNTER — Encounter (HOSPITAL_BASED_OUTPATIENT_CLINIC_OR_DEPARTMENT_OTHER): Payer: Self-pay | Admitting: Surgery

## 2023-08-12 ENCOUNTER — Encounter: Payer: Self-pay | Admitting: Surgery

## 2023-08-12 LAB — SURGICAL PATHOLOGY

## 2023-08-13 ENCOUNTER — Telehealth: Payer: Self-pay | Admitting: *Deleted

## 2023-08-13 DIAGNOSIS — Z170 Estrogen receptor positive status [ER+]: Secondary | ICD-10-CM

## 2023-08-13 NOTE — Telephone Encounter (Signed)
Spoke with patient to discuss questions she had regarding her tx plan. Discussed the need for oncotype and we should have those results in a couple weeks, and if no chemo is recommended then her next step would be discuss xrt with Dr. Roselind Messier. Patient verbalized understanding. She would like a referral to talk with a dietician and so I have placed that referral.

## 2023-08-16 ENCOUNTER — Inpatient Hospital Stay: Payer: Medicare HMO | Attending: Hematology and Oncology | Admitting: General Practice

## 2023-08-16 NOTE — Progress Notes (Signed)
[facility] Spiritual Care Note  [name] completed her Health Care Power of Attorney and Living Will forms at [facility].  She chooses [name] of [place] [phone]) as her health care agent, with [name] of [place] [phone]) as her next choice.  [name] initialed all three sections under item 1 of the Living Will, indicating that she desires that her life not be prolonged by life-prolonging measures in those situations.  She initialed that she does want artificial nutrition and artificial hydration.  She initialed to follow the health care agent if the agent gives instructions that differ from the desires expressed in the Living Will.  Original and copies to [name]; one copy to Health Information Management to be scanned into Electronic Medical Record.   [address] [place], [place], [facility] Pager [phone] Voicemail [phone]

## 2023-08-17 ENCOUNTER — Telehealth: Payer: Self-pay | Admitting: *Deleted

## 2023-08-17 ENCOUNTER — Encounter: Payer: Self-pay | Admitting: *Deleted

## 2023-08-17 NOTE — Telephone Encounter (Signed)
Received order for oncotype testing. Requisition sent to pathology.

## 2023-08-20 ENCOUNTER — Encounter: Payer: Self-pay | Admitting: Hematology and Oncology

## 2023-08-23 ENCOUNTER — Telehealth: Payer: Self-pay

## 2023-08-23 NOTE — Telephone Encounter (Signed)
Patient called to see when she can she schedule an appointment to see Dr. Roselind Messier before the end of the year.

## 2023-08-24 ENCOUNTER — Inpatient Hospital Stay: Payer: Medicare HMO | Admitting: Dietician

## 2023-08-24 NOTE — Progress Notes (Signed)
Nutrition Assessment   Reason for Assessment: Pt request    ASSESSMENT: 72 year old female with newly diagnosed malignant neoplasm of left breast. Treatment plan currently under work-up (awaiting oncotype). Patient is under the care of Dr. Al Pimple  Past medical history includes atrial fibrillation, HLD, vit D deficiency, hallux rigidus   Met with patient and husband in office. Patient is here today to discuss current supplement regimen and dietary recommendations for estrogen positive cancer. Patient is a lacto-ovo vegetarian. She eats a lot of tofu and concerned about soy intake with breast cancer. She has also watched a lot of youtube videos and unsure about accuracy of the information presented. Patient has a bag of supplements, some she purchased from [facility] after hearing about the benefits discussed.   Nutrition Focused Physical Exam: deferred    Medications: xanax, D3, B12, cardizem, MVI, omega 3, roxicodone   Labs: 9/18 labs reviewed    Anthropometrics:   Height: 5'6" Weight: 214 lb 8.1 oz  UBW: 215-218 lb  BMI: 34.62   NUTRITION DIAGNOSIS: Food and nutrition related knowledge deficit related to newly diagnosed cancer as evidenced by no prior need for associated nutrition information   INTERVENTION:  Educated on [email or web address].org for evidenced based information. Encouraged utilizing this site vs google/youtube Went through supplements brought in by patient. Recommend continuing B12, omega 3, daily MVI. Additional need for supplements to be discussed with PCP (ie vit D, Ca) All questions answered Educational handouts provided    MONITORING, EVALUATION, GOAL: Patient will tolerate adequate calories and plant-based proteins to maintain strength, minimize wt loss during treatment   Next Visit: No follow-up scheduled. Patient encouraged to contact with nutrition questions/concerns

## 2023-08-25 ENCOUNTER — Telehealth: Payer: Self-pay | Admitting: *Deleted

## 2023-08-25 ENCOUNTER — Encounter: Payer: Self-pay | Admitting: *Deleted

## 2023-08-25 DIAGNOSIS — Z170 Estrogen receptor positive status [ER+]: Secondary | ICD-10-CM

## 2023-08-25 NOTE — Telephone Encounter (Signed)
Received Oncotype results of 8/3%. Referral placed for Dr. Roselind Messier.

## 2023-08-25 NOTE — Progress Notes (Signed)
Location of Breast Cancer:Left ***  Histology per Pathology Report: ***  Receptor Status: ER(100%), PR (100%), Her2-neu (Equivocal with IHC), Ki-67(20%)  Did patient present with symptoms (if so, please note symptoms) or was this found on screening mammography?: ***  Past/Anticipated interventions by surgeon, if any:{t:21944} ***  Past/Anticipated interventions by medical oncology, if any: Chemotherapy ***  Lymphedema issues, if any:  {:18581} {t:21944}   Pain issues, if any:  {:18581} {PAIN DESCRIPTION:21022940}  SAFETY ISSUES: Prior radiation? {:18581} Pacemaker/ICD? {:18581} Possible current pregnancy?{:18581} Is the patient on methotrexate? {:18581}  Current Complaints / other details:  ***

## 2023-08-25 NOTE — Progress Notes (Signed)
Radiation Oncology         [phone] ________________________________  Name: [name] MRN: [id number]  Date: 08/26/2023  DOB: [birth year]  Re-Evaluation Note  CC: [name], Ferd Hibbs, MD  No diagnosis found.  Diagnosis:  Left Breast UOQ, Invasive and in situ ductal carcinoma, ER+ / PR+ / Her2-, Grade 2: s/p lumpectomy without SLN evaluation    Cancer Staging  Malignant neoplasm of upper-outer quadrant of left breast in female, estrogen receptor positive (HCC) Staging form: Breast, AJCC 8th Edition - Clinical stage from 07/21/2023: Stage IA (cT1c, cN0, cM0, G1, ER+, PR+, HER2-) - Signed by [name], MD on 07/21/2023  Narrative:  The patient returns today to discuss radiation treatment options. She was seen in the multidisciplinary breast clinic on 07/21/23.   She underwent genetic testing on her consultation date. Results showed no clinically significant variants detected by BRCAplus or +RNAinsight testing. However, a variant of uncertain significance was detected in the MSH2 gene.   She opted to proceed with a left breast lumpectomy without nodal biopsies on 08/10/23 under the care of [facility]. Pathology from the procedure revealed: tumor the size of 1.3 cm; histology of grade 2 invasive ductal carcinoma with intermediate grade DCIS; all margins negative for invasive and in situ carcinoma. Prognostic indicators significant for: estrogen receptor 100% positive and progesterone receptor 100% positive, both with strong staining intensity; Proliferation marker Ki67 at 20%; Her2 status negative; Grade 2.    Oncotype DX was obtained on the final surgical sample and the recurrence score of 8 predicts a risk of recurrence outside the breast over the next 9 years of 3%, if the patient's only systemic therapy were to be an antiestrogen for 5 years. It also predicts no significant benefit from chemotherapy.  Based on Oncotype results and her discussion with Dr. Al Pimple  during her breast clinic consultation, she will not require chemo and will proceed with antiestrogen therapy after XRT. Antiestrogen therapy will likely consist of Tamoxifen or Anastrozole pending further discussion with Dr. Al Pimple.   On review of systems, the patient reports ***. She denies *** and any other symptoms.    Allergies:  has No Known Allergies.  Meds: Current Outpatient Medications  Medication Sig Dispense Refill   ALPRAZolam (XANAX) 0.5 MG tablet Take 0.5 mg by mouth at bedtime as needed for anxiety.     Ascorbic Acid (VITAMIN C PO) Take 1 tablet by mouth daily.     Cholecalciferol (VITAMIN D3) 10 MCG (400 UNIT) CAPS Take 1 tablet by mouth daily.     Cyanocobalamin (VITAMIN B 12 PO) Take 1 tablet by mouth daily.     diltiazem (CARDIZEM) 30 MG tablet Cardizem 30mg  -- take 1 tablet every 4 hours AS NEEDED for heart rate >100 30 tablet 1   fluticasone (FLONASE) 50 MCG/ACT nasal spray Place 2 sprays into both nostrils daily. (Patient not taking: Reported on 07/09/2021) 16 g 6   Multiple Vitamin (MULTIVITAMIN) tablet Take 1 tablet by mouth daily.     Omega 3 1000 MG CAPS Take 1 capsule by mouth daily.     oxyCODONE (OXY IR/ROXICODONE) 5 MG immediate release tablet Take 1 tablet (5 mg total) by mouth every 6 (six) hours as needed for severe pain. 15 tablet 0   sulfacetamide (BLEPH-10) 10 % ophthalmic solution Place 1 drop into both eyes as needed.     No current facility-administered medications for this encounter.    Physical Findings: The patient is in no acute  distress. Patient is alert and oriented.  vitals were not taken for this visit.  No significant changes. Lungs are clear to auscultation bilaterally. Heart has regular rate and rhythm. No palpable cervical, supraclavicular, or axillary adenopathy. Abdomen soft, non-tender, normal bowel sounds. Right Breast: no palpable mass, nipple discharge or bleeding. Left Breast: ***  Lab Findings: Lab Results  Component Value Date    WBC 8.8 07/21/2023   HGB 14.2 07/21/2023   HCT 42.2 07/21/2023   MCV 93.2 07/21/2023   PLT 251 07/21/2023    Radiographic Findings: No results found.  Impression:  Left Breast UOQ, Invasive and in situ ductal carcinoma, ER+ / PR+ / Her2-, Grade 2: s/p lumpectomy without SLN evaluation   ***  Plan:  Patient is scheduled for CT simulation {date/later today}. ***  -----------------------------------  Billie Lade, PhD, MD  This document serves as a record of services personally performed by Antony Blackbird, MD. It was created on his behalf by Neena Rhymes, a trained medical scribe. The creation of this record is based on the scribe's personal observations and the provider's statements to them. This document has been checked and approved by the attending provider.

## 2023-08-26 ENCOUNTER — Ambulatory Visit
Admission: RE | Admit: 2023-08-26 | Discharge: 2023-08-26 | Disposition: A | Discharge status: Home / Self Care | Payer: Medicare HMO | Source: Ambulatory Visit | Attending: Radiation Oncology | Admitting: Radiation Oncology

## 2023-08-26 ENCOUNTER — Other Ambulatory Visit: Payer: Self-pay

## 2023-08-26 ENCOUNTER — Encounter: Payer: Self-pay | Admitting: Radiation Oncology

## 2023-08-26 VITALS — BP 123/68 | HR 71 | Temp 97.9°F | Resp 17 | Wt 213.8 lb

## 2023-08-26 DIAGNOSIS — Z170 Estrogen receptor positive status [ER+]: Secondary | ICD-10-CM

## 2023-08-26 DIAGNOSIS — C50112 Malignant neoplasm of central portion of left female breast: Secondary | ICD-10-CM | POA: Insufficient documentation

## 2023-08-26 DIAGNOSIS — Z923 Personal history of irradiation: Secondary | ICD-10-CM | POA: Insufficient documentation

## 2023-08-26 DIAGNOSIS — Z79899 Other long term (current) drug therapy: Secondary | ICD-10-CM | POA: Insufficient documentation

## 2023-08-26 NOTE — Addendum Note (Signed)
Encounter addended by: Benard Halsted, LPN on: 96/02/5408 10:52 AM  Actions taken: Flowsheet accepted

## 2023-08-27 ENCOUNTER — Encounter (HOSPITAL_COMMUNITY): Payer: Self-pay

## 2023-08-29 ENCOUNTER — Encounter: Payer: Self-pay | Admitting: Radiation Oncology

## 2023-08-31 ENCOUNTER — Encounter: Payer: Self-pay | Admitting: *Deleted

## 2023-09-01 ENCOUNTER — Inpatient Hospital Stay
Admission: RE | Admit: 2023-09-01 | Discharge: 2023-09-01 | Disposition: A | Discharge status: Home / Self Care | Payer: Self-pay | Source: Ambulatory Visit | Attending: Radiation Oncology | Admitting: Radiation Oncology

## 2023-09-01 ENCOUNTER — Other Ambulatory Visit: Payer: Self-pay | Admitting: Radiation Oncology

## 2023-09-01 ENCOUNTER — Ambulatory Visit
Admission: RE | Admit: 2023-09-01 | Discharge: 2023-09-01 | Disposition: A | Discharge status: Home / Self Care | Payer: Medicare HMO | Source: Ambulatory Visit | Attending: Radiation Oncology | Admitting: Radiation Oncology

## 2023-09-01 DIAGNOSIS — C50412 Malignant neoplasm of upper-outer quadrant of left female breast: Secondary | ICD-10-CM

## 2023-09-01 DIAGNOSIS — Z170 Estrogen receptor positive status [ER+]: Secondary | ICD-10-CM

## 2023-09-01 DIAGNOSIS — C50112 Malignant neoplasm of central portion of left female breast: Secondary | ICD-10-CM | POA: Diagnosis present

## 2023-09-03 ENCOUNTER — Encounter: Payer: Self-pay | Admitting: Radiation Oncology

## 2023-09-06 ENCOUNTER — Ambulatory Visit: Payer: Medicare HMO

## 2023-09-06 ENCOUNTER — Encounter: Payer: Self-pay | Admitting: Hematology and Oncology

## 2023-09-06 ENCOUNTER — Ambulatory Visit: Payer: Medicare HMO | Admitting: Radiation Oncology

## 2023-09-08 ENCOUNTER — Encounter: Payer: Self-pay | Admitting: *Deleted

## 2023-09-08 DIAGNOSIS — C50412 Malignant neoplasm of upper-outer quadrant of left female breast: Secondary | ICD-10-CM

## 2023-09-14 DIAGNOSIS — C50412 Malignant neoplasm of upper-outer quadrant of left female breast: Secondary | ICD-10-CM | POA: Insufficient documentation

## 2023-09-14 DIAGNOSIS — Z170 Estrogen receptor positive status [ER+]: Secondary | ICD-10-CM | POA: Diagnosis present

## 2023-09-15 ENCOUNTER — Ambulatory Visit: Payer: Medicare HMO | Admitting: Radiation Oncology

## 2023-09-16 ENCOUNTER — Ambulatory Visit: Payer: Medicare HMO

## 2023-09-16 ENCOUNTER — Ambulatory Visit
Admission: RE | Admit: 2023-09-16 | Discharge: 2023-09-16 | Disposition: A | Discharge status: Home / Self Care | Payer: Medicare HMO | Source: Ambulatory Visit | Attending: Radiation Oncology | Admitting: Radiation Oncology

## 2023-09-16 ENCOUNTER — Other Ambulatory Visit: Payer: Self-pay

## 2023-09-16 ENCOUNTER — Inpatient Hospital Stay (HOSPITAL_BASED_OUTPATIENT_CLINIC_OR_DEPARTMENT_OTHER): Payer: Medicare HMO | Attending: Hematology and Oncology | Admitting: Hematology and Oncology

## 2023-09-16 VITALS — BP 145/70 | HR 71 | Temp 97.5°F | Resp 16 | Wt 217.8 lb

## 2023-09-16 DIAGNOSIS — Z170 Estrogen receptor positive status [ER+]: Secondary | ICD-10-CM | POA: Insufficient documentation

## 2023-09-16 DIAGNOSIS — C50412 Malignant neoplasm of upper-outer quadrant of left female breast: Secondary | ICD-10-CM | POA: Insufficient documentation

## 2023-09-16 LAB — RAD ONC ARIA SESSION SUMMARY
Course Elapsed Days: 0
Plan Fractions Treated to Date: 1
Plan Prescribed Dose Per Fraction: 2.67 Gy
Plan Total Fractions Prescribed: 15
Plan Total Prescribed Dose: 40.05 Gy
Reference Point Dosage Given to Date: 2.67 Gy
Reference Point Session Dosage Given: 2.67 Gy
Session Number: 1

## 2023-09-16 NOTE — Assessment & Plan Note (Signed)
This is a very pleasant 72 yr old female with newly diagnosed left breast IDC, ER 100% positive, PR 100% positive, Her 2 neg, Ki 67 20% referred to breast [facility] for additional recommendations.  Given small tumor, ER positive, node negativity, we have discussed about oncotype followed by adjuvant radiation and antiestrogen therapy.  Assessment and Plan    Grade 1 Invasive Ductal Carcinoma T1cN0 (1.2 cm), estrogen and progesterone positive, HER2 negative, tumor at the 12 o'clock position. Patient is well-informed and at peace with the diagnosis. -Status post lumpectomy, negative margins,  -Oncotype score of 8 indicating a very low risk of recurrence (3% at 9 years). Discussed the lack of benefit from chemotherapy in this low-risk group. -Plan for radiation therapy. -Start Anastrozole after completion of radiation therapy. Discussed the mechanism of action and potential side effects including menopausal symptoms and joint aches, bone density loss etc.  Radiation Therapy Discussed potential side effects including fatigue and skin changes. -Continue with planned radiation therapy. -Consider pausing certain vitamins during radiation therapy.  Bone Health Discussed potential for accelerated bone loss with Anastrozole. -Order bone density test now for baseline.  General Health Maintenance -Continue with multivitamin, Vitamin D3, Calcium, and B12 supplementation. -Consider pausing other vitamins during radiation therapy. -Follow-up in 6 weeks after completion of radiation therapy to start Anastrozole.

## 2023-09-16 NOTE — Progress Notes (Signed)
[facility] CONSULT NOTE  Patient Care Team: [name], PA-C as PCP - General (Physician Assistant) Hillis Range, MD (Inactive) as PCP - Cardiology (Cardiology) Hillis Range, MD (Inactive) as Consulting Physician (Cardiology) Sallye Lat, MD as Consulting Physician (Ophthalmology) Harriette Bouillon, MD as Consulting Physician (General Surgery) [name], MD as Consulting Physician (Hematology and Oncology) Antony Blackbird, MD as Consulting Physician (Radiation Oncology) Donnelly Angelica, RN as Oncology Nurse Navigator Pershing Proud, RN as Oncology Nurse Navigator  CHIEF COMPLAINTS/PURPOSE OF CONSULTATION:  Newly diagnosed breast cancer  HISTORY OF PRESENTING ILLNESS:  [name] 72 y.o. female is here because of recent diagnosis of left breast IDC.  I reviewed her records extensively and collaborated the history with the patient.  SUMMARY OF ONCOLOGIC HISTORY: Oncology History  Malignant neoplasm of upper-outer quadrant of left breast in female, estrogen receptor positive (HCC)  07/09/2023 Mammogram   Patient had screening mammogram and diagnostic mammogram which confirmed a 1.1 x 1 x 0.8 cm irregular mass in the left breast at 12:00 middle depth 6 cm from the nipple, irregular mass is hypoechoic.  Ultrasound-guided biopsy is recommended.  No significant abnormalities were seen sonographically in the left axilla.   07/13/2023 Pathology Results   Left breast needle core biopsy showed overall grade 1 invasive ductal carcinoma prognostics indicated ER 100% positive strong staining, PR 100% positive strong staining, Ki-67 of 20% and group 5 HER2 negative   07/19/2023 Initial Diagnosis   Malignant neoplasm of upper-outer quadrant of left breast in female, estrogen receptor positive (HCC)   07/21/2023 Cancer Staging   Staging form: Breast, AJCC 8th Edition - Clinical stage from 07/21/2023: Stage IA (cT1c, cN0, cM0, G1, ER+, PR+, HER2-) - Signed by [name], MD on 07/21/2023 Stage prefix: Initial diagnosis Histologic grading system: 3 grade system Laterality: Left Staged by: Pathologist and managing physician Stage used in treatment planning: Yes National guidelines used in treatment planning: Yes    Genetic Testing   [facility] CancerNext Panel+RNA was Negative. Of note, a variant of uncertain significance was identified in the MSH2 gene ([id number]). Report date is 07/30/2023.  The CancerNext gene panel offered by [employer] includes sequencing, rearrangement analysis, and RNA analysis for the following 34 genes:   APC, ATM, AXIN2, BARD1, BMPR1A, BRCA1, BRCA2, BRIP1, CDH1, CDK4, CDKN2A, CHEK2, DICER1, HOXB13, EPCAM, GREM1, MLH1, MSH2, MSH3, MSH6, MUTYH, NF1, NTHL1, PALB2, PMS2, POLD1, POLE, PTEN, RAD51C, RAD51D, SMAD4, SMARCA4, STK11, and TP53.     Definitive Surgery   she had left breast lumpectomy, pathology showed grade 2 IDC measuring 1.3 cm, negative margins for both invasive and DCIS.  Prognostics were ER/PR positive HER2 negative Ki-67 of 20% from the previous biopsy Oncotype DX of 8, no benefit from adjuvant chemotherapy she is now status post adjuvant radiation and is here to initiate antiestrogen therapy.    Discussed the use of AI scribe software for clinical note transcription with the patient, who gave verbal consent to proceed.  History of Present Illness    The patient, a 72 year old woman with a history of invasive ductal carcinoma stage one, grade two, estrogen and progesterone positive, HER negative , presents for a follow-up visit after surgery. She recently underwent Oncotype testing, which returned a score of eight, indicating a low risk of cancer recurrence. The patient expresses relief at this result, stating her desire to live to at least 72 years old.  She expresses concerns about potential side effects, particularly menopausal symptoms such as hot flashes, which  she experienced during her menopause.  The patient  also discusses her diet and vitamin intake, expressing concerns about what she should and should not consume during radiation therapy.   The patient's genetic test results are also discussed, which show a variant of uncertain significance in the MSH2 gene.  MEDICAL HISTORY:  Past Medical History:  Diagnosis Date   Acute conjunctivitis of both eyes 04/05/2019   Allergy    Anxiety    Arthritis    Atrial fibrillation (HCC)    Chicken pox    Colon polyp    Essential hypertension 10/03/2018   patient denies diagnosis of afib   Fibroids    Uterine   GERD (gastroesophageal reflux disease)    Heart murmur    History of colon polyps    Hyperlipidemia    Overweight     SURGICAL HISTORY: Past Surgical History:  Procedure Laterality Date   ATRIAL FIBRILLATION ABLATION  01/2023   BREAST LUMPECTOMY WITH RADIOACTIVE SEED LOCALIZATION Left 08/10/2023   Procedure: LEFT BREAST LUMPECTOMY WITH RADIOACTIVE SEED LOCALIZATION;  Surgeon: Harriette Bouillon, MD;  Location: [facility];  Service: General;  Laterality: Left;   implantable loop recorder placement  12/11/2019    Medtronic Reveal Linq model LNQ 2 ([id number]) implantable loop recorder   implantable loop recorder removal  01/25/2020   MDT Reveal LINQ removed in office due to patient request due to anxiety of having an implanted device,  removed without complication   NOSE SURGERY     ROTATOR CUFF REPAIR     TONSILLECTOMY  1970   WISDOM TOOTH EXTRACTION      SOCIAL HISTORY: Social History   Socioeconomic History   Marital status: Single    Spouse name: Not on file   Number of children: Not on file   Years of education: Not on file   Highest education level: Not on file  Occupational History   Not on file  Tobacco Use   Smoking status: Former    Types: Cigarettes   Smokeless tobacco: Never   Tobacco comments:    smoked off and on for about 25 years  Vaping Use   Vaping status: Never Used  Substance and Sexual  Activity   Alcohol use: Yes    Alcohol/week: 0.0 standard drinks of alcohol    Comment: seldom   Drug use: No   Sexual activity: Yes    Partners: Male  Other Topics Concern   Not on file  Social History Narrative   Marital status/children/pets: Single.    Education/employment: retired Photographer:      -smoke alarm in the home:Yes     - wears seatbelt: Yes     - Feels safe in their relationships: Yes   Social Determinants of Health   Financial Resource Strain: Low Risk  (08/20/2023)   Received from [facility]   Overall Financial Resource Strain (CARDIA)    Difficulty of Paying Living Expenses: Not hard at all  Food Insecurity: No Food Insecurity (08/26/2023)   Hunger Vital Sign    Worried About Running Out of Food in the Last Year: Never true    Ran Out of Food in the Last Year: Never true  Transportation Needs: No Transportation Needs (08/26/2023)   PRAPARE - Administrator, Civil Service (Medical): No    Lack of Transportation (Non-Medical): No  Physical Activity: Insufficiently Active (08/20/2023)   Received from [facility]   Exercise Vital Sign    Days  of Exercise per Week: 1 day    Minutes of Exercise per Session: 20 min  Stress: Stress Concern Present (08/20/2023)   Received from [facility] - Occupational Stress Questionnaire    Feeling of Stress : To some extent  Social Connections: Socially Integrated (08/20/2023)   Received from [facility]   Social Network    How would you rate your social network (family, work, friends)?: Good participation with social networks  Intimate Partner Violence: Not At Risk (08/26/2023)   Humiliation, Afraid, Rape, and Kick questionnaire    Fear of Current or Ex-Partner: No    Emotionally Abused: No    Physically Abused: No    Sexually Abused: No    FAMILY HISTORY: Family History  Problem Relation Age of Onset   Hearing loss Mother     Macular degeneration Mother    Alzheimer's disease Father    Hypertension Father    Heart disease Father    Lung cancer Sister 43       smoked   Heart disease Brother    Colon cancer Maternal Uncle 79   Ovarian cancer Maternal Grandmother 65   Emphysema Maternal Grandfather    Prostate cancer Paternal Grandfather    Esophageal cancer Neg Hx    Rectal cancer Neg Hx    Breast cancer Neg Hx     ALLERGIES:  has No Known Allergies.  MEDICATIONS:  Current Outpatient Medications  Medication Sig Dispense Refill   ALPRAZolam (XANAX) 0.5 MG tablet Take 0.5 mg by mouth at bedtime as needed for anxiety.     Ascorbic Acid (VITAMIN C PO) Take 1 tablet by mouth daily.     Cholecalciferol (VITAMIN D3) 10 MCG (400 UNIT) CAPS Take 1 tablet by mouth daily.     Cyanocobalamin (VITAMIN B 12 PO) Take 1 tablet by mouth daily.     Multiple Vitamin (MULTIVITAMIN) tablet Take 1 tablet by mouth daily.     Omega 3 1000 MG CAPS Take 1 capsule by mouth daily.     oxyCODONE (OXY IR/ROXICODONE) 5 MG immediate release tablet Take 1 tablet (5 mg total) by mouth every 6 (six) hours as needed for severe pain. (Patient not taking: Reported on 08/26/2023) 15 tablet 0   No current facility-administered medications for this visit.    REVIEW OF SYSTEMS:   Constitutional: Denies fevers, chills or abnormal night sweats Eyes: Denies blurriness of vision, double vision or watery eyes Ears, nose, mouth, throat, and face: Denies mucositis or sore throat Respiratory: Denies cough, dyspnea or wheezes Cardiovascular: Denies palpitation, chest discomfort or lower extremity swelling Gastrointestinal:  Denies nausea, heartburn or change in bowel habits Skin: Denies abnormal skin rashes Lymphatics: Denies new lymphadenopathy or easy bruising Neurological:Denies numbness, tingling or new weaknesses Behavioral/Psych: Mood is stable, no new changes  Breast:  Denies any palpable lumps or discharge All other systems were reviewed  with the patient and are negative.  PHYSICAL EXAMINATION: ECOG PERFORMANCE STATUS: 0 - Asymptomatic  Vitals:   09/16/23 1157  BP: (!) 145/70  Pulse: 71  Resp: 16  Temp: (!) 97.5 F (36.4 C)  SpO2: 100%   Filed Weights   09/16/23 1157  Weight: 217 lb 12.8 oz (98.8 kg)    GENERAL:alert, no distress and comfortable Left breast surgical incision appears well, some scab formation noted.  No evidence of infection.  Small postop seroma noted.  LABORATORY DATA:  I have reviewed the data as listed Lab Results  Component  Value Date   WBC 8.8 07/21/2023   HGB 14.2 07/21/2023   HCT 42.2 07/21/2023   MCV 93.2 07/21/2023   PLT 251 07/21/2023   Lab Results  Component Value Date   NA 140 07/21/2023   K 4.3 07/21/2023   CL 108 07/21/2023   CO2 29 07/21/2023   Pathology   1. Breast, left, needle core biopsy, 12 o'clock, 6cmfn :       - INVASIVE DUCTAL CARCINOMA, SEE NOTE       - TUBULE FORMATION: SCORE 2       - NUCLEAR PLEOMORPHISM: SCORE 2       - MITOTIC COUNT: SCORE 1       - TOTAL SCORE: 5       - OVERALL GRADE: 1       - LYMPHOVASCULAR INVASION: NOT IDENTIFIED       - CANCER LENGTH: 0.6 CM       - CALCIFICATIONS: NOT IDENTIFIED   Estrogen Receptor:  100%, POSITIVE, STRONG STAINING INTENSITY Progesterone Receptor:  100%, POSITIVE, STRONG STAINING INTENSITY Proliferation Marker Ki67:  20%  GROUP 5:   HER2 **NEGATIVE**   RADIOGRAPHIC STUDIES: I have personally reviewed the radiological reports and agreed with the findings in the report.  ASSESSMENT AND PLAN:  Malignant neoplasm of upper-outer quadrant of left breast in female, estrogen receptor positive (HCC) This is a very pleasant 72 yr old female with newly diagnosed left breast IDC, ER 100% positive, PR 100% positive, Her 2 neg, Ki 67 20% referred to breast MDC for additional recommendations.  Given small tumor, ER positive, node negativity, we have discussed about oncotype followed by adjuvant radiation and  antiestrogen therapy.  Assessment and Plan    Grade 1 Invasive Ductal Carcinoma T1cN0 (1.2 cm), estrogen and progesterone positive, HER2 negative, tumor at the 12 o'clock position. Patient is well-informed and at peace with the diagnosis. -Status post lumpectomy, negative margins,  -Oncotype score of 8 indicating a very low risk of recurrence (3% at 9 years). Discussed the lack of benefit from chemotherapy in this low-risk group. -Plan for radiation therapy. -Start Anastrozole after completion of radiation therapy. Discussed the mechanism of action and potential side effects including menopausal symptoms and joint aches, bone density loss etc.  Radiation Therapy Discussed potential side effects including fatigue and skin changes. -Continue with planned radiation therapy. -Consider pausing certain vitamins during radiation therapy.  Bone Health Discussed potential for accelerated bone loss with Anastrozole. -Order bone density test now for baseline.  General Health Maintenance -Continue with multivitamin, Vitamin D3, Calcium, and B12 supplementation. -Consider pausing other vitamins during radiation therapy. -Follow-up in 6 weeks after completion of radiation therapy to start Anastrozole.         All questions were answered. The patient knows to call the clinic with any problems, questions or concerns.    [name], MD 09/16/23

## 2023-09-17 ENCOUNTER — Ambulatory Visit: Payer: Medicare HMO

## 2023-09-17 ENCOUNTER — Telehealth: Payer: Self-pay | Admitting: Hematology and Oncology

## 2023-09-17 ENCOUNTER — Ambulatory Visit
Admission: RE | Admit: 2023-09-17 | Discharge: 2023-09-17 | Disposition: A | Discharge status: Home / Self Care | Payer: Medicare HMO | Source: Ambulatory Visit | Attending: Radiation Oncology | Admitting: Radiation Oncology

## 2023-09-17 ENCOUNTER — Other Ambulatory Visit: Payer: Self-pay

## 2023-09-17 DIAGNOSIS — C50412 Malignant neoplasm of upper-outer quadrant of left female breast: Secondary | ICD-10-CM | POA: Diagnosis not present

## 2023-09-17 LAB — RAD ONC ARIA SESSION SUMMARY
Course Elapsed Days: 1
Plan Fractions Treated to Date: 2
Plan Prescribed Dose Per Fraction: 2.67 Gy
Plan Total Fractions Prescribed: 15
Plan Total Prescribed Dose: 40.05 Gy
Reference Point Dosage Given to Date: 5.34 Gy
Reference Point Session Dosage Given: 2.67 Gy
Session Number: 2

## 2023-09-17 NOTE — Telephone Encounter (Signed)
Spoke with patient confirming upcoming appointment  

## 2023-09-20 ENCOUNTER — Other Ambulatory Visit: Payer: Self-pay

## 2023-09-20 ENCOUNTER — Ambulatory Visit: Payer: Medicare HMO

## 2023-09-20 ENCOUNTER — Ambulatory Visit
Admission: RE | Admit: 2023-09-20 | Discharge: 2023-09-20 | Disposition: A | Discharge status: Home / Self Care | Payer: Medicare HMO | Source: Ambulatory Visit | Attending: Radiation Oncology | Admitting: Radiation Oncology

## 2023-09-20 DIAGNOSIS — C50412 Malignant neoplasm of upper-outer quadrant of left female breast: Secondary | ICD-10-CM | POA: Diagnosis not present

## 2023-09-20 LAB — RAD ONC ARIA SESSION SUMMARY
Course Elapsed Days: 4
Plan Fractions Treated to Date: 3
Plan Prescribed Dose Per Fraction: 2.67 Gy
Plan Total Fractions Prescribed: 15
Plan Total Prescribed Dose: 40.05 Gy
Reference Point Dosage Given to Date: 8.01 Gy
Reference Point Session Dosage Given: 2.67 Gy
Session Number: 3

## 2023-09-21 ENCOUNTER — Ambulatory Visit
Admission: RE | Admit: 2023-09-21 | Discharge: 2023-09-21 | Disposition: A | Discharge status: Home / Self Care | Payer: Medicare HMO | Source: Ambulatory Visit | Attending: Radiation Oncology | Admitting: Radiation Oncology

## 2023-09-21 ENCOUNTER — Ambulatory Visit: Payer: Medicare HMO

## 2023-09-21 ENCOUNTER — Other Ambulatory Visit: Payer: Self-pay

## 2023-09-21 DIAGNOSIS — C50412 Malignant neoplasm of upper-outer quadrant of left female breast: Secondary | ICD-10-CM | POA: Diagnosis not present

## 2023-09-21 LAB — RAD ONC ARIA SESSION SUMMARY
Course Elapsed Days: 5
Plan Fractions Treated to Date: 4
Plan Prescribed Dose Per Fraction: 2.67 Gy
Plan Total Fractions Prescribed: 15
Plan Total Prescribed Dose: 40.05 Gy
Reference Point Dosage Given to Date: 10.68 Gy
Reference Point Session Dosage Given: 2.67 Gy
Session Number: 4

## 2023-09-21 MED ORDER — ALRA NON-METALLIC DEODORANT (RAD-ONC)
1.0000 | Freq: Once | TOPICAL | Status: AC
  Administered 2023-09-21: 1 via TOPICAL

## 2023-09-21 MED ORDER — RADIAPLEXRX EX GEL: Freq: Once | CUTANEOUS | Status: AC

## 2023-09-22 ENCOUNTER — Ambulatory Visit
Admission: RE | Admit: 2023-09-22 | Discharge: 2023-09-22 | Disposition: A | Discharge status: Home / Self Care | Payer: Medicare HMO | Source: Ambulatory Visit | Attending: Radiation Oncology | Admitting: Radiation Oncology

## 2023-09-22 ENCOUNTER — Other Ambulatory Visit: Payer: Self-pay

## 2023-09-22 ENCOUNTER — Ambulatory Visit: Payer: Medicare HMO

## 2023-09-22 DIAGNOSIS — C50412 Malignant neoplasm of upper-outer quadrant of left female breast: Secondary | ICD-10-CM | POA: Diagnosis not present

## 2023-09-22 LAB — RAD ONC ARIA SESSION SUMMARY
Course Elapsed Days: 6
Plan Fractions Treated to Date: 5
Plan Prescribed Dose Per Fraction: 2.67 Gy
Plan Total Fractions Prescribed: 15
Plan Total Prescribed Dose: 40.05 Gy
Reference Point Dosage Given to Date: 13.35 Gy
Reference Point Session Dosage Given: 2.67 Gy
Session Number: 5

## 2023-09-23 ENCOUNTER — Ambulatory Visit
Admission: RE | Admit: 2023-09-23 | Discharge: 2023-09-23 | Disposition: A | Discharge status: Home / Self Care | Payer: Medicare HMO | Source: Ambulatory Visit | Attending: Radiation Oncology | Admitting: Radiation Oncology

## 2023-09-23 ENCOUNTER — Ambulatory Visit: Payer: Medicare HMO

## 2023-09-23 ENCOUNTER — Other Ambulatory Visit: Payer: Self-pay

## 2023-09-23 DIAGNOSIS — C50412 Malignant neoplasm of upper-outer quadrant of left female breast: Secondary | ICD-10-CM | POA: Diagnosis not present

## 2023-09-23 LAB — RAD ONC ARIA SESSION SUMMARY
Course Elapsed Days: 7
Plan Fractions Treated to Date: 6
Plan Prescribed Dose Per Fraction: 2.67 Gy
Plan Total Fractions Prescribed: 15
Plan Total Prescribed Dose: 40.05 Gy
Reference Point Dosage Given to Date: 16.02 Gy
Reference Point Session Dosage Given: 2.67 Gy
Session Number: 6

## 2023-09-24 ENCOUNTER — Ambulatory Visit
Admission: RE | Admit: 2023-09-24 | Discharge: 2023-09-24 | Disposition: A | Discharge status: Home / Self Care | Payer: Medicare HMO | Source: Ambulatory Visit | Attending: Radiation Oncology | Admitting: Radiation Oncology

## 2023-09-24 ENCOUNTER — Other Ambulatory Visit: Payer: Self-pay

## 2023-09-24 ENCOUNTER — Ambulatory Visit: Payer: Medicare HMO

## 2023-09-24 DIAGNOSIS — C50412 Malignant neoplasm of upper-outer quadrant of left female breast: Secondary | ICD-10-CM | POA: Diagnosis not present

## 2023-09-24 LAB — RAD ONC ARIA SESSION SUMMARY
Course Elapsed Days: 8
Plan Fractions Treated to Date: 7
Plan Prescribed Dose Per Fraction: 2.67 Gy
Plan Total Fractions Prescribed: 15
Plan Total Prescribed Dose: 40.05 Gy
Reference Point Dosage Given to Date: 18.69 Gy
Reference Point Session Dosage Given: 2.67 Gy
Session Number: 7

## 2023-09-26 ENCOUNTER — Ambulatory Visit: Payer: Medicare HMO

## 2023-09-27 ENCOUNTER — Other Ambulatory Visit: Payer: Self-pay

## 2023-09-27 ENCOUNTER — Ambulatory Visit
Admission: RE | Admit: 2023-09-27 | Discharge: 2023-09-27 | Disposition: A | Discharge status: Home / Self Care | Payer: Medicare HMO | Source: Ambulatory Visit | Attending: Radiation Oncology | Admitting: Radiation Oncology

## 2023-09-27 ENCOUNTER — Ambulatory Visit: Payer: Medicare HMO

## 2023-09-27 DIAGNOSIS — C50412 Malignant neoplasm of upper-outer quadrant of left female breast: Secondary | ICD-10-CM | POA: Diagnosis not present

## 2023-09-27 LAB — RAD ONC ARIA SESSION SUMMARY
Course Elapsed Days: 11
Plan Fractions Treated to Date: 8
Plan Prescribed Dose Per Fraction: 2.67 Gy
Plan Total Fractions Prescribed: 15
Plan Total Prescribed Dose: 40.05 Gy
Reference Point Dosage Given to Date: 21.36 Gy
Reference Point Session Dosage Given: 2.67 Gy
Session Number: 8

## 2023-09-28 ENCOUNTER — Ambulatory Visit: Payer: Medicare HMO

## 2023-09-28 ENCOUNTER — Ambulatory Visit
Admission: RE | Admit: 2023-09-28 | Discharge: 2023-09-28 | Disposition: A | Discharge status: Home / Self Care | Payer: Medicare HMO | Source: Ambulatory Visit | Attending: Radiation Oncology | Admitting: Radiation Oncology

## 2023-09-28 ENCOUNTER — Other Ambulatory Visit: Payer: Self-pay

## 2023-09-28 DIAGNOSIS — C50412 Malignant neoplasm of upper-outer quadrant of left female breast: Secondary | ICD-10-CM | POA: Diagnosis not present

## 2023-09-28 LAB — RAD ONC ARIA SESSION SUMMARY
Course Elapsed Days: 12
Plan Fractions Treated to Date: 9
Plan Prescribed Dose Per Fraction: 2.67 Gy
Plan Total Fractions Prescribed: 15
Plan Total Prescribed Dose: 40.05 Gy
Reference Point Dosage Given to Date: 24.03 Gy
Reference Point Session Dosage Given: 2.67 Gy
Session Number: 9

## 2023-09-29 ENCOUNTER — Other Ambulatory Visit: Payer: Self-pay

## 2023-09-29 ENCOUNTER — Ambulatory Visit
Admission: RE | Admit: 2023-09-29 | Discharge: 2023-09-29 | Disposition: A | Discharge status: Home / Self Care | Payer: Medicare HMO | Source: Ambulatory Visit | Attending: Radiation Oncology | Admitting: Radiation Oncology

## 2023-09-29 ENCOUNTER — Ambulatory Visit: Payer: Medicare HMO

## 2023-09-29 DIAGNOSIS — C50412 Malignant neoplasm of upper-outer quadrant of left female breast: Secondary | ICD-10-CM | POA: Diagnosis not present

## 2023-09-29 LAB — RAD ONC ARIA SESSION SUMMARY
Course Elapsed Days: 13
Plan Fractions Treated to Date: 10
Plan Prescribed Dose Per Fraction: 2.67 Gy
Plan Total Fractions Prescribed: 15
Plan Total Prescribed Dose: 40.05 Gy
Reference Point Dosage Given to Date: 26.7 Gy
Reference Point Session Dosage Given: 2.67 Gy
Session Number: 10

## 2023-10-04 ENCOUNTER — Other Ambulatory Visit: Payer: Self-pay

## 2023-10-04 ENCOUNTER — Ambulatory Visit
Admission: RE | Admit: 2023-10-04 | Discharge: 2023-10-04 | Disposition: A | Discharge status: Home / Self Care | Payer: Medicare HMO | Source: Ambulatory Visit | Attending: Radiation Oncology | Admitting: Radiation Oncology

## 2023-10-04 ENCOUNTER — Ambulatory Visit: Payer: Medicare HMO

## 2023-10-04 DIAGNOSIS — Z170 Estrogen receptor positive status [ER+]: Secondary | ICD-10-CM | POA: Diagnosis present

## 2023-10-04 DIAGNOSIS — R350 Frequency of micturition: Secondary | ICD-10-CM | POA: Insufficient documentation

## 2023-10-04 DIAGNOSIS — C50412 Malignant neoplasm of upper-outer quadrant of left female breast: Secondary | ICD-10-CM | POA: Insufficient documentation

## 2023-10-04 LAB — RAD ONC ARIA SESSION SUMMARY
Course Elapsed Days: 18
Plan Fractions Treated to Date: 11
Plan Prescribed Dose Per Fraction: 2.67 Gy
Plan Total Fractions Prescribed: 15
Plan Total Prescribed Dose: 40.05 Gy
Reference Point Dosage Given to Date: 29.37 Gy
Reference Point Session Dosage Given: 2.67 Gy
Session Number: 11

## 2023-10-05 ENCOUNTER — Ambulatory Visit: Payer: Medicare HMO | Admitting: Radiation Oncology

## 2023-10-05 ENCOUNTER — Ambulatory Visit: Payer: Medicare HMO

## 2023-10-05 ENCOUNTER — Ambulatory Visit
Admission: RE | Admit: 2023-10-05 | Discharge: 2023-10-05 | Disposition: A | Discharge status: Home / Self Care | Payer: Medicare HMO | Source: Ambulatory Visit | Attending: Radiation Oncology | Admitting: Radiation Oncology

## 2023-10-05 ENCOUNTER — Other Ambulatory Visit: Payer: Self-pay

## 2023-10-05 DIAGNOSIS — C50412 Malignant neoplasm of upper-outer quadrant of left female breast: Secondary | ICD-10-CM | POA: Diagnosis not present

## 2023-10-05 LAB — RAD ONC ARIA SESSION SUMMARY
Course Elapsed Days: 19
Plan Fractions Treated to Date: 12
Plan Prescribed Dose Per Fraction: 2.67 Gy
Plan Total Fractions Prescribed: 15
Plan Total Prescribed Dose: 40.05 Gy
Reference Point Dosage Given to Date: 32.04 Gy
Reference Point Session Dosage Given: 2.67 Gy
Session Number: 12

## 2023-10-06 ENCOUNTER — Other Ambulatory Visit: Payer: Self-pay

## 2023-10-06 ENCOUNTER — Ambulatory Visit
Admission: RE | Admit: 2023-10-06 | Discharge: 2023-10-06 | Disposition: A | Discharge status: Home / Self Care | Payer: Medicare HMO | Source: Ambulatory Visit | Attending: Radiation Oncology | Admitting: Radiation Oncology

## 2023-10-06 ENCOUNTER — Ambulatory Visit: Payer: Medicare HMO

## 2023-10-06 DIAGNOSIS — C50412 Malignant neoplasm of upper-outer quadrant of left female breast: Secondary | ICD-10-CM | POA: Diagnosis not present

## 2023-10-06 LAB — RAD ONC ARIA SESSION SUMMARY
Course Elapsed Days: 20
Plan Fractions Treated to Date: 13
Plan Prescribed Dose Per Fraction: 2.67 Gy
Plan Total Fractions Prescribed: 15
Plan Total Prescribed Dose: 40.05 Gy
Reference Point Dosage Given to Date: 34.71 Gy
Reference Point Session Dosage Given: 2.67 Gy
Session Number: 13

## 2023-10-07 ENCOUNTER — Other Ambulatory Visit: Payer: Self-pay

## 2023-10-07 ENCOUNTER — Ambulatory Visit
Admission: RE | Admit: 2023-10-07 | Discharge: 2023-10-07 | Disposition: A | Discharge status: Home / Self Care | Payer: Medicare HMO | Source: Ambulatory Visit | Attending: Radiation Oncology | Admitting: Radiation Oncology

## 2023-10-07 ENCOUNTER — Ambulatory Visit: Payer: Medicare HMO

## 2023-10-07 DIAGNOSIS — C50412 Malignant neoplasm of upper-outer quadrant of left female breast: Secondary | ICD-10-CM | POA: Diagnosis not present

## 2023-10-07 LAB — RAD ONC ARIA SESSION SUMMARY
Course Elapsed Days: 21
Plan Fractions Treated to Date: 14
Plan Prescribed Dose Per Fraction: 2.67 Gy
Plan Total Fractions Prescribed: 15
Plan Total Prescribed Dose: 40.05 Gy
Reference Point Dosage Given to Date: 37.38 Gy
Reference Point Session Dosage Given: 2.67 Gy
Session Number: 14

## 2023-10-08 ENCOUNTER — Ambulatory Visit: Payer: Medicare HMO

## 2023-10-08 ENCOUNTER — Ambulatory Visit
Admission: RE | Admit: 2023-10-08 | Discharge: 2023-10-08 | Disposition: A | Discharge status: Home / Self Care | Payer: Medicare HMO | Source: Ambulatory Visit | Attending: Radiation Oncology | Admitting: Radiation Oncology

## 2023-10-08 ENCOUNTER — Other Ambulatory Visit: Payer: Self-pay

## 2023-10-08 DIAGNOSIS — C50412 Malignant neoplasm of upper-outer quadrant of left female breast: Secondary | ICD-10-CM | POA: Diagnosis not present

## 2023-10-08 LAB — RAD ONC ARIA SESSION SUMMARY
Course Elapsed Days: 22
Plan Fractions Treated to Date: 15
Plan Prescribed Dose Per Fraction: 2.67 Gy
Plan Total Fractions Prescribed: 15
Plan Total Prescribed Dose: 40.05 Gy
Reference Point Dosage Given to Date: 40.05 Gy
Reference Point Session Dosage Given: 2.67 Gy
Session Number: 15

## 2023-10-11 ENCOUNTER — Ambulatory Visit: Payer: Medicare HMO

## 2023-10-11 ENCOUNTER — Ambulatory Visit
Admission: RE | Admit: 2023-10-11 | Discharge: 2023-10-11 | Disposition: A | Discharge status: Home / Self Care | Payer: Medicare HMO | Source: Ambulatory Visit | Attending: Radiation Oncology | Admitting: Radiation Oncology

## 2023-10-11 ENCOUNTER — Other Ambulatory Visit: Payer: Self-pay

## 2023-10-11 DIAGNOSIS — C50412 Malignant neoplasm of upper-outer quadrant of left female breast: Secondary | ICD-10-CM | POA: Diagnosis not present

## 2023-10-11 LAB — RAD ONC ARIA SESSION SUMMARY
Course Elapsed Days: 25
Plan Fractions Treated to Date: 1
Plan Prescribed Dose Per Fraction: 2 Gy
Plan Total Fractions Prescribed: 5
Plan Total Prescribed Dose: 10 Gy
Reference Point Dosage Given to Date: 2 Gy
Reference Point Session Dosage Given: 2 Gy
Session Number: 16

## 2023-10-12 ENCOUNTER — Other Ambulatory Visit: Payer: Self-pay

## 2023-10-12 ENCOUNTER — Ambulatory Visit: Payer: Medicare HMO

## 2023-10-12 ENCOUNTER — Ambulatory Visit
Admission: RE | Admit: 2023-10-12 | Discharge: 2023-10-12 | Disposition: A | Discharge status: Home / Self Care | Payer: Medicare HMO | Source: Ambulatory Visit | Attending: Radiation Oncology | Admitting: Radiation Oncology

## 2023-10-12 DIAGNOSIS — C50412 Malignant neoplasm of upper-outer quadrant of left female breast: Secondary | ICD-10-CM | POA: Diagnosis not present

## 2023-10-12 LAB — RAD ONC ARIA SESSION SUMMARY
Course Elapsed Days: 26
Plan Fractions Treated to Date: 2
Plan Prescribed Dose Per Fraction: 2 Gy
Plan Total Fractions Prescribed: 5
Plan Total Prescribed Dose: 10 Gy
Reference Point Dosage Given to Date: 4 Gy
Reference Point Session Dosage Given: 2 Gy
Session Number: 17

## 2023-10-13 ENCOUNTER — Other Ambulatory Visit: Payer: Self-pay

## 2023-10-13 ENCOUNTER — Ambulatory Visit: Payer: Medicare HMO

## 2023-10-13 ENCOUNTER — Telehealth: Payer: Self-pay

## 2023-10-13 ENCOUNTER — Ambulatory Visit
Admission: RE | Admit: 2023-10-13 | Discharge: 2023-10-13 | Disposition: A | Discharge status: Home / Self Care | Payer: Medicare HMO | Source: Ambulatory Visit | Attending: Radiation Oncology | Admitting: Radiation Oncology

## 2023-10-13 DIAGNOSIS — C50412 Malignant neoplasm of upper-outer quadrant of left female breast: Secondary | ICD-10-CM | POA: Diagnosis not present

## 2023-10-13 LAB — RAD ONC ARIA SESSION SUMMARY
Course Elapsed Days: 27
Plan Fractions Treated to Date: 3
Plan Prescribed Dose Per Fraction: 2 Gy
Plan Total Fractions Prescribed: 5
Plan Total Prescribed Dose: 10 Gy
Reference Point Dosage Given to Date: 6 Gy
Reference Point Session Dosage Given: 2 Gy
Session Number: 18

## 2023-10-13 LAB — URINALYSIS, COMPLETE (UACMP) WITH MICROSCOPIC
Bilirubin Urine: NEGATIVE
Glucose, UA: NEGATIVE mg/dL
Hgb urine dipstick: NEGATIVE
Ketones, ur: NEGATIVE mg/dL
Leukocytes,Ua: NEGATIVE
Nitrite: NEGATIVE
Protein, ur: NEGATIVE mg/dL
Specific Gravity, Urine: 1.009 (ref 1.005–1.030)
pH: 5 (ref 5.0–8.0)

## 2023-10-13 NOTE — Telephone Encounter (Signed)
Patient called in to report increased urinary frequency and urgency. Reports frequency has affected her sleep pattern. Per patient her PCP is currently out of the office. Patient requesting UA C&S. Patient to come in to clinic after her radiation treatment today.

## 2023-10-14 ENCOUNTER — Other Ambulatory Visit: Payer: Self-pay

## 2023-10-14 ENCOUNTER — Ambulatory Visit: Payer: Medicare HMO

## 2023-10-14 ENCOUNTER — Ambulatory Visit
Admission: RE | Admit: 2023-10-14 | Discharge: 2023-10-14 | Disposition: A | Discharge status: Home / Self Care | Payer: Medicare HMO | Source: Ambulatory Visit | Attending: Radiation Oncology | Admitting: Radiation Oncology

## 2023-10-14 DIAGNOSIS — C50412 Malignant neoplasm of upper-outer quadrant of left female breast: Secondary | ICD-10-CM | POA: Diagnosis not present

## 2023-10-14 LAB — RAD ONC ARIA SESSION SUMMARY
Course Elapsed Days: 28
Plan Fractions Treated to Date: 4
Plan Prescribed Dose Per Fraction: 2 Gy
Plan Total Fractions Prescribed: 5
Plan Total Prescribed Dose: 10 Gy
Reference Point Dosage Given to Date: 8 Gy
Reference Point Session Dosage Given: 2 Gy
Session Number: 19

## 2023-10-14 LAB — URINE CULTURE: Culture: NO GROWTH

## 2023-10-15 ENCOUNTER — Other Ambulatory Visit: Payer: Self-pay

## 2023-10-15 ENCOUNTER — Ambulatory Visit
Admission: RE | Admit: 2023-10-15 | Discharge: 2023-10-15 | Disposition: A | Discharge status: Home / Self Care | Payer: Medicare HMO | Source: Ambulatory Visit | Attending: Radiation Oncology | Admitting: Radiation Oncology

## 2023-10-15 ENCOUNTER — Ambulatory Visit: Payer: Medicare HMO

## 2023-10-15 DIAGNOSIS — C50412 Malignant neoplasm of upper-outer quadrant of left female breast: Secondary | ICD-10-CM | POA: Diagnosis not present

## 2023-10-15 LAB — RAD ONC ARIA SESSION SUMMARY
Course Elapsed Days: 29
Plan Fractions Treated to Date: 5
Plan Prescribed Dose Per Fraction: 2 Gy
Plan Total Fractions Prescribed: 5
Plan Total Prescribed Dose: 10 Gy
Reference Point Dosage Given to Date: 10 Gy
Reference Point Session Dosage Given: 2 Gy
Session Number: 20

## 2023-10-18 ENCOUNTER — Ambulatory Visit: Payer: Medicare HMO

## 2023-10-18 NOTE — Radiation Completion Notes (Signed)
Patient Name: [name], [name] MRN: [id number] Date of Birth: 18-Jun-1951 Referring Physician: Elvia Collum, M.D. Date of Service: 2023-10-18 Radiation Oncologist: Arnette Schaumann, M.D. [facility]                             RADIATION ONCOLOGY END OF TREATMENT NOTE     Diagnosis: C50.112 Malignant neoplasm of central portion of left female breast Staging on 2023-07-21: Malignant neoplasm of upper-outer quadrant of left breast in female, estrogen receptor positive (HCC) T=cT1c, N=cN0, M=cM0 Intent: Curative     ==========DELIVERED PLANS==========  First Treatment Date: 2023-09-16 Last Treatment Date: 2023-10-15   Plan Name: Breast_L_BH Site: Breast, Left Technique: 3D Mode: Photon Dose Per Fraction: 2.67 Gy Prescribed Dose (Delivered / Prescribed): 40.05 Gy / 40.05 Gy Prescribed Fxs (Delivered / Prescribed): 15 / 15   Plan Name: Brst_L_Bst_BH Site: Breast, Left Technique: 3D Mode: Photon Dose Per Fraction: 2 Gy Prescribed Dose (Delivered / Prescribed): 10 Gy / 10 Gy Prescribed Fxs (Delivered / Prescribed): 5 / 5     ==========ON TREATMENT VISIT DATES========== 2023-09-21, 2023-09-28, 2023-10-05, 2023-10-12     ==========UPCOMING VISITS========== 11/25/2023 [facility]-RADIATION ONC FOLLOW UP 15 Antony Blackbird, MD  11/02/2023 [facility]-MED ONCOLOGY EST PT 15 [name], MD        ==========APPENDIX - ON TREATMENT VISIT NOTES==========   See weekly On Treatment Notes in Epic for details in the Media tab (listed as Progress notes on the On Treatment Visit Dates listed above).

## 2023-10-19 ENCOUNTER — Ambulatory Visit: Payer: Medicare HMO

## 2023-10-20 ENCOUNTER — Ambulatory Visit: Payer: Medicare HMO

## 2023-10-21 ENCOUNTER — Encounter: Payer: Self-pay | Admitting: Radiation Oncology

## 2023-10-21 ENCOUNTER — Ambulatory Visit: Payer: Medicare HMO

## 2023-10-21 ENCOUNTER — Ambulatory Visit
Admission: RE | Admit: 2023-10-21 | Discharge: 2023-10-21 | Disposition: A | Discharge status: Home / Self Care | Payer: Medicare HMO | Source: Ambulatory Visit | Attending: Radiation Oncology | Admitting: Radiation Oncology

## 2023-10-21 ENCOUNTER — Telehealth: Payer: Self-pay

## 2023-10-21 VITALS — BP 156/81 | HR 89 | Temp 97.8°F | Resp 18 | Ht 66.0 in | Wt 219.8 lb

## 2023-10-21 DIAGNOSIS — C50112 Malignant neoplasm of central portion of left female breast: Secondary | ICD-10-CM | POA: Insufficient documentation

## 2023-10-21 DIAGNOSIS — Z170 Estrogen receptor positive status [ER+]: Secondary | ICD-10-CM | POA: Diagnosis not present

## 2023-10-21 DIAGNOSIS — Z923 Personal history of irradiation: Secondary | ICD-10-CM | POA: Diagnosis not present

## 2023-10-21 DIAGNOSIS — L598 Other specified disorders of the skin and subcutaneous tissue related to radiation: Secondary | ICD-10-CM | POA: Diagnosis not present

## 2023-10-21 DIAGNOSIS — C50412 Malignant neoplasm of upper-outer quadrant of left female breast: Secondary | ICD-10-CM

## 2023-10-21 MED ORDER — OXYCODONE HCL 5 MG PO TABS: 5.0000 mg | ORAL_TABLET | Freq: Four times a day (QID) | ORAL | 0 refills | Status: DC | PRN

## 2023-10-21 NOTE — Progress Notes (Signed)
Radiation Oncology         [phone] ________________________________  Name: [name] MRN: [id number]  Date: 10/21/2023  DOB: [birth year]  Follow-Up Visit Note  CC: [name], [name], PA-C    ICD-10-CM   1. Malignant neoplasm of upper-outer quadrant of left breast in female, estrogen receptor positive (HCC)  C50.412    Z17.0       Diagnosis: Left Breast UOQ, Invasive and in situ ductal carcinoma, ER+ / PR+ / Her2-, Grade 2: s/p lumpectomy without SLN evaluation    Interval Since Last Radiation: 6 days  Indication for treatment: Curative         Radiation treatment dates: 09/16/23 through 10/15/23    Site/Dose/Technique/Mode:    Site: Breast, Left Technique: 3D Mode: Photon Dose Per Fraction: 2.67 Gy Prescribed Dose (Delivered / Prescribed): 40.05 Gy / 40.05 Gy Prescribed Fxs (Delivered / Prescribed): 15 / 15   Site: Breast, Left Technique: 3D Mode: Photon Dose Per Fraction: 2 Gy Prescribed Dose (Delivered / Prescribed): 10 Gy / 10 Gy Prescribed Fxs (Delivered / Prescribed): 5 / 5  Narrative:  The patient returns today for a skin check.   During her final weekly treatment check on 10/12/23, the patient endorsed skin redness and an itchy rash to the breast. Physical exam performed that same date showed significant radiation dermatitis and erythema throughout the breast.   The patient presents to the clinic today with complaints of intense and burning within the treatment field.  This has not improved since finishing her final treatment.  The pain has kept her up at night and has caused her to develop headaches.  She has not taken anything for this pain.  She continues to use RadiaPlex as instructed.                               Allergies:  has no known allergies.  Meds: Current Outpatient Medications  Medication Sig Dispense Refill   ALPRAZolam (XANAX) 0.5 MG tablet Take 0.5 mg by mouth at bedtime as needed for anxiety.     Ascorbic Acid  (VITAMIN C PO) Take 1 tablet by mouth daily.     Cholecalciferol (VITAMIN D3) 10 MCG (400 UNIT) CAPS Take 1 tablet by mouth daily.     Cyanocobalamin (VITAMIN B 12 PO) Take 1 tablet by mouth daily.     Multiple Vitamin (MULTIVITAMIN) tablet Take 1 tablet by mouth daily.     Omega 3 1000 MG CAPS Take 1 capsule by mouth daily.     oxyCODONE (OXY IR/ROXICODONE) 5 MG immediate release tablet Take 1 tablet (5 mg total) by mouth every 6 (six) hours as needed for severe pain (pain score 7-10). 25 tablet 0   No current facility-administered medications for this encounter.    Physical Findings: The patient is in no acute distress. Patient is alert and oriented.  height is 5\' 6"  (1.676 m) and weight is 219 lb 12.8 oz (99.7 kg). Her temperature is 97.8 F (36.6 C). Her blood pressure is 156/81 (abnormal) and her pulse is 89. Her respiration is 18 and oxygen saturation is 99%. .  No significant changes. Lungs are clear to auscultation bilaterally. Heart has regular rate and rhythm. No palpable cervical, supraclavicular, or axillary adenopathy. Abdomen soft, non-tender, normal bowel sounds.  Right Breast: no palpable mass, nipple discharge or bleeding. Left Breast: Erythema surrounding skin fold inferior to the left axilla with dry  desquamation within the fold, consistent with radiation dermatitis.  Hyperpigmentation and erythema throughout of the treatment field  no significant moist desquamation.  Lab Findings: Lab Results  Component Value Date   WBC 8.8 07/21/2023   HGB 14.2 07/21/2023   HCT 42.2 07/21/2023   MCV 93.2 07/21/2023   PLT 251 07/21/2023    Radiographic Findings: No results found.  Impression: Left Breast UOQ, Invasive and in situ ductal carcinoma, ER+ / PR+ / Her2-, Grade 2: s/p lumpectomy without SLN evaluation    The patient is unfortunately experiencing significant irritation likely from radiation dermatitis.  Plan: Patient was given hydrogel and Silvadene cream to apply to  the affected area.  Prescription for oxycodone refilled to use as needed for pain.  Will see the patient back on 11/24/2022. She knows to call if symptoms worsen or do not improve.    20 minutes of total time was spent for this patient encounter, including preparation, face-to-face counseling with the patient and coordination of care, physical exam, and documentation of the encounter. ____________________________________   Bryan Lemma, PA-C   Billie Lade, PhD, MD   [facility]  Radiation Oncology Direct Dial: [phone]  Fax: [phone] [email or web address]    This document serves as a record of services personally performed by Antony Blackbird, MD and Bryan Lemma, PA-C. It was created on his behalf by Neena Rhymes, a trained medical scribe. The creation of this record is based on the scribe's personal observations and the provider's statements to them. This document has been checked and approved by the attending provider.

## 2023-10-21 NOTE — Progress Notes (Signed)
  Radiation Oncology         [phone] ________________________________  Name: [name] MRN: [id number]  Date: 10/21/2023  DOB: [birth year]  End of Treatment Note  Diagnosis: Left Breast UOQ, Invasive and in situ ductal carcinoma, ER+ / PR+ / Her2-, Grade 2: s/p lumpectomy without SLN evaluation      Indication for treatment: Curative        Radiation treatment dates: 09/16/23 through 10/15/23   Site/Dose/Technique/Mode:   Site: Breast, Left Technique: 3D Mode: Photon Dose Per Fraction: 2.67 Gy Prescribed Dose (Delivered / Prescribed): 40.05 Gy / 40.05 Gy Prescribed Fxs (Delivered / Prescribed): 15 / 15   Site: Breast, Left Technique: 3D Mode: Photon Dose Per Fraction: 2 Gy Prescribed Dose (Delivered / Prescribed): 10 Gy / 10 Gy Prescribed Fxs (Delivered / Prescribed): 5 / 5  Narrative: The patient tolerated radiation treatment relatively well. During her final weekly treatment check on 10/12/23, the patient endorsed skin redness and an itchy rash on the breast. Physical exam performed that same date showed significant radiation dermatitis and erythema throughout the breast.   Plan: The patient has completed radiation treatment. The patient will return to radiation oncology clinic for routine followup in one month. I advised them to call or return sooner if they have any questions or concerns related to their recovery or treatment.  -----------------------------------  Billie Lade, PhD, MD  This document serves as a record of services personally performed by Antony Blackbird, MD. It was created on his behalf by Neena Rhymes, a trained medical scribe. The creation of this record is based on the scribe's personal observations and the provider's statements to them. This document has been checked and approved by the attending provider.

## 2023-10-21 NOTE — Telephone Encounter (Signed)
[name] called this morning concerning a radiation burn underneath her arm. She stated that the pain is constant and feels like it is on fire.She stated that the gel is not helping. Please advise.

## 2023-10-21 NOTE — Progress Notes (Signed)
[name] is here today for follow up post radiation to the breast.   Breast Side: Left   They completed their radiation on: 10/15/2023  Does the patient complain of any of the following: Post radiation skin issues: Yes, she reports a radiation burn underneath the arm. She reports a lump also. Breast Tenderness: Denies Breast Swelling: Denies Lymphadema: She reports lymphedema underneath arm. Range of Motion limitations: Denies  Fatigue post radiation: Yes Appetite good/fair/poor: Good   BP (!) 156/81 (BP Location: Right Arm, Patient Position: Sitting, Cuff Size: Normal)   Pulse 89   Temp 97.8 F (36.6 C)   Resp 18   Ht 5\' 6"  (1.676 m)   Wt 219 lb 12.8 oz (99.7 kg)   LMP 04/28/2012   SpO2 99%   BMI 35.48 kg/m

## 2023-10-22 ENCOUNTER — Telehealth: Payer: Self-pay | Admitting: Adult Health

## 2023-10-22 ENCOUNTER — Ambulatory Visit: Payer: Self-pay | Admitting: Radiation Oncology

## 2023-10-22 ENCOUNTER — Ambulatory Visit: Payer: Medicare HMO

## 2023-10-25 ENCOUNTER — Ambulatory Visit: Payer: Medicare HMO

## 2023-10-26 ENCOUNTER — Ambulatory Visit: Payer: Medicare HMO | Admitting: Radiation Oncology

## 2023-10-26 ENCOUNTER — Ambulatory Visit: Payer: Medicare HMO

## 2023-10-28 ENCOUNTER — Ambulatory Visit: Payer: Medicare HMO

## 2023-10-29 ENCOUNTER — Ambulatory Visit: Payer: Medicare HMO

## 2023-11-01 ENCOUNTER — Ambulatory Visit: Payer: Medicare HMO

## 2023-11-02 ENCOUNTER — Inpatient Hospital Stay: Payer: Medicare HMO | Attending: Hematology and Oncology | Admitting: Hematology and Oncology

## 2023-11-02 ENCOUNTER — Ambulatory Visit: Payer: Medicare HMO

## 2023-11-02 DIAGNOSIS — Z79811 Long term (current) use of aromatase inhibitors: Secondary | ICD-10-CM | POA: Insufficient documentation

## 2023-11-02 DIAGNOSIS — Z87891 Personal history of nicotine dependence: Secondary | ICD-10-CM | POA: Insufficient documentation

## 2023-11-02 DIAGNOSIS — C50412 Malignant neoplasm of upper-outer quadrant of left female breast: Secondary | ICD-10-CM

## 2023-11-02 DIAGNOSIS — Z170 Estrogen receptor positive status [ER+]: Secondary | ICD-10-CM

## 2023-11-02 DIAGNOSIS — Z923 Personal history of irradiation: Secondary | ICD-10-CM | POA: Insufficient documentation

## 2023-11-02 MED ORDER — ANASTROZOLE 1 MG PO TABS: 1.0000 mg | ORAL_TABLET | Freq: Every day | ORAL | 3 refills | Status: DC

## 2023-11-02 NOTE — Assessment & Plan Note (Addendum)
 This is a very pleasant 72 yr old female with newly diagnosed left breast IDC, ER 100% positive, PR 100% positive, Her 2 neg, Ki 67 20% referred to breast MDC for additional recommendations.  Given small tumor, ER positive, node negativity, we have discussed about oncotype followed by adjuvant radiation and antiestrogen therapy.  Assessment and Plan    Grade 1 Invasive Ductal Carcinoma T1cN0 (1.2 cm), estrogen and progesterone  positive, HER2 negative, tumor at the 12 o'clock position. Patient is well-informed and at peace with the diagnosis. -Status post lumpectomy, negative margins,  -Oncotype score of 8 indicating a very low risk of recurrence (3% at 9 years). Discussed the lack of benefit from chemotherapy in this low-risk group. -She is status post adjuvant radiation, completed it on December 13 th. -Discussed the initiation of Anastrozole , an anti-estrogen medication, to reduce the risk of recurrence. The patient was informed about potential side effects including hot flashes, vaginal dryness, and joint aches, bone density loss.. -Start Anastrozole  1mg  daily from Monday. -Plan for bone density monitoring every 2 years.  Bone Health Discussed potential for accelerated bone loss with Anastrozole . Most recent bone density was normal.  Follow-up with the survivorship clinic in March.

## 2023-11-02 NOTE — Progress Notes (Signed)
 [facility] CONSULT NOTE  Patient Care Team: Samie Frederick, PA-C as PCP - General (Physician Assistant) Kelsie Agent, MD (Inactive) as PCP - Cardiology (Cardiology) Kelsie Agent, MD (Inactive) as Consulting Physician (Cardiology) Octavia Bruckner, MD as Consulting Physician (Ophthalmology) Vanderbilt Ned, MD as Consulting Physician (General Surgery) Loretha Ash, MD as Consulting Physician (Hematology and Oncology) Shannon Agent, MD as Consulting Physician (Radiation Oncology) Tyree Nanetta SAILOR, RN as Oncology Nurse Navigator Glean Stephane BROCKS, RN as Oncology Nurse Navigator  CHIEF COMPLAINTS/PURPOSE OF CONSULTATION:  Newly diagnosed breast cancer  HISTORY OF PRESENTING ILLNESS:  [name] 72 y.o. female is here because of recent diagnosis of left breast IDC.  I reviewed her records extensively and collaborated the history with the patient.  SUMMARY OF ONCOLOGIC HISTORY: Oncology History  Malignant neoplasm of upper-outer quadrant of left breast in female, estrogen receptor positive (HCC)  07/09/2023 Mammogram   Patient had screening mammogram and diagnostic mammogram which confirmed a 1.1 x 1 x 0.8 cm irregular mass in the left breast at 12:00 middle depth 6 cm from the nipple, irregular mass is hypoechoic.  Ultrasound-guided biopsy is recommended.  No significant abnormalities were seen sonographically in the left axilla.   07/13/2023 Pathology Results   Left breast needle core biopsy showed overall grade 1 invasive ductal carcinoma prognostics indicated ER 100% positive strong staining, PR 100% positive strong staining, Ki-67 of 20% and group 5 HER2 negative   07/19/2023 Initial Diagnosis   Malignant neoplasm of upper-outer quadrant of left breast in female, estrogen receptor positive (HCC)   07/21/2023 Cancer Staging   Staging form: Breast, AJCC 8th Edition - Clinical stage from 07/21/2023: Stage IA (cT1c, cN0, cM0, G1, ER+, PR+, HER2-) - Signed by Loretha Ash, MD on 07/21/2023 Stage prefix: Initial diagnosis Histologic grading system: 3 grade system Laterality: Left Staged by: Pathologist and managing physician Stage used in treatment planning: Yes National guidelines used in treatment planning: Yes    Genetic Testing   [facility] CancerNext Panel+RNA was Negative. Of note, a variant of uncertain significance was identified in the MSH2 gene ([id number]). Report date is 07/30/2023.  The CancerNext gene panel offered by [employer] includes sequencing, rearrangement analysis, and RNA analysis for the following 34 genes:   APC, ATM, AXIN2, BARD1, BMPR1A, BRCA1, BRCA2, BRIP1, CDH1, CDK4, CDKN2A, CHEK2, DICER1, HOXB13, EPCAM, GREM1, MLH1, MSH2, MSH3, MSH6, MUTYH, NF1, NTHL1, PALB2, PMS2, POLD1, POLE, PTEN, RAD51C, RAD51D, SMAD4, SMARCA4, STK11, and TP53.     Definitive Surgery   she had left breast lumpectomy, pathology showed grade 2 IDC measuring 1.3 cm, negative margins for both invasive and DCIS.  Prognostics were ER/PR positive HER2 negative Ki-67 of 20% from the previous biopsy Oncotype DX of 8, no benefit from adjuvant chemotherapy she is now status post adjuvant radiation and is here to initiate antiestrogen therapy.    Discussed the use of AI scribe software for clinical note transcription with the patient, who gave verbal consent to proceed.  History of Present Illness    The patient is a 72 year old individual with a recent history of breast cancer, for which she underwent surgery and radiation therapy. The patient completed radiation therapy on December 13th and is due to start anastrozole , an anti-estrogen medication, as part of her ongoing treatment. She expresses concerns about potential side effects of the medication, including hot flashes, dryness in the private parts, aches and pains, and potential effects on bone density and cardiac health. The patient also mentions feeling tired, which she  attributes to recent illness symptoms  including a runny nose and sore throat, as well as the after-effects of her cancer treatments. She expresses anxiety about the cancer returning, influenced by reading about other cancer patients' experiences online.   Rest of the pertinent 10 point ROS reviewed and negative  MEDICAL HISTORY:  Past Medical History:  Diagnosis Date   Acute conjunctivitis of both eyes 04/05/2019   Allergy    Anxiety    Arthritis    Atrial fibrillation (HCC)    Chicken pox    Colon polyp    Essential hypertension 10/03/2018   patient denies diagnosis of afib   Fibroids    Uterine   GERD (gastroesophageal reflux disease)    Heart murmur    History of colon polyps    Hyperlipidemia    Overweight     SURGICAL HISTORY: Past Surgical History:  Procedure Laterality Date   ATRIAL FIBRILLATION ABLATION  01/2023   BREAST LUMPECTOMY WITH RADIOACTIVE SEED LOCALIZATION Left 08/10/2023   Procedure: LEFT BREAST LUMPECTOMY WITH RADIOACTIVE SEED LOCALIZATION;  Surgeon: Vanderbilt Ned, MD;  Location: [facility];  Service: General;  Laterality: Left;   implantable loop recorder placement  12/11/2019    Medtronic Reveal Linq model LNQ 2 ([id number]) implantable loop recorder   implantable loop recorder removal  01/25/2020   MDT Reveal LINQ removed in office due to patient request due to anxiety of having an implanted device,  removed without complication   NOSE SURGERY     ROTATOR CUFF REPAIR     TONSILLECTOMY  1970   WISDOM TOOTH EXTRACTION      SOCIAL HISTORY: Social History   Socioeconomic History   Marital status: Single    Spouse name: Not on file   Number of children: Not on file   Years of education: Not on file   Highest education level: Not on file  Occupational History   Not on file  Tobacco Use   Smoking status: Former    Types: Cigarettes   Smokeless tobacco: Never   Tobacco comments:    smoked off and on for about 25 years  Vaping Use   Vaping status: Never Used   Substance and Sexual Activity   Alcohol use: Yes    Alcohol/week: 0.0 standard drinks of alcohol    Comment: seldom   Drug use: No   Sexual activity: Yes    Partners: Male  Other Topics Concern   Not on file  Social History Narrative   Marital status/children/pets: Single.    Education/employment: retired Photographer:      -smoke alarm in the home:Yes     - wears seatbelt: Yes     - Feels safe in their relationships: Yes   Social Drivers of Corporate Investment Banker Strain: Low Risk  (08/20/2023)   Received from [facility]   Overall Financial Resource Strain (CARDIA)    Difficulty of Paying Living Expenses: Not hard at all  Food Insecurity: No Food Insecurity (08/26/2023)   Hunger Vital Sign    Worried About Running Out of Food in the Last Year: Never true    Ran Out of Food in the Last Year: Never true  Transportation Needs: No Transportation Needs (08/26/2023)   PRAPARE - Administrator, Civil Service (Medical): No    Lack of Transportation (Non-Medical): No  Physical Activity: Insufficiently Active (08/20/2023)   [facility]   Exercise Vital Sign    Days  of Exercise per Week: 1 day    Minutes of Exercise per Session: 20 min  Stress: Stress Concern Present (08/20/2023)   Received from [facility] of Occupational [facility] - Occupational Stress Questionnaire    Feeling of Stress : To some extent  Social Connections: Socially Integrated (08/20/2023)   Received from [facility]   Social Network    How would you rate your social network (family, work, friends)?: Good participation with social networks  Intimate Partner Violence: Not At Risk (08/26/2023)   Humiliation, Afraid, Rape, and Kick questionnaire    Fear of Current or Ex-Partner: No    Emotionally Abused: No    Physically Abused: No    Sexually Abused: No    FAMILY HISTORY: Family History  Problem Relation Age of Onset   Hearing  loss Mother    Macular degeneration Mother    Alzheimer's disease Father    Hypertension Father    Heart disease Father    Lung cancer Sister 55       smoked   Heart disease Brother    Colon cancer Maternal Uncle 79   Ovarian cancer Maternal Grandmother 47   Emphysema Maternal Grandfather    Prostate cancer Paternal Grandfather    Esophageal cancer Neg Hx    Rectal cancer Neg Hx    Breast cancer Neg Hx     ALLERGIES:  has no known allergies.  MEDICATIONS:  Current Outpatient Medications  Medication Sig Dispense Refill   anastrozole  (ARIMIDEX ) 1 MG tablet Take 1 tablet (1 mg total) by mouth daily. 90 tablet 3   ALPRAZolam  (XANAX ) 0.5 MG tablet Take 0.5 mg by mouth at bedtime as needed for anxiety.     Ascorbic Acid (VITAMIN C PO) Take 1 tablet by mouth daily.     Cholecalciferol (VITAMIN D3) 10 MCG (400 UNIT) CAPS Take 1 tablet by mouth daily.     Cyanocobalamin  (VITAMIN B 12 PO) Take 1 tablet by mouth daily.     Multiple Vitamin (MULTIVITAMIN) tablet Take 1 tablet by mouth daily.     Omega 3 1000 MG CAPS Take 1 capsule by mouth daily.     oxyCODONE  (OXY IR/ROXICODONE ) 5 MG immediate release tablet Take 1 tablet (5 mg total) by mouth every 6 (six) hours as needed for severe pain (pain score 7-10). 25 tablet 0   No current facility-administered medications for this visit.    REVIEW OF SYSTEMS:   Constitutional: Denies fevers, chills or abnormal night sweats Eyes: Denies blurriness of vision, double vision or watery eyes Ears, nose, mouth, throat, and face: Denies mucositis or sore throat Respiratory: Denies cough, dyspnea or wheezes Cardiovascular: Denies palpitation, chest discomfort or lower extremity swelling Gastrointestinal:  Denies nausea, heartburn or change in bowel habits Skin: Denies abnormal skin rashes Lymphatics: Denies new lymphadenopathy or easy bruising Neurological:Denies numbness, tingling or new weaknesses Behavioral/Psych: Mood is stable, no new changes   Breast:  Denies any palpable lumps or discharge All other systems were reviewed with the patient and are negative.  PHYSICAL EXAMINATION: ECOG PERFORMANCE STATUS: 0 - Asymptomatic  Vitals:   11/02/23 1121  BP: (!) 121/51  Pulse: 77  Resp: 18  Temp: 97.8 F (36.6 C)  SpO2: 98%   Filed Weights   11/02/23 1121  Weight: 219 lb 3.2 oz (99.4 kg)    GENERAL:alert, no distress and comfortable   LABORATORY DATA:  I have reviewed the data as listed Lab Results  Component Value Date  WBC 8.8 07/21/2023   HGB 14.2 07/21/2023   HCT 42.2 07/21/2023   MCV 93.2 07/21/2023   PLT 251 07/21/2023   Lab Results  Component Value Date   NA 140 07/21/2023   K 4.3 07/21/2023   CL 108 07/21/2023   CO2 29 07/21/2023   Pathology   1. Breast, left, needle core biopsy, 12 o'clock, 6cmfn :       - INVASIVE DUCTAL CARCINOMA, SEE NOTE       - TUBULE FORMATION: SCORE 2       - NUCLEAR PLEOMORPHISM: SCORE 2       - MITOTIC COUNT: SCORE 1       - TOTAL SCORE: 5       - OVERALL GRADE: 1       - LYMPHOVASCULAR INVASION: NOT IDENTIFIED       - CANCER LENGTH: 0.6 CM       - CALCIFICATIONS: NOT IDENTIFIED   Estrogen Receptor:  100%, POSITIVE, STRONG STAINING INTENSITY Progesterone  Receptor:  100%, POSITIVE, STRONG STAINING INTENSITY Proliferation Marker Ki67:  20%  GROUP 5:   HER2 **NEGATIVE**   RADIOGRAPHIC STUDIES: I have personally reviewed the radiological reports and agreed with the findings in the report.  ASSESSMENT AND PLAN:  Malignant neoplasm of upper-outer quadrant of left breast in female, estrogen receptor positive (HCC) This is a very pleasant 72 yr old female with newly diagnosed left breast IDC, ER 100% positive, PR 100% positive, Her 2 neg, Ki 67 20% referred to breast MDC for additional recommendations.  Given small tumor, ER positive, node negativity, we have discussed about oncotype followed by adjuvant radiation and antiestrogen therapy.  Assessment and Plan     Grade 1 Invasive Ductal Carcinoma T1cN0 (1.2 cm), estrogen and progesterone  positive, HER2 negative, tumor at the 12 o'clock position. Patient is well-informed and at peace with the diagnosis. -Status post lumpectomy, negative margins,  -Oncotype score of 8 indicating a very low risk of recurrence (3% at 9 years). Discussed the lack of benefit from chemotherapy in this low-risk group. -She is status post adjuvant radiation, completed it on December 13 th. -Discussed the initiation of Anastrozole , an anti-estrogen medication, to reduce the risk of recurrence. The patient was informed about potential side effects including hot flashes, vaginal dryness, and joint aches, bone density loss.. -Start Anastrozole  1mg  daily from Monday. -Plan for bone density monitoring every 2 years.  Bone Health Discussed potential for accelerated bone loss with Anastrozole . Most recent bone density was normal.  Follow-up with the survivorship clinic in March.        All questions were answered. The patient knows to call the clinic with any problems, questions or concerns.    Amber Stalls, MD 11/03/23

## 2023-11-03 ENCOUNTER — Encounter: Payer: Self-pay | Admitting: Hematology and Oncology

## 2023-11-04 ENCOUNTER — Ambulatory Visit: Payer: Medicare HMO

## 2023-11-10 LAB — HM DEXA SCAN

## 2023-11-23 ENCOUNTER — Encounter: Payer: Self-pay | Admitting: Radiation Oncology

## 2023-11-24 NOTE — Progress Notes (Signed)
  Radiation Oncology         (336) [phone] ________________________________  Name: [name] MRN: [id number]  Date: 11/25/2023  DOB: September 25, 1951  End of Treatment Note  Diagnosis:  Malignant neoplasm of central portion of left female breast Staging on 2023-07-21: Malignant neoplasm of upper-outer quadrant of left breast in female, estrogen receptor positive (HCC) T=cT1c, N=cN0, M=cM0     Indication for treatment:  Curative        Radiation treatment dates:     First Treatment Date: 2023-09-16 Last Treatment Date: 2023-10-15  Site/Dose/Technique/Mode:   Plan Name: Breast_L_BH Site: Breast, Left Technique: 3D Mode: Photon Dose Per Fraction: 2.67 Gy Prescribed Dose (Delivered / Prescribed): 40.05 Gy / 40.05 Gy Prescribed Fxs (Delivered / Prescribed): 15 / 15   Plan Name: Brst_L_Bst_BH Site: Breast, Left Technique: 3D Mode: Photon Dose Per Fraction: 2 Gy Prescribed Dose (Delivered / Prescribed): 10 Gy / 10 Gy Prescribed Fxs (Delivered / Prescribed): 5 / 5   Narrative: The patient tolerated radiation treatment relatively well. She experienced mild fatigue and skin irritation that consisted of red, itchy bumps. Otherwise, doing well.   Plan: The patient has completed radiation treatment. The patient will return to radiation oncology clinic for routine followup in one month. I advised them to call or return sooner if they have any questions or concerns related to their recovery or treatment.  -----------------------------------  Billie Lade, PhD, MD  This document serves as a record of services personally performed by Antony Blackbird, MD. It was created on his behalf by Herbie Saxon, a trained medical scribe. The creation of this record is based on the scribe's personal observations and the provider's statements to them. This document has been checked and approved by the attending provider.

## 2023-11-24 NOTE — Progress Notes (Signed)
Radiation Oncology         (336) [phone] ________________________________  Name: [name] MRN: [id number]  Date: 11/25/2023  DOB: [birth year]  Follow-Up Visit Note  CC: [name], [name], PA-C  No diagnosis found.  Diagnosis:   Malignant neoplasm of central portion of left female breast Staging on 2023-07-21: Malignant neoplasm of upper-outer quadrant of left breast in female, estrogen receptor positive (HCC) T=cT1c, N=cN0, M=cM0   Indication for treatment:  Curative        Interval Since Last Radiation:  1 month  11 days  Radiation treatment dates:      First Treatment Date: 2023-09-16 Last Treatment Date: 2023-10-15   Site/Dose/Technique/Mode:    Plan Name: Breast_L_BH Site: Breast, Left Technique: 3D Mode: Photon Dose Per Fraction: 2.67 Gy Prescribed Dose (Delivered / Prescribed): 40.05 Gy / 40.05 Gy Prescribed Fxs (Delivered / Prescribed): 15 / 15   Plan Name: Brst_L_Bst_BH Site: Breast, Left Technique: 3D Mode: Photon Dose Per Fraction: 2 Gy Prescribed Dose (Delivered / Prescribed): 10 Gy / 10 Gy Prescribed Fxs (Delivered / Prescribed): 5 / 5  Narrative:  The patient returns today for routine follow-up. She was last seen in office on 10-21-23 for a follow-up. At that time, she endorsed intense pain and burning within the treatment field. She continued to use RadiaPlex as instructed. Since then,  she had a follow up with Dr. [name] on 11-02-23. She complained of fatigue but attributes that to a recent illness symptoms that included a runny nose and sore throat. She also expressed anxiety about disease recurrence and concerns about potential side effects from new medication.   She presented for a 51-month post op follow up with Dr. [name]. She was doing well at that time and denied any symptoms or concerns indication disease recurrence.                    No other significant oncologic interval history since the patient was last seen.    Of note: She presented for a DEXA scan on 11-12-23 which did not indicate any abnormalities or need for pharmacological interventions.                           Allergies:  has no known allergies.  Meds: Current Outpatient Medications  Medication Sig Dispense Refill   ALPRAZolam (XANAX) 0.5 MG tablet Take 0.5 mg by mouth at bedtime as needed for anxiety.     anastrozole (ARIMIDEX) 1 MG tablet Take 1 tablet (1 mg total) by mouth daily. 90 tablet 3   Ascorbic Acid (VITAMIN C PO) Take 1 tablet by mouth daily.     Cholecalciferol (VITAMIN D3) 10 MCG (400 UNIT) CAPS Take 1 tablet by mouth daily.     Cyanocobalamin (VITAMIN B 12 PO) Take 1 tablet by mouth daily.     Multiple Vitamin (MULTIVITAMIN) tablet Take 1 tablet by mouth daily.     Omega 3 1000 MG CAPS Take 1 capsule by mouth daily.     oxyCODONE (OXY IR/ROXICODONE) 5 MG immediate release tablet Take 1 tablet (5 mg total) by mouth every 6 (six) hours as needed for severe pain (pain score 7-10). 25 tablet 0   No current facility-administered medications for this encounter.    Physical Findings: The patient is in no acute distress. Patient is alert and oriented.  vitals were not taken for this visit. .  No significant changes. Lungs are clear  to auscultation bilaterally. Heart has regular rate and rhythm. No palpable cervical, supraclavicular, or axillary adenopathy. Abdomen soft, non-tender, normal bowel sounds.   Lab Findings: Lab Results  Component Value Date   WBC 8.8 07/21/2023   HGB 14.2 07/21/2023   HCT 42.2 07/21/2023   MCV 93.2 07/21/2023   PLT 251 07/21/2023    Radiographic Findings: No results found.  Impression:  Malignant neoplasm of central portion of left female breast Staging on 2023-07-21: Malignant neoplasm of upper-outer quadrant of left breast in female, estrogen receptor positive (HCC) T=cT1c, N=cN0, M=cM0   The patient is recovering from the effects of radiation.  ***  Plan:  ***   *** minutes of  total time was spent for this patient encounter, including preparation, face-to-face counseling with the patient and coordination of care, physical exam, and documentation of the encounter. ____________________________________  Billie Lade, PhD, MD  This document serves as a record of services personally performed by Antony Blackbird, MD. It was created on his behalf by Herbie Saxon, a trained medical scribe. The creation of this record is based on the scribe's personal observations and the provider's statements to them. This document has been checked and approved by the attending provider.

## 2023-11-25 ENCOUNTER — Encounter: Payer: Self-pay | Admitting: Radiation Oncology

## 2023-11-25 ENCOUNTER — Ambulatory Visit
Admission: RE | Admit: 2023-11-25 | Discharge: 2023-11-25 | Disposition: A | Discharge status: Home / Self Care | Payer: Medicare HMO | Source: Ambulatory Visit | Attending: Radiation Oncology | Admitting: Radiation Oncology

## 2023-11-25 VITALS — BP 130/82 | HR 77 | Temp 98.0°F | Resp 18 | Ht 66.0 in | Wt 223.5 lb

## 2023-11-25 DIAGNOSIS — Z923 Personal history of irradiation: Secondary | ICD-10-CM | POA: Diagnosis not present

## 2023-11-25 DIAGNOSIS — Z170 Estrogen receptor positive status [ER+]: Secondary | ICD-10-CM | POA: Insufficient documentation

## 2023-11-25 DIAGNOSIS — C50412 Malignant neoplasm of upper-outer quadrant of left female breast: Secondary | ICD-10-CM | POA: Insufficient documentation

## 2023-11-25 DIAGNOSIS — C50112 Malignant neoplasm of central portion of left female breast: Secondary | ICD-10-CM | POA: Diagnosis present

## 2023-11-25 HISTORY — DX: Personal history of irradiation: Z92.3

## 2023-11-25 NOTE — Progress Notes (Signed)
[name] is here today for follow up post radiation to the breast.   Breast Side:Left   They completed their radiation on: 10/15/23   Does the patient complain of any of the following: Post radiation skin issues:  Patient reports hardened area to surgical incision to left breast.  Breast Tenderness:  Reports having twinges to breast at times.  Breast Swelling: No Lymphadema: No Range of Motion limitations: no Fatigue post radiation: Yes at times.  Appetite good/fair/poor: Good   Additional comments if applicable:  Patient started taking anastrozole on 11/02/23.  BP 130/82 (BP Location: Left Arm, Patient Position: Sitting)   Pulse 77   Temp 98 F (36.7 C) (Temporal)   Resp 18   Ht 5\' 6"  (1.676 m)   Wt 223 lb 8 oz (101.4 kg)   LMP 04/28/2012   SpO2 100%   BMI 36.07 kg/m

## 2023-12-09 ENCOUNTER — Telehealth: Payer: Self-pay

## 2023-12-09 NOTE — Telephone Encounter (Signed)
 Extensive conversation with pt today regarding care moving forward. She understands she will be seeing NP moving forward for survivorship careplan, unless there is a need for oncologist visit. She is in agreement with this. She is asking about eye brow tattooing and would like to know if this is something she is able to do now.  She is requesting a call back from [place], [place].

## 2024-01-04 ENCOUNTER — Encounter: Payer: Self-pay | Admitting: *Deleted

## 2024-01-04 NOTE — Progress Notes (Signed)
 Called pt to confirm [facility] appt. Pt is looking forward to coming, doesn't want to reschedule. She is looking forward to asking her questions abt weight loss and dietary habits as she has shared in conversation, she eats too many sweets. I will make sure I have some materials about our different resources for her on Monday.

## 2024-01-06 ENCOUNTER — Other Ambulatory Visit: Payer: Self-pay

## 2024-01-06 DIAGNOSIS — Z170 Estrogen receptor positive status [ER+]: Secondary | ICD-10-CM

## 2024-01-10 ENCOUNTER — Inpatient Hospital Stay: Payer: Medicare HMO | Attending: Hematology and Oncology | Admitting: Adult Health

## 2024-01-10 ENCOUNTER — Encounter: Payer: Self-pay | Admitting: Adult Health

## 2024-01-10 ENCOUNTER — Telehealth: Payer: Self-pay | Admitting: *Deleted

## 2024-01-10 ENCOUNTER — Other Ambulatory Visit: Payer: Self-pay | Admitting: *Deleted

## 2024-01-10 ENCOUNTER — Inpatient Hospital Stay: Payer: Medicare HMO

## 2024-01-10 VITALS — BP 135/74 | HR 72 | Temp 97.5°F | Resp 18 | Ht 66.0 in | Wt 224.2 lb

## 2024-01-10 DIAGNOSIS — Z923 Personal history of irradiation: Secondary | ICD-10-CM | POA: Diagnosis not present

## 2024-01-10 DIAGNOSIS — Z87891 Personal history of nicotine dependence: Secondary | ICD-10-CM | POA: Insufficient documentation

## 2024-01-10 DIAGNOSIS — Z1721 Progesterone receptor positive status: Secondary | ICD-10-CM | POA: Diagnosis not present

## 2024-01-10 DIAGNOSIS — Z79899 Other long term (current) drug therapy: Secondary | ICD-10-CM | POA: Insufficient documentation

## 2024-01-10 DIAGNOSIS — C50412 Malignant neoplasm of upper-outer quadrant of left female breast: Secondary | ICD-10-CM | POA: Diagnosis not present

## 2024-01-10 DIAGNOSIS — Z170 Estrogen receptor positive status [ER+]: Secondary | ICD-10-CM | POA: Diagnosis not present

## 2024-01-10 DIAGNOSIS — Z79811 Long term (current) use of aromatase inhibitors: Secondary | ICD-10-CM | POA: Diagnosis not present

## 2024-01-10 DIAGNOSIS — Z1732 Human epidermal growth factor receptor 2 negative status: Secondary | ICD-10-CM | POA: Insufficient documentation

## 2024-01-10 LAB — CBC WITH DIFFERENTIAL (CANCER CENTER ONLY)
Abs Immature Granulocytes: 0.03 10*3/uL (ref 0.00–0.07)
Basophils Absolute: 0.1 10*3/uL (ref 0.0–0.1)
Basophils Relative: 1 %
Eosinophils Absolute: 0.1 10*3/uL (ref 0.0–0.5)
Eosinophils Relative: 2 %
HCT: 42.3 % (ref 36.0–46.0)
Hemoglobin: 14 g/dL (ref 12.0–15.0)
Immature Granulocytes: 0 %
Lymphocytes Relative: 23 %
Lymphs Abs: 1.7 10*3/uL (ref 0.7–4.0)
MCH: 29.9 pg (ref 26.0–34.0)
MCHC: 33.1 g/dL (ref 30.0–36.0)
MCV: 90.2 fL (ref 80.0–100.0)
Monocytes Absolute: 0.5 10*3/uL (ref 0.1–1.0)
Monocytes Relative: 7 %
Neutro Abs: 4.8 10*3/uL (ref 1.7–7.7)
Neutrophils Relative %: 67 %
Platelet Count: 273 10*3/uL (ref 150–400)
RBC: 4.69 MIL/uL (ref 3.87–5.11)
RDW: 12.3 % (ref 11.5–15.5)
WBC Count: 7.2 10*3/uL (ref 4.0–10.5)
nRBC: 0 % (ref 0.0–0.2)

## 2024-01-10 LAB — CMP (CANCER CENTER ONLY)
ALT: 15 U/L (ref 0–44)
AST: 14 U/L — ABNORMAL LOW (ref 15–41)
Albumin: 4 g/dL (ref 3.5–5.0)
Alkaline Phosphatase: 73 U/L (ref 38–126)
Anion gap: 5 (ref 5–15)
BUN: 14 mg/dL (ref 8–23)
CO2: 29 mmol/L (ref 22–32)
Calcium: 9.2 mg/dL (ref 8.9–10.3)
Chloride: 108 mmol/L (ref 98–111)
Creatinine: 0.92 mg/dL (ref 0.44–1.00)
GFR, Estimated: >60 mL/min (ref >60)
Glucose, Bld: 105 mg/dL — ABNORMAL HIGH (ref 70–99)
Potassium: 4.3 mmol/L (ref 3.5–5.1)
Sodium: 142 mmol/L (ref 135–145)
Total Bilirubin: 0.4 mg/dL (ref 0.0–1.2)
Total Protein: 7 g/dL (ref 6.5–8.1)

## 2024-01-10 LAB — TSH: TSH: 2.362 u[IU]/mL (ref 0.350–4.500)

## 2024-01-10 NOTE — Progress Notes (Signed)
 Mammogram orders faxed to [facility] with receipt of confirmation

## 2024-01-10 NOTE — Progress Notes (Signed)
 SURVIVORSHIP VISIT:  BRIEF ONCOLOGIC HISTORY:  Oncology History  Malignant neoplasm of upper-outer quadrant of left breast in female, estrogen receptor positive (HCC)  07/09/2023 Mammogram   Patient had screening mammogram and diagnostic mammogram which confirmed a 1.1 x 1 x 0.8 cm irregular mass in the left breast at 12:00 middle depth 6 cm from the nipple, irregular mass is hypoechoic.  Ultrasound-guided biopsy is recommended.  No significant abnormalities were seen sonographically in the left axilla.   07/13/2023 Pathology Results   Left breast needle core biopsy showed overall grade 1 invasive ductal carcinoma prognostics indicated ER 100% positive strong staining, PR 100% positive strong staining, Ki-67 of 20% and group 5 HER2 negative   07/19/2023 Initial Diagnosis   Malignant neoplasm of upper-outer quadrant of left breast in female, estrogen receptor positive (HCC)   07/21/2023 Cancer Staging   Staging form: Breast, AJCC 8th Edition - Clinical stage from 07/21/2023: Stage IA (cT1c, cN0, cM0, G1, ER+, PR+, HER2-) - Signed by [name], MD on 07/21/2023 Stage prefix: Initial diagnosis Histologic grading system: 3 grade system Laterality: Left Staged by: Pathologist and managing physician Stage used in treatment planning: Yes National guidelines used in treatment planning: Yes    Genetic Testing   [facility] CancerNext Panel+RNA was Negative. Of note, a variant of uncertain significance was identified in the MSH2 gene ([id number]). Report date is 07/30/2023.  The CancerNext gene panel offered by [employer] includes sequencing, rearrangement analysis, and RNA analysis for the following 34 genes:   APC, ATM, AXIN2, BARD1, BMPR1A, BRCA1, BRCA2, BRIP1, CDH1, CDK4, CDKN2A, CHEK2, DICER1, HOXB13, EPCAM, GREM1, MLH1, MSH2, MSH3, MSH6, MUTYH, NF1, NTHL1, PALB2, PMS2, POLD1, POLE, PTEN, RAD51C, RAD51D, SMAD4, SMARCA4, STK11, and TP53.     Definitive Surgery   she had left breast lumpectomy,  pathology showed grade 2 IDC measuring 1.3 cm, negative margins for both invasive and DCIS.  Prognostics were ER/PR positive HER2 negative Ki-67 of 20% from the previous biopsy Oncotype DX of 8, no benefit from adjuvant chemotherapy she is now status post adjuvant radiation and is here to initiate antiestrogen therapy.   09/16/2023 - 10/15/2023 Radiation Therapy   Plan Name: Breast_L_BH Site: Breast, Left Technique: 3D Mode: Photon Dose Per Fraction: 2.67 Gy Prescribed Dose (Delivered / Prescribed): 40.05 Gy / 40.05 Gy Prescribed Fxs (Delivered / Prescribed): 15 / 15   Plan Name: Brst_L_Bst_BH Site: Breast, Left Technique: 3D Mode: Photon Dose Per Fraction: 2 Gy Prescribed Dose (Delivered / Prescribed): 10 Gy / 10 Gy Prescribed Fxs (Delivered / Prescribed): 5 / 5   11/2023 -  Anti-estrogen oral therapy   1 mg Anastrozole      INTERVAL HISTORY:  [name] to review her survivorship care plan detailing her treatment course for breast cancer, as well as monitoring long-term side effects of that treatment, education regarding health maintenance, screening, and overall wellness and health promotion.     Overall, [name] reports feeling quite well.  She is taking anastrozole daily and tolerates it moderately well.  She tells me that she quit smoking cigarettes in 2013.  She had a 20.25-pack-year history smoking three fourths of a pack a day for 27 years.  She is very interested and motivated around weight loss and wants to work to improve her weight.  REVIEW OF SYSTEMS:  Review of Systems  Constitutional:  Negative for appetite change, chills, fatigue, fever and unexpected weight change.  HENT:   Negative for hearing loss, lump/mass and trouble swallowing.   Eyes:  Negative for eye problems and icterus.  Respiratory:  Negative for chest tightness, cough and shortness of breath.   Cardiovascular:  Negative for chest pain, leg swelling and palpitations.  Gastrointestinal:  Negative  for abdominal distention, abdominal pain, constipation, diarrhea, nausea and vomiting.  Endocrine: Negative for hot flashes.  Genitourinary:  Negative for difficulty urinating.   Musculoskeletal:  Negative for arthralgias.  Skin:  Negative for itching and rash.  Neurological:  Negative for dizziness, extremity weakness, headaches and numbness.  Hematological:  Negative for adenopathy. Does not bruise/bleed easily.  Psychiatric/Behavioral:  Negative for depression. The patient is not nervous/anxious.    Breast: Denies any new nodularity, masses, tenderness, nipple changes, or nipple discharge.       PAST MEDICAL/SURGICAL HISTORY:  Past Medical History:  Diagnosis Date   Acute conjunctivitis of both eyes 04/05/2019   Allergy    Anxiety    Arthritis    Atrial fibrillation (HCC)    Chicken pox    Colon polyp    Essential hypertension 10/03/2018   patient denies diagnosis of afib   Fibroids    Uterine   GERD (gastroesophageal reflux disease)    Heart murmur    History of colon polyps    History of radiation therapy    Left breast- 09/16/23-10/15/23- Dr. Antony Blackbird   Hyperlipidemia    Overweight    Past Surgical History:  Procedure Laterality Date   ATRIAL FIBRILLATION ABLATION  01/2023   BREAST LUMPECTOMY WITH RADIOACTIVE SEED LOCALIZATION Left 08/10/2023   Procedure: LEFT BREAST LUMPECTOMY WITH RADIOACTIVE SEED LOCALIZATION;  Surgeon: Harriette Bouillon, MD;  Location: [facility];  Service: General;  Laterality: Left;   implantable loop recorder placement  12/11/2019    Medtronic Reveal Linq model LNQ 2 ([id number]) implantable loop recorder   implantable loop recorder removal  01/25/2020   MDT Reveal LINQ removed in office due to patient request due to anxiety of having an implanted device,  removed without complication   NOSE SURGERY     ROTATOR CUFF REPAIR     TONSILLECTOMY  1970   WISDOM TOOTH EXTRACTION       ALLERGIES:  No Known  Allergies   CURRENT MEDICATIONS:  Outpatient Encounter Medications as of 01/10/2024  Medication Sig   ALPRAZolam (XANAX) 0.5 MG tablet Take 0.5 mg by mouth at bedtime as needed for anxiety.   anastrozole (ARIMIDEX) 1 MG tablet Take 1 tablet (1 mg total) by mouth daily.   Ascorbic Acid (VITAMIN C PO) Take 1 tablet by mouth daily.   Biotin 10 MG TABS Take 10 mg by mouth as needed (switch up with meds).   Cholecalciferol (VITAMIN D3) 10 MCG (400 UNIT) CAPS Take 1 tablet by mouth daily.   Cyanocobalamin (VITAMIN B 12 PO) Take 1 tablet by mouth daily.   Multiple Vitamin (MULTIVITAMIN) tablet Take 1 tablet by mouth daily.   Omega 3 1000 MG CAPS Take 1 capsule by mouth daily.   Vitamin D, Ergocalciferol, (DRISDOL) 1.25 MG (50000 UNIT) CAPS capsule Take 50,000 Units by mouth every 7 (seven) days.   oxyCODONE (OXY IR/ROXICODONE) 5 MG immediate release tablet Take 1 tablet (5 mg total) by mouth every 6 (six) hours as needed for severe pain (pain score 7-10). (Patient not taking: Reported on 01/10/2024)   No facility-administered encounter medications on file as of 01/10/2024.     ONCOLOGIC FAMILY HISTORY:  Family History  Problem Relation Age of Onset   Hearing loss Mother    Macular degeneration  Mother    Alzheimer's disease Father    Hypertension Father    Heart disease Father    Lung cancer Sister 21       smoked   Heart disease Brother    Colon cancer Maternal Uncle 66   Ovarian cancer Maternal Grandmother 64   Emphysema Maternal Grandfather    Prostate cancer Paternal Grandfather    Esophageal cancer Neg Hx    Rectal cancer Neg Hx    Breast cancer Neg Hx      SOCIAL HISTORY:  Social History   Socioeconomic History   Marital status: Single    Spouse name: Not on file   Number of children: Not on file   Years of education: Not on file   Highest education level: Not on file  Occupational History   Not on file  Tobacco Use   Smoking status: Former    Types: Cigarettes    Smokeless tobacco: Never   Tobacco comments:    smoked off and on for about 25 years  Vaping Use   Vaping status: Never Used  Substance and Sexual Activity   Alcohol use: Yes    Alcohol/week: 0.0 standard drinks of alcohol    Comment: seldom   Drug use: No   Sexual activity: Yes    Partners: Male  Other Topics Concern   Not on file  Social History Narrative   Marital status/children/pets: Single.    Education/employment: retired Photographer:      -smoke alarm in the home:Yes     - wears seatbelt: Yes     - Feels safe in their relationships: Yes   Social Drivers of Corporate investment banker Strain: Low Risk  (08/20/2023)   Received from [facility]   Overall Financial Resource Strain (CARDIA)    Difficulty of Paying Living Expenses: Not hard at all  Food Insecurity: No Food Insecurity (08/26/2023)   Hunger Vital Sign    Worried About Running Out of Food in the Last Year: Never true    Ran Out of Food in the Last Year: Never true  Transportation Needs: No Transportation Needs (08/26/2023)   PRAPARE - Administrator, Civil Service (Medical): No    Lack of Transportation (Non-Medical): No  Physical Activity: Insufficiently Active (08/20/2023)   Received from [facility]   Exercise Vital Sign    Days of Exercise per Week: 1 day    Minutes of Exercise per Session: 20 min  Stress: Stress Concern Present (08/20/2023)   Received from [facility] of Occupational Health - Occupational Stress Questionnaire    Feeling of Stress : To some extent  Social Connections: Socially Integrated (08/20/2023)   Received from [facility]   Social Network    How would you rate your social network (family, work, friends)?: Good participation with social networks  Intimate Partner Violence: Not At Risk (08/26/2023)   Humiliation, Afraid, Rape, and Kick questionnaire    Fear of Current or Ex-Partner: No    Emotionally Abused: No     Physically Abused: No    Sexually Abused: No     OBSERVATIONS/OBJECTIVE:  BP 135/74 (BP Location: Left Arm, Patient Position: Sitting)   Pulse 72   Temp (!) 97.5 F (36.4 C) (Temporal)   Resp 18   Ht 5\' 6"  (1.676 m)   Wt 224 lb 3.2 oz (101.7 kg)   LMP 04/28/2012   SpO2 100%   BMI 36.19 kg/m  GENERAL: Patient is a well appearing female in no acute distress HEENT:  Sclerae anicteric.  Oropharynx clear and moist. No ulcerations or evidence of oropharyngeal candidiasis. Neck is supple.  NODES:  No cervical, supraclavicular, or axillary lymphadenopathy palpated.  BREAST EXAM: Left breast status postlumpectomy and radiation no sign of local recurrence right breast is benign LUNGS:  Clear to auscultation bilaterally.  No wheezes or rhonchi. HEART:  Regular rate and rhythm. No murmur appreciated. ABDOMEN:  Soft, nontender.  Positive, normoactive bowel sounds. No organomegaly palpated. MSK:  No focal spinal tenderness to palpation. Full range of motion bilaterally in the upper extremities. EXTREMITIES:  No peripheral edema.   SKIN:  Clear with no obvious rashes or skin changes. No nail dyscrasia. NEURO:  Nonfocal. Well oriented.  Appropriate affect.   LABORATORY DATA:  None for this visit.  DIAGNOSTIC IMAGING:  None for this visit.      ASSESSMENT AND PLAN:  Ms.. Frazier is a pleasant 73 y.o. female with Stage IA left breast invasive ductal carcinoma, ER+/PR+/HER2-, diagnosed in 07/2023, treated with lumpectomy, adjuvant radiation therapy, and anti-estrogen therapy with Anastrozole beginning in 11/2023.  She presents to the Survivorship Clinic for our initial meeting and routine follow-up post-completion of treatment for breast cancer.    1. Stage IA left breast cancer:  [name] is continuing to recover from definitive treatment for breast cancer. She will follow-up with her medical oncologist, Dr. Al Pimple in 6 months with history and physical exam per surveillance protocol.  She will  continue her anti-estrogen therapy with Anastrozole. Thus far, she is tolerating the Anastrozole well, with minimal side effects. Her mammogram is due 07/2024; orders placed today.   Today, a comprehensive survivorship care plan and treatment summary was reviewed with the patient today detailing her breast cancer diagnosis, treatment course, potential late/long-term effects of treatment, appropriate follow-up care with recommendations for the future, and patient education resources.  A copy of this summary, along with a letter will be sent to the patient's primary care provider via mail/fax/In Basket message after today's visit.    2. Lung cancer screening I discussed screening Low Dose Ct chest without contrast for early detection of lung cancer in this Counseling and Shared Decision-Making Visit Patient Nykayla Marcelli with 1951/01/30 and Age 32 y.o. years met the following criteria and I counseled that in  A) age 14-75 AND B) smoking history of 20 pack year smoking AND C) Current smoker or one who has quit smoking within the last 15 years I counseled - Annual low dose CT chest can pick up lung cancer early and has potential to save lives and cure lung cancer - This is similar in concept to screening mammogram, colonoscopies and pap smears - I explained Ct scan chest is low dose radiation CT chest - I explained early lung cancer asymptomatic and only way to  detect is CT  With the real advantage that early lung cancer is curable through radiation or surgery - I explained CT superior to CXR - I explained that false positives are present and can incur cost and workup like biopsies, additional scan but benefit outweighs risk - I counseled that patient should continue to abstain from smoking - I counseled that patient should adhered to protocol requirements of scan and followup scans  3. Weight concerns: Reviewed recommendations from the [employer] of lifestyle medicine around healthy diet,  exercise, sleep, stress management, social connectedness, and avoiding risky substances.  We discussed weight loss and I gave  her the names of the [place] wellness clinic and [place] healthy weight and wellness clinic to get her weight loss jumpstarted and followed more closely.  4. Bone health:  Given Ms. Couzens's age/history of breast cancer and her current treatment regimen including anti-estrogen therapy with Anastrozole, she is at risk for bone demineralization.  Her last DEXA scan was 11/11/2023 and was normal.  Repeat is recommended in 2 years time.  She was given education on specific activities to promote bone health.  5. Cancer screening:  Due to Ms. Clausen's history and her age, she should receive screening for skin cancers, colon cancer, and gynecologic cancers.  The information and recommendations are listed on the patient's comprehensive care plan/treatment summary and were reviewed in detail with the patient.    6. Health maintenance and wellness promotion: Ms. Waldorf was encouraged to consume 5-7 servings of fruits and vegetables per day. We reviewed the "Nutrition Rainbow" handout.  She was also encouraged to engage in moderate to vigorous exercise for 30 minutes per day most days of the week.  She was instructed to limit her alcohol consumption and continue to abstain from tobacco use.     7. Support services/counseling: It is not uncommon for this period of the patient's cancer care trajectory to be one of many emotions and stressors.   She was given information regarding our available services and encouraged to contact me with any questions or for help enrolling in any of our support group/programs.    Follow up instructions:    -Return to cancer center in 6 months for f/u with Dr. Al Pimple  -Mammogram due in 07/2024 -CT lung cancer screening ordered -DEXA due in 11/2025 -She is welcome to return back to the Survivorship Clinic at any time; no additional follow-up needed at this time.   -Consider referral back to survivorship as a long-term survivor for continued surveillance  The patient was provided an opportunity to ask questions and all were answered. The patient agreed with the plan and demonstrated an understanding of the instructions.   Total encounter time:40 minutes*in face-to-face visit time, chart review, lab review, care coordination, order entry, and documentation of the encounter time.    Lillard Anes, NP 01/10/24 10:58 AM Medical Oncology and Hematology [facility] [address] [place], [place] [address] Tel. [phone]    Fax. [phone]  *Total Encounter Time as defined by the Centers for Medicare and Medicaid Services includes, in addition to the face-to-face time of a patient visit (documented in the note above) non-face-to-face time: obtaining and reviewing outside history, ordering and reviewing medications, tests or procedures, care coordination (communications with other health care professionals or caregivers) and documentation in the medical record.

## 2024-01-25 ENCOUNTER — Telehealth: Payer: Self-pay

## 2024-01-25 NOTE — Telephone Encounter (Signed)
 Patient called in requesting to speak with Dr. Roselind Messier about risk of reoccurrence since she stopped Anastrozole.

## 2024-01-26 ENCOUNTER — Telehealth: Payer: Self-pay | Admitting: Radiation Oncology

## 2024-01-26 NOTE — Telephone Encounter (Signed)
 Today I spoke with the patient concerning her management.  She has decided to stop taking anastrozole.  She reported difficulties with sleeping as well as leg pain as one of the reasons for stopping the medication.  I recommended she discuss further with Dr. Al Pimple.  She also reports that she will be seeing an oncologist in [facility] for further discussion of the potential benefits and risks of adjuvant hormonal therapy.  Antony Blackbird, M.D.

## 2024-01-31 ENCOUNTER — Ambulatory Visit (HOSPITAL_COMMUNITY)
Admission: RE | Admit: 2024-01-31 | Discharge: 2024-01-31 | Disposition: A | Discharge status: Home / Self Care | Source: Ambulatory Visit | Attending: Adult Health | Admitting: Adult Health

## 2024-01-31 DIAGNOSIS — Z87891 Personal history of nicotine dependence: Secondary | ICD-10-CM | POA: Diagnosis present

## 2024-02-02 NOTE — Telephone Encounter (Signed)
 No entry

## 2024-02-09 ENCOUNTER — Encounter: Payer: Self-pay | Admitting: Hematology & Oncology

## 2024-02-09 ENCOUNTER — Other Ambulatory Visit

## 2024-02-09 ENCOUNTER — Encounter: Admitting: Medical Oncology

## 2024-02-09 ENCOUNTER — Inpatient Hospital Stay: Attending: Hematology and Oncology

## 2024-02-09 ENCOUNTER — Inpatient Hospital Stay: Admitting: Hematology & Oncology

## 2024-02-09 VITALS — BP 106/75 | HR 73 | Temp 97.7°F | Resp 18 | Ht 66.0 in | Wt 224.2 lb

## 2024-02-09 DIAGNOSIS — Z170 Estrogen receptor positive status [ER+]: Secondary | ICD-10-CM

## 2024-02-09 DIAGNOSIS — Z853 Personal history of malignant neoplasm of breast: Secondary | ICD-10-CM | POA: Diagnosis present

## 2024-02-09 DIAGNOSIS — Z08 Encounter for follow-up examination after completed treatment for malignant neoplasm: Secondary | ICD-10-CM | POA: Insufficient documentation

## 2024-02-09 DIAGNOSIS — C50412 Malignant neoplasm of upper-outer quadrant of left female breast: Secondary | ICD-10-CM

## 2024-02-09 LAB — CBC WITH DIFFERENTIAL (CANCER CENTER ONLY)
Abs Immature Granulocytes: 0.02 10*3/uL (ref 0.00–0.07)
Basophils Absolute: 0.1 10*3/uL (ref 0.0–0.1)
Basophils Relative: 1 %
Eosinophils Absolute: 0.1 10*3/uL (ref 0.0–0.5)
Eosinophils Relative: 1 %
HCT: 42.6 % (ref 36.0–46.0)
Hemoglobin: 14.4 g/dL (ref 12.0–15.0)
Immature Granulocytes: 0 %
Lymphocytes Relative: 22 %
Lymphs Abs: 1.8 10*3/uL (ref 0.7–4.0)
MCH: 30.5 pg (ref 26.0–34.0)
MCHC: 33.8 g/dL (ref 30.0–36.0)
MCV: 90.3 fL (ref 80.0–100.0)
Monocytes Absolute: 0.6 10*3/uL (ref 0.1–1.0)
Monocytes Relative: 8 %
Neutro Abs: 5.4 10*3/uL (ref 1.7–7.7)
Neutrophils Relative %: 68 %
Platelet Count: 264 10*3/uL (ref 150–400)
RBC: 4.72 MIL/uL (ref 3.87–5.11)
RDW: 12.9 % (ref 11.5–15.5)
WBC Count: 7.9 10*3/uL (ref 4.0–10.5)
nRBC: 0 % (ref 0.0–0.2)

## 2024-02-09 LAB — CMP (CANCER CENTER ONLY)
ALT: 11 U/L (ref 0–44)
AST: 15 U/L (ref 15–41)
Albumin: 4.4 g/dL (ref 3.5–5.0)
Alkaline Phosphatase: 63 U/L (ref 38–126)
Anion gap: 8 (ref 5–15)
BUN: 15 mg/dL (ref 8–23)
CO2: 28 mmol/L (ref 22–32)
Calcium: 9.9 mg/dL (ref 8.9–10.3)
Chloride: 102 mmol/L (ref 98–111)
Creatinine: 0.9 mg/dL (ref 0.44–1.00)
GFR, Estimated: >60 mL/min (ref >60)
Glucose, Bld: 100 mg/dL — ABNORMAL HIGH (ref 70–99)
Potassium: 4 mmol/L (ref 3.5–5.1)
Sodium: 138 mmol/L (ref 135–145)
Total Bilirubin: 0.4 mg/dL (ref 0.0–1.2)
Total Protein: 7.1 g/dL (ref 6.5–8.1)

## 2024-02-09 LAB — LACTATE DEHYDROGENASE: LDH: 123 U/L (ref 98–192)

## 2024-02-09 NOTE — Progress Notes (Signed)
 Hematology and Oncology Follow Up Visit  [name] [id number] 1951/09/01 73 y.o. 02/09/2024   Principle Diagnosis:  Stage 1A (T1cN0M0) infiltrating ductal carcinoma of the left breast - ER+/PR+/HER2- --Oncotype = 8  Current Therapy:   Status post left lumpectomy on 07/13/2023 Status post radiotherapy-completed in 10/22/2023 Patient wishes not to take any aromatase inhibitors.     Interim History:  [name] is back for her first office visit.  She had been seen at the [facility].  However, she lives close to our office.  Logistically, is a lot easier for her to be followed up at our office.  She is doing well.  She is no longer on Arimidex.  She took a self off the Arimidex.  She is trying to lose weight.  She is on Ozempic to try to lose weight.  She does worry about the possibly of her cancer coming back.  I really think that the chance of her cancer coming back is probably can be less than 10%.  She has had no problems with cough or shortness of breath.  There has been no hot flashes.  She has had no sweats.  She has had no rashes.  She has had no change in bowel or bladder habits.  She has had no leg swelling.  She has lost a little weight while taking the Ozempic.  She is trying to watch her diet closely.  She is very much attuned to her specific needs.  She just feels that she would not benefit from the aromatase inhibitor.  She is worried about some of the side effects.  She comes in with her husband.  It is a lot of fun talking to both of them.  She has had no issues with COVID.  Currently, I would have to say that her performance status is probably ECOG 0.  Medications:  Current Outpatient Medications:    ALPRAZolam (XANAX) 0.5 MG tablet, Take 0.5 mg by mouth at bedtime as needed for anxiety., Disp: , Rfl:    Ascorbic Acid (VITAMIN C PO), Take 1 tablet by mouth daily., Disp: , Rfl:    Biotin 10 MG TABS, Take 10 mg by mouth as needed (switch up  with meds)., Disp: , Rfl:    Calcium-Magnesium-Vitamin D (CALCIUM MAGNESIUM PO), Take 1 tablet by mouth daily., Disp: , Rfl:    Cholecalciferol (VITAMIN D3) 10 MCG (400 UNIT) CAPS, Take 1 tablet by mouth daily., Disp: , Rfl:    co-enzyme Q-10 30 MG capsule, Take 30 mg by mouth once a week., Disp: , Rfl:    Cyanocobalamin (VITAMIN B 12 PO), Take 1 tablet by mouth daily., Disp: , Rfl:    Multiple Vitamin (MULTIVITAMIN) tablet, Take 1 tablet by mouth daily., Disp: , Rfl:    Omega 3 1000 MG CAPS, Take 1 capsule by mouth daily., Disp: , Rfl:    UNABLE TO FIND, Inject 1 mg as directed once a week. Med Name: Semiglutide that is Compounded. Gets at [facility] Disp: , Rfl:    Vitamin D, Ergocalciferol, (DRISDOL) 1.25 MG (50000 UNIT) CAPS capsule, Take 50,000 Units by mouth every 7 (seven) days., Disp: , Rfl:    vitamin E 1000 UNIT capsule, Take 1,000 Units by mouth daily., Disp: , Rfl:    anastrozole (ARIMIDEX) 1 MG tablet, Take 1 tablet (1 mg total) by mouth daily. (Patient not taking: Reported on 02/09/2024), Disp: 90 tablet, Rfl: 3  Allergies: No Known Allergies  Past Medical History,  Surgical history, Social history, and Family History were reviewed and updated.  Review of Systems: Review of Systems  Constitutional: Negative.   HENT:  Negative.    Eyes: Negative.   Respiratory: Negative.    Cardiovascular: Negative.   Gastrointestinal: Negative.   Endocrine: Negative.   Genitourinary: Negative.    Musculoskeletal: Negative.   Skin: Negative.   Neurological: Negative.   Hematological: Negative.   Psychiatric/Behavioral: Negative.      Physical Exam:  height is 5\' 6"  (1.676 m) and weight is 224 lb 3.2 oz (101.7 kg). Her oral temperature is 97.7 F (36.5 C). Her blood pressure is 106/75 and her pulse is 73. Her respiration is 18 and oxygen saturation is 99%.   Wt Readings from Last 3 Encounters:  02/09/24 224 lb 3.2 oz (101.7 kg)  01/10/24 224 lb 3.2 oz (101.7 kg)  11/25/23 223 lb 8 oz  (101.4 kg)    Physical Exam Vitals reviewed.  Constitutional:      Comments: Her breast exam shows right breast with no masses, edema or erythema.  There is no right axillary adenopathy.  Left breast shows the well-healed lumpectomy scar at about the 1 o'clock position.  She has a little firmness at the lumpectomy site.  There is no adenopathy in the left axilla.  There is no left nipple discharge.  HENT:     Head: Normocephalic and atraumatic.  Eyes:     Pupils: Pupils are equal, round, and reactive to light.  Cardiovascular:     Rate and Rhythm: Normal rate and regular rhythm.     Heart sounds: Normal heart sounds.  Pulmonary:     Effort: Pulmonary effort is normal.     Breath sounds: Normal breath sounds.  Abdominal:     General: Bowel sounds are normal.     Palpations: Abdomen is soft.  Musculoskeletal:        General: No tenderness or deformity. Normal range of motion.     Cervical back: Normal range of motion.  Lymphadenopathy:     Cervical: No cervical adenopathy.  Skin:    General: Skin is warm and dry.     Findings: No erythema or rash.  Neurological:     Mental Status: She is alert and oriented to person, place, and time.  Psychiatric:        Behavior: Behavior normal.        Thought Content: Thought content normal.        Judgment: Judgment normal.      Lab Results  Component Value Date   WBC 7.9 02/09/2024   HGB 14.4 02/09/2024   HCT 42.6 02/09/2024   MCV 90.3 02/09/2024   PLT 264 02/09/2024     Chemistry      Component Value Date/Time   NA 138 02/09/2024 1513   NA 147 (H) 12/28/2016 1320   K 4.0 02/09/2024 1513   CL 102 02/09/2024 1513   CO2 28 02/09/2024 1513   BUN 15 02/09/2024 1513   BUN 10 12/28/2016 1320   CREATININE 0.90 02/09/2024 1513   CREATININE 0.81 11/15/2020 1444      Component Value Date/Time   CALCIUM 9.9 02/09/2024 1513   ALKPHOS 63 02/09/2024 1513   AST 15 02/09/2024 1513   ALT 11 02/09/2024 1513   BILITOT 0.4 02/09/2024  1513       Impression and Plan: [name] is a very charming 73 year old postmenopausal white female.  She has a very early stage ductal carcinoma of the  left breast.  She underwent lumpectomy followed by radiotherapy.  Again she does not wish to be on an aromatase inhibitor.  Again I do believe that her risk of recurrence is still can be incredibly low.  I know that this is a very personal decision for her.  I really do respect that.  At this point, we will plan to get her back to see [place] probably in a couple months or so.  I want to make sure that everything is doing okay.  If all looks good in a couple of months, then I will try to get her through the Summer.     Josph Macho, MD 4/9/20254:54 PM

## 2024-02-10 LAB — CANCER ANTIGEN 27.29: CA 27.29: 21.4 U/mL (ref 0.0–38.6)

## 2024-03-07 ENCOUNTER — Telehealth: Payer: Self-pay | Admitting: *Deleted

## 2024-03-07 NOTE — Telephone Encounter (Signed)
-----   Message from Conway Dennis sent at 03/07/2024 12:31 PM EDT ----- Please call patient and let her know that her lung cancer screening is negative.  I recommend that she f/u with Dr. Maria Shiner or her PCP to ensure that this gets repeated annually ----- Message ----- From: Interface, Rad Results In Sent: 03/04/2024   4:03 PM EDT To: Percival Brace, NP

## 2024-03-07 NOTE — Telephone Encounter (Signed)
 Per Alwin Baars, NP, called pt with message below. Pt had questions about mild emphysema diagnosis. Educated pt on emphysema and and advised to f/u with PCP for further evaluation. Pt verbalized understanding.

## 2024-04-07 ENCOUNTER — Inpatient Hospital Stay: Admitting: Hematology & Oncology

## 2024-04-07 ENCOUNTER — Inpatient Hospital Stay: Attending: Hematology and Oncology

## 2024-04-07 ENCOUNTER — Inpatient Hospital Stay

## 2024-04-07 ENCOUNTER — Telehealth: Payer: Self-pay

## 2024-04-07 VITALS — BP 117/78 | HR 69 | Temp 98.1°F | Resp 16 | Ht 66.0 in | Wt 214.0 lb

## 2024-04-07 DIAGNOSIS — Z170 Estrogen receptor positive status [ER+]: Secondary | ICD-10-CM

## 2024-04-07 DIAGNOSIS — C50412 Malignant neoplasm of upper-outer quadrant of left female breast: Secondary | ICD-10-CM

## 2024-04-07 DIAGNOSIS — Z08 Encounter for follow-up examination after completed treatment for malignant neoplasm: Secondary | ICD-10-CM | POA: Insufficient documentation

## 2024-04-07 DIAGNOSIS — Z853 Personal history of malignant neoplasm of breast: Secondary | ICD-10-CM | POA: Diagnosis present

## 2024-04-07 LAB — CBC WITH DIFFERENTIAL (CANCER CENTER ONLY)
Abs Immature Granulocytes: 0.02 10*3/uL (ref 0.00–0.07)
Basophils Absolute: 0.1 10*3/uL (ref 0.0–0.1)
Basophils Relative: 1 %
Eosinophils Absolute: 0.1 10*3/uL (ref 0.0–0.5)
Eosinophils Relative: 1 %
HCT: 38.7 % (ref 36.0–46.0)
Hemoglobin: 12.9 g/dL (ref 12.0–15.0)
Immature Granulocytes: 0 %
Lymphocytes Relative: 29 %
Lymphs Abs: 2.6 10*3/uL (ref 0.7–4.0)
MCH: 30.4 pg (ref 26.0–34.0)
MCHC: 33.3 g/dL (ref 30.0–36.0)
MCV: 91.1 fL (ref 80.0–100.0)
Monocytes Absolute: 0.7 10*3/uL (ref 0.1–1.0)
Monocytes Relative: 7 %
Neutro Abs: 5.5 10*3/uL (ref 1.7–7.7)
Neutrophils Relative %: 62 %
Platelet Count: 290 10*3/uL (ref 150–400)
RBC: 4.25 MIL/uL (ref 3.87–5.11)
RDW: 13 % (ref 11.5–15.5)
WBC Count: 8.9 10*3/uL (ref 4.0–10.5)
nRBC: 0 % (ref 0.0–0.2)

## 2024-04-07 LAB — CMP (CANCER CENTER ONLY)
ALT: 14 U/L (ref 0–44)
AST: 14 U/L — ABNORMAL LOW (ref 15–41)
Albumin: 4 g/dL (ref 3.5–5.0)
Alkaline Phosphatase: 70 U/L (ref 38–126)
Anion gap: 7 (ref 5–15)
BUN: 11 mg/dL (ref 8–23)
CO2: 28 mmol/L (ref 22–32)
Calcium: 9.1 mg/dL (ref 8.9–10.3)
Chloride: 107 mmol/L (ref 98–111)
Creatinine: 0.93 mg/dL (ref 0.44–1.00)
GFR, Estimated: >60 mL/min (ref >60)
Glucose, Bld: 95 mg/dL (ref 70–99)
Potassium: 4.5 mmol/L (ref 3.5–5.1)
Sodium: 142 mmol/L (ref 135–145)
Total Bilirubin: 0.5 mg/dL (ref 0.0–1.2)
Total Protein: 6.8 g/dL (ref 6.5–8.1)

## 2024-04-07 LAB — LACTATE DEHYDROGENASE: LDH: 119 U/L (ref 98–192)

## 2024-04-07 NOTE — Progress Notes (Signed)
 Hematology and Oncology Follow Up Visit  [name] [id number] Mar 29, 1951 73 y.o. 04/07/2024   Principle Diagnosis:  Stage 1A (T1cN0M0) infiltrating ductal carcinoma of the left breast - ER+/PR+/HER2- --Oncotype = 8  Current Therapy:   Status post left lumpectomy on 07/13/2023 Status post radiotherapy-completed in 10/22/2023 Patient wishes not to take any aromatase inhibitors.     Interim History:  [name] is back for her follow-up.  She was worried about her weight gain.  She is trying her best to lose weight.  I told her that I am sure that she will lose weight.  She does has to be patient.  She is needs to drink a lot of water.  She does have a lot of fruits and vegetables.  Otherwise, she really has had no complaints.  She is trying to stay active.  She is trying to walk.  She was on a semaglutide compound.  I am unsure if she still is taking this.  She did not want to be on any aromatase inhibitor.  As such, we are just watching her.  I think we are okay since she has a very early stage breast cancer and also has a low Oncotype score.  She has had no problems with fever.  She has had no cough or shortness of breath.  She has had no change in bowel or bladder habits.  She has had no rashes.  Overall l, I would say that her performance status is probably ECOG 1.    Medications:  Current Outpatient Medications:    ALPRAZolam  (XANAX ) 0.5 MG tablet, Take 0.5 mg by mouth at bedtime as needed for anxiety., Disp: , Rfl:    anastrozole  (ARIMIDEX ) 1 MG tablet, Take 1 tablet (1 mg total) by mouth daily. (Patient not taking: Reported on 02/09/2024), Disp: 90 tablet, Rfl: 3   Ascorbic Acid (VITAMIN C PO), Take 1 tablet by mouth daily., Disp: , Rfl:    Biotin 10 MG TABS, Take 10 mg by mouth as needed (switch up with meds)., Disp: , Rfl:    Calcium-Magnesium-Vitamin D  (CALCIUM MAGNESIUM PO), Take 1 tablet by mouth daily., Disp: , Rfl:    Cholecalciferol (VITAMIN D3) 10 MCG (400 UNIT)  CAPS, Take 1 tablet by mouth daily., Disp: , Rfl:    co-enzyme Q-10 30 MG capsule, Take 30 mg by mouth once a week., Disp: , Rfl:    Cyanocobalamin  (VITAMIN B 12 PO), Take 1 tablet by mouth daily., Disp: , Rfl:    Multiple Vitamin (MULTIVITAMIN) tablet, Take 1 tablet by mouth daily., Disp: , Rfl:    Omega 3 1000 MG CAPS, Take 1 capsule by mouth daily., Disp: , Rfl:    UNABLE TO FIND, Inject 1 mg as directed once a week. Med Name: Semiglutide that is Compounded. Gets at [facility]., Disp: , Rfl:    Vitamin D , Ergocalciferol , (DRISDOL ) 1.25 MG (50000 UNIT) CAPS capsule, Take 50,000 Units by mouth every 7 (seven) days., Disp: , Rfl:    vitamin E 1000 UNIT capsule, Take 1,000 Units by mouth daily., Disp: , Rfl:   Allergies: No Known Allergies  Past Medical History, Surgical history, Social history, and Family History were reviewed and updated.  Review of Systems: Review of Systems  Constitutional: Negative.   HENT:  Negative.    Eyes: Negative.   Respiratory: Negative.    Cardiovascular: Negative.   Gastrointestinal: Negative.   Endocrine: Negative.   Genitourinary: Negative.    Musculoskeletal: Negative.   Skin: Negative.  Neurological: Negative.   Hematological: Negative.   Psychiatric/Behavioral: Negative.      Physical Exam: Vital signs are temperature of 98.1.  Pulse 69.  Blood pressure 117/78.  Weight is 214 pounds.  Wt Readings from Last 3 Encounters:  02/09/24 224 lb 3.2 oz (101.7 kg)  01/10/24 224 lb 3.2 oz (101.7 kg)  11/25/23 223 lb 8 oz (101.4 kg)    Physical Exam Vitals reviewed.  Constitutional:      Comments: Her breast exam shows right breast with no masses, edema or erythema.  There is no right axillary adenopathy.  Left breast shows the well-healed lumpectomy scar at about the 1 o'clock position.  She has a little firmness at the lumpectomy site.  There is no adenopathy in the left axilla.  There is no left nipple discharge.  HENT:     Head: Normocephalic and  atraumatic.  Eyes:     Pupils: Pupils are equal, round, and reactive to light.  Cardiovascular:     Rate and Rhythm: Normal rate and regular rhythm.     Heart sounds: Normal heart sounds.  Pulmonary:     Effort: Pulmonary effort is normal.     Breath sounds: Normal breath sounds.  Abdominal:     General: Bowel sounds are normal.     Palpations: Abdomen is soft.  Musculoskeletal:        General: No tenderness or deformity. Normal range of motion.     Cervical back: Normal range of motion.  Lymphadenopathy:     Cervical: No cervical adenopathy.  Skin:    General: Skin is warm and dry.     Findings: No erythema or rash.  Neurological:     Mental Status: She is alert and oriented to person, place, and time.  Psychiatric:        Behavior: Behavior normal.        Thought Content: Thought content normal.        Judgment: Judgment normal.      Lab Results  Component Value Date   WBC 7.9 02/09/2024   HGB 14.4 02/09/2024   HCT 42.6 02/09/2024   MCV 90.3 02/09/2024   PLT 264 02/09/2024     Chemistry      Component Value Date/Time   NA 138 02/09/2024 1513   NA 147 (H) 12/28/2016 1320   K 4.0 02/09/2024 1513   CL 102 02/09/2024 1513   CO2 28 02/09/2024 1513   BUN 15 02/09/2024 1513   BUN 10 12/28/2016 1320   CREATININE 0.90 02/09/2024 1513   CREATININE 0.81 11/15/2020 1444      Component Value Date/Time   CALCIUM 9.9 02/09/2024 1513   ALKPHOS 63 02/09/2024 1513   AST 15 02/09/2024 1513   ALT 11 02/09/2024 1513   BILITOT 0.4 02/09/2024 1513       Impression and Plan: [name] is a very charming 73 year old postmenopausal white female.  She has a very early stage ductal carcinoma of the left breast.  She underwent lumpectomy followed by radiotherapy.  Again she does not wish to be on an aromatase inhibitor.  I am sure that she will lose weight.  I know that the weight loss will certainly help with decreasing the risk of breast cancer recurrence.  Again I told her  that she has to be patient and have the weight come off gradually.  We can now get her back after the Labor Day holiday.  I feel confident that we can get her through most  of Summer.  I am sure that when she comes back, she will will weight less than 200 pounds.   Ivor Mars, MD 6/6/20251:49 PM

## 2024-04-07 NOTE — Telephone Encounter (Signed)
 Mychart message sent per pt request.

## 2024-04-20 ENCOUNTER — Ambulatory Visit: Admitting: Hematology & Oncology

## 2024-04-20 ENCOUNTER — Other Ambulatory Visit

## 2024-07-10 ENCOUNTER — Encounter: Payer: Self-pay | Admitting: Adult Health

## 2024-07-12 ENCOUNTER — Inpatient Hospital Stay: Attending: Hematology and Oncology

## 2024-07-12 ENCOUNTER — Ambulatory Visit: Admitting: Hematology and Oncology

## 2024-07-12 ENCOUNTER — Encounter: Payer: Self-pay | Admitting: Hematology & Oncology

## 2024-07-12 ENCOUNTER — Inpatient Hospital Stay: Admitting: Hematology & Oncology

## 2024-07-12 ENCOUNTER — Inpatient Hospital Stay: Admitting: Licensed Clinical Social Worker

## 2024-07-12 VITALS — BP 107/61 | HR 76 | Temp 98.6°F | Resp 20 | Ht 66.0 in | Wt 214.1 lb

## 2024-07-12 DIAGNOSIS — C50412 Malignant neoplasm of upper-outer quadrant of left female breast: Secondary | ICD-10-CM | POA: Diagnosis not present

## 2024-07-12 DIAGNOSIS — Z853 Personal history of malignant neoplasm of breast: Secondary | ICD-10-CM | POA: Insufficient documentation

## 2024-07-12 DIAGNOSIS — Z170 Estrogen receptor positive status [ER+]: Secondary | ICD-10-CM | POA: Diagnosis not present

## 2024-07-12 DIAGNOSIS — Z08 Encounter for follow-up examination after completed treatment for malignant neoplasm: Secondary | ICD-10-CM | POA: Insufficient documentation

## 2024-07-12 LAB — CBC WITH DIFFERENTIAL (CANCER CENTER ONLY)
Abs Immature Granulocytes: 0.02 K/uL (ref 0.00–0.07)
Basophils Absolute: 0.1 K/uL (ref 0.0–0.1)
Basophils Relative: 1 %
Eosinophils Absolute: 0.1 K/uL (ref 0.0–0.5)
Eosinophils Relative: 1 %
HCT: 42 % (ref 36.0–46.0)
Hemoglobin: 14 g/dL (ref 12.0–15.0)
Immature Granulocytes: 0 %
Lymphocytes Relative: 27 %
Lymphs Abs: 2.2 K/uL (ref 0.7–4.0)
MCH: 30.6 pg (ref 26.0–34.0)
MCHC: 33.3 g/dL (ref 30.0–36.0)
MCV: 91.9 fL (ref 80.0–100.0)
Monocytes Absolute: 0.6 K/uL (ref 0.1–1.0)
Monocytes Relative: 7 %
Neutro Abs: 5.4 K/uL (ref 1.7–7.7)
Neutrophils Relative %: 64 %
Platelet Count: 288 K/uL (ref 150–400)
RBC: 4.57 MIL/uL (ref 3.87–5.11)
RDW: 12.5 % (ref 11.5–15.5)
WBC Count: 8.4 K/uL (ref 4.0–10.5)
nRBC: 0 % (ref 0.0–0.2)

## 2024-07-12 LAB — CMP (CANCER CENTER ONLY)
ALT: 22 U/L (ref 0–44)
AST: 19 U/L (ref 15–41)
Albumin: 4.1 g/dL (ref 3.5–5.0)
Alkaline Phosphatase: 93 U/L (ref 38–126)
Anion gap: 12 (ref 5–15)
BUN: 12 mg/dL (ref 8–23)
CO2: 24 mmol/L (ref 22–32)
Calcium: 9.4 mg/dL (ref 8.9–10.3)
Chloride: 108 mmol/L (ref 98–111)
Creatinine: 0.85 mg/dL (ref 0.44–1.00)
GFR, Estimated: >60 mL/min (ref >60)
Glucose, Bld: 101 mg/dL — ABNORMAL HIGH (ref 70–99)
Potassium: 4 mmol/L (ref 3.5–5.1)
Sodium: 143 mmol/L (ref 135–145)
Total Bilirubin: 0.3 mg/dL (ref 0.0–1.2)
Total Protein: 7.1 g/dL (ref 6.5–8.1)

## 2024-07-12 LAB — LACTATE DEHYDROGENASE: LDH: 132 U/L (ref 98–192)

## 2024-07-12 NOTE — Progress Notes (Signed)
 [facility] Clinical Social Work  Clinical Social Work was referred by medical provider for emotional support.  Clinical Social Worker met with patient to offer support and assess for needs.     Interventions: Provided brief mental health counseling with regard to her nephew causing increased anxiety in her life. Patient stated she did not wish to be referred to a counselor at this time.  She relies on her strong faith and prays often.  She has a good support system, including her church, daughter and fiance.      Follow Up Plan:  Patient will contact CSW with any support or resource needs    Macario CHRISTELLA Au, LCSW  Clinical Social Worker [facility]

## 2024-07-12 NOTE — Progress Notes (Signed)
 Hematology and Oncology Follow Up Visit  [name] [id number] 05-Jul-1951 73 y.o. 07/12/2024   Principle Diagnosis:  Stage 1A (T1cN0M0) infiltrating ductal carcinoma of the left breast - ER+/PR+/HER2- --Oncotype = 8  Current Therapy:   Status post left lumpectomy on 07/13/2023 Status post radiotherapy-completed in 10/22/2023 Patient wishes not to take any aromatase inhibitors.     Interim History:  [name] is back for her follow-up.  Unfortunately, she is under a lot of stress.  There is apparently a huge family issue that she cannot help with.  This really has caused her a lot of stress and anxiety.  She thinks that because of this, she not able to lose weight.  She is thinking about getting back on one of the new weight loss drugs.  I told her that I saw no problems with this.  She has had no issues with cough.  She has had no change in bowel or bladder habits.  She has had no rashes.  There is been no bleeding.  She has had no headache.  Again, she is just  under a lot of stress because of a family issue.  Overall, I would say that her performance status is probably ECOG 1.       Medications:  Current Outpatient Medications:    Ascorbic Acid (VITAMIN C PO), Take 1 tablet by mouth daily., Disp: , Rfl:    Biotin 10 MG TABS, Take 10 mg by mouth as needed (switch up with meds)., Disp: , Rfl:    Calcium-Magnesium-Vitamin D  (CALCIUM MAGNESIUM PO), Take 1 tablet by mouth daily., Disp: , Rfl:    Cyanocobalamin  (VITAMIN B 12 PO), Take 1 tablet by mouth daily., Disp: , Rfl:    diazepam  (VALIUM ) 5 MG tablet, Take 5 mg by mouth at bedtime as needed., Disp: , Rfl:    Multiple Vitamin (MULTIVITAMIN) tablet, Take 1 tablet by mouth daily., Disp: , Rfl:    traZODone (DESYREL) 50 MG tablet, 50 mg at bedtime as needed., Disp: , Rfl:    vitamin E 1000 UNIT capsule, Take 1,000 Units by mouth daily., Disp: , Rfl:    co-enzyme Q-10 30 MG capsule, Take 30 mg by mouth once a week. (Patient not  taking: Reported on 07/12/2024), Disp: , Rfl:    Omega 3 1000 MG CAPS, Take 1 capsule by mouth daily. (Patient not taking: Reported on 07/12/2024), Disp: , Rfl:   Allergies: No Known Allergies  Past Medical History, Surgical history, Social history, and Family History were reviewed and updated.  Review of Systems: Review of Systems  Constitutional: Negative.   HENT:  Negative.    Eyes: Negative.   Respiratory: Negative.    Cardiovascular: Negative.   Gastrointestinal: Negative.   Endocrine: Negative.   Genitourinary: Negative.    Musculoskeletal: Negative.   Skin: Negative.   Neurological: Negative.   Hematological: Negative.   Psychiatric/Behavioral: Negative.      Physical Exam: Vital signs are temperature of 98.1.  Pulse 69.  Blood pressure 107/61.  Weight is 214 pounds.  Wt Readings from Last 3 Encounters:  07/12/24 214 lb 1.9 oz (97.1 kg)  04/07/24 214 lb (97.1 kg)  02/09/24 224 lb 3.2 oz (101.7 kg)    Physical Exam Vitals reviewed.  Constitutional:      Comments: Her breast exam shows right breast with no masses, edema or erythema.  There is no right axillary adenopathy.  Left breast shows the well-healed lumpectomy scar at about the 1 o'clock position.  She has a little firmness at the lumpectomy site.  There is no adenopathy in the left axilla.  There is no left nipple discharge.  HENT:     Head: Normocephalic and atraumatic.  Eyes:     Pupils: Pupils are equal, round, and reactive to light.  Cardiovascular:     Rate and Rhythm: Normal rate and regular rhythm.     Heart sounds: Normal heart sounds.  Pulmonary:     Effort: Pulmonary effort is normal.     Breath sounds: Normal breath sounds.  Abdominal:     General: Bowel sounds are normal.     Palpations: Abdomen is soft.  Musculoskeletal:        General: No tenderness or deformity. Normal range of motion.     Cervical back: Normal range of motion.  Lymphadenopathy:     Cervical: No cervical adenopathy.   Skin:    General: Skin is warm and dry.     Findings: No erythema or rash.  Neurological:     Mental Status: She is alert and oriented to person, place, and time.  Psychiatric:        Behavior: Behavior normal.        Thought Content: Thought content normal.        Judgment: Judgment normal.      Lab Results  Component Value Date   WBC 8.4 07/12/2024   HGB 14.0 07/12/2024   HCT 42.0 07/12/2024   MCV 91.9 07/12/2024   PLT 288 07/12/2024     Chemistry      Component Value Date/Time   NA 143 07/12/2024 1327   NA 147 (H) 12/28/2016 1320   K 4.0 07/12/2024 1327   CL 108 07/12/2024 1327   CO2 24 07/12/2024 1327   BUN 12 07/12/2024 1327   BUN 10 12/28/2016 1320   CREATININE 0.85 07/12/2024 1327   CREATININE 0.81 11/15/2020 1444      Component Value Date/Time   CALCIUM 9.4 07/12/2024 1327   ALKPHOS 93 07/12/2024 1327   AST 19 07/12/2024 1327   ALT 22 07/12/2024 1327   BILITOT 0.3 07/12/2024 1327       Impression and Plan: [name] is a very charming 73 year old postmenopausal white female.  She has a very early stage ductal carcinoma of the left breast.  She underwent lumpectomy followed by radiotherapy.  Again she does not wish to be on an aromatase inhibitor.  Hopefully, she will be able to handle the stress.  I am having Hendricks, our Child psychotherapist, talk to her.  Hopefully she will be able to provide [name] some guidance.  From my point of view, we will plan to get her back to see us  in another 4 months or so.  We will get her through the Holiday season.  [name], MD 9/10/20252:01 PM

## 2024-11-13 ENCOUNTER — Inpatient Hospital Stay

## 2024-11-13 ENCOUNTER — Inpatient Hospital Stay: Admitting: Family

## 2024-11-27 ENCOUNTER — Inpatient Hospital Stay

## 2024-11-27 ENCOUNTER — Inpatient Hospital Stay: Admitting: Hematology & Oncology

## 2024-12-01 ENCOUNTER — Inpatient Hospital Stay: Admitting: Hematology & Oncology

## 2024-12-01 ENCOUNTER — Inpatient Hospital Stay: Attending: Hematology & Oncology

## 2024-12-01 ENCOUNTER — Encounter: Payer: Self-pay | Admitting: Hematology & Oncology

## 2024-12-01 VITALS — BP 118/78 | HR 77 | Temp 98.3°F | Resp 20 | Wt 203.0 lb

## 2024-12-01 DIAGNOSIS — Z170 Estrogen receptor positive status [ER+]: Secondary | ICD-10-CM | POA: Diagnosis not present

## 2024-12-01 DIAGNOSIS — C50412 Malignant neoplasm of upper-outer quadrant of left female breast: Secondary | ICD-10-CM | POA: Diagnosis not present

## 2024-12-01 LAB — CBC WITH DIFFERENTIAL (CANCER CENTER ONLY)
Abs Immature Granulocytes: 0.02 10*3/uL (ref 0.00–0.07)
Basophils Absolute: 0.1 10*3/uL (ref 0.0–0.1)
Basophils Relative: 1 %
Eosinophils Absolute: 0 10*3/uL (ref 0.0–0.5)
Eosinophils Relative: 1 %
HCT: 43.3 % (ref 36.0–46.0)
Hemoglobin: 14.5 g/dL (ref 12.0–15.0)
Immature Granulocytes: 0 %
Lymphocytes Relative: 28 %
Lymphs Abs: 2.3 10*3/uL (ref 0.7–4.0)
MCH: 30.5 pg (ref 26.0–34.0)
MCHC: 33.5 g/dL (ref 30.0–36.0)
MCV: 91.2 fL (ref 80.0–100.0)
Monocytes Absolute: 0.7 10*3/uL (ref 0.1–1.0)
Monocytes Relative: 8 %
Neutro Abs: 5.1 10*3/uL (ref 1.7–7.7)
Neutrophils Relative %: 62 %
Platelet Count: 272 10*3/uL (ref 150–400)
RBC: 4.75 MIL/uL (ref 3.87–5.11)
RDW: 12.8 % (ref 11.5–15.5)
WBC Count: 8.2 10*3/uL (ref 4.0–10.5)
nRBC: 0 % (ref 0.0–0.2)

## 2024-12-01 LAB — CMP (CANCER CENTER ONLY)
ALT: 14 U/L (ref 0–44)
AST: 17 U/L (ref 15–41)
Albumin: 4.3 g/dL (ref 3.5–5.0)
Alkaline Phosphatase: 79 U/L (ref 38–126)
Anion gap: 12 (ref 5–15)
BUN: 15 mg/dL (ref 8–23)
CO2: 27 mmol/L (ref 22–32)
Calcium: 9.9 mg/dL (ref 8.9–10.3)
Chloride: 104 mmol/L (ref 98–111)
Creatinine: 0.86 mg/dL (ref 0.44–1.00)
GFR, Estimated: >60 mL/min
Glucose, Bld: 93 mg/dL (ref 70–99)
Potassium: 4.7 mmol/L (ref 3.5–5.1)
Sodium: 142 mmol/L (ref 135–145)
Total Bilirubin: 0.4 mg/dL (ref 0.0–1.2)
Total Protein: 7.3 g/dL (ref 6.5–8.1)

## 2024-12-01 NOTE — Progress Notes (Signed)
 " Hematology and Oncology Follow Up Visit  [name] [id number] 1950/11/23 74 y.o. 12/01/2024   Principle Diagnosis:  Stage 1A (T1cN0M0) infiltrating ductal carcinoma of the left breast - ER+/PR+/HER2- --Oncotype = 8  Current Therapy:   Status post left lumpectomy on 07/13/2023 Status post radiotherapy-completed in 10/22/2023 Patient wishes not to take any aromatase inhibitors.     Interim History:  [name] is back for her follow-up.  She is doing a whole lot better now.  Her mother, unfortunately, passed away in 07/12/2025.  I know there is a lot of stress associated with this.  It seems like a lot of stress is now gone.  She is looking forward to this year.  She is going to have some cosmetic surgery for her face.  I think she do not have some kind of dermal procedure for her face.  Otherwise, she is losing weight.  She now is on Zepbound.  She is happy about being on Zepbound.  She has lost about 10 pounds since we last saw her.  She has had no change in bowel or bladder habits.  There has been no diarrhea.  There has been no cough or shortness of breath.  She has had no issues with nausea or vomiting..  She has had no issues with rashes.  There is been no bleeding.  Overall, I would have to say that her performance status is probably ECOG 1.     Medications:  Current Outpatient Medications:    Ascorbic Acid (VITAMIN C PO), Take 1 tablet by mouth daily., Disp: , Rfl:    aspirin EC 81 MG tablet, Take 81 mg by mouth., Disp: , Rfl:    Biotin 10 MG TABS, Take 10 mg by mouth as needed (switch up with meds)., Disp: , Rfl:    Calcium-Magnesium-Vitamin D  (CALCIUM MAGNESIUM PO), Take 1 tablet by mouth daily., Disp: , Rfl:    Cholecalciferol (VITAMIN D ) 50 MCG (2000 UT) CAPS, Take by mouth., Disp: , Rfl:    co-enzyme Q-10 30 MG capsule, Take 30 mg by mouth once a week., Disp: , Rfl:    Cyanocobalamin  (VITAMIN B 12 PO), Take 1 tablet by mouth daily., Disp: , Rfl:    diazepam  (VALIUM )  5 MG tablet, Take 5 mg by mouth at bedtime as needed., Disp: , Rfl:    Multiple Vitamin (MULTIVITAMIN) tablet, Take 1 tablet by mouth daily., Disp: , Rfl:    Omega 3 1000 MG CAPS, Take 1 capsule by mouth daily. (Patient taking differently: Take 1 capsule by mouth daily as needed.), Disp: , Rfl:    tirzepatide (ZEPBOUND) 5 MG/0.5ML injection vial, Inject 0.5 mg into the skin once a week., Disp: , Rfl:    VALTREX 500 MG tablet, Take 500 mg by mouth 2 (two) times daily., Disp: , Rfl:    vitamin E 1000 UNIT capsule, Take 1,000 Units by mouth daily., Disp: , Rfl:    ZEPBOUND 2.5 MG/0.5ML injection vial, 2.5 mg once a week., Disp: , Rfl:    hydroquinone 4 % cream, Apply topically. (Patient not taking: Reported on 12/01/2024), Disp: , Rfl:    tretinoin (RETIN-A) 0.1 % cream, Apply topically at bedtime. (Patient not taking: Reported on 12/01/2024), Disp: , Rfl:   Allergies: No Known Allergies  Past Medical History, Surgical history, Social history, and Family History were reviewed and updated.  Review of Systems: Review of Systems  Constitutional: Negative.   HENT:  Negative.    Eyes: Negative.  Respiratory: Negative.    Cardiovascular: Negative.   Gastrointestinal: Negative.   Endocrine: Negative.   Genitourinary: Negative.    Musculoskeletal: Negative.   Skin: Negative.   Neurological: Negative.   Hematological: Negative.   Psychiatric/Behavioral: Negative.      Physical Exam: Vital signs are temperature of 98.3.  Pulse 77.  Blood pressure 118/78.  Weight is 203 pounds.    Wt Readings from Last 3 Encounters:  12/01/24 203 lb (92.1 kg)  07/12/24 214 lb 1.9 oz (97.1 kg)  04/07/24 214 lb (97.1 kg)    Physical Exam Vitals reviewed.  Constitutional:      Comments: Her breast exam shows right breast with no masses, edema or erythema.  There is no right axillary adenopathy.  Left breast shows the well-healed lumpectomy scar at about the 1 o'clock position.  She has a little firmness at  the lumpectomy site.  There is no adenopathy in the left axilla.  There is no left nipple discharge.  HENT:     Head: Normocephalic and atraumatic.  Eyes:     Pupils: Pupils are equal, round, and reactive to light.  Cardiovascular:     Rate and Rhythm: Normal rate and regular rhythm.     Heart sounds: Normal heart sounds.  Pulmonary:     Effort: Pulmonary effort is normal.     Breath sounds: Normal breath sounds.  Abdominal:     General: Bowel sounds are normal.     Palpations: Abdomen is soft.  Musculoskeletal:        General: No tenderness or deformity. Normal range of motion.     Cervical back: Normal range of motion.  Lymphadenopathy:     Cervical: No cervical adenopathy.  Skin:    General: Skin is warm and dry.     Findings: No erythema or rash.  Neurological:     Mental Status: She is alert and oriented to person, place, and time.  Psychiatric:        Behavior: Behavior normal.        Thought Content: Thought content normal.        Judgment: Judgment normal.      Lab Results  Component Value Date   WBC 8.2 12/01/2024   HGB 14.5 12/01/2024   HCT 43.3 12/01/2024   MCV 91.2 12/01/2024   PLT 272 12/01/2024     Chemistry      Component Value Date/Time   NA 142 12/01/2024 1503   NA 147 (H) 12/28/2016 1320   K 4.7 12/01/2024 1503   CL 104 12/01/2024 1503   CO2 27 12/01/2024 1503   BUN 15 12/01/2024 1503   BUN 10 12/28/2016 1320   CREATININE 0.86 12/01/2024 1503   CREATININE 0.81 11/15/2020 1444      Component Value Date/Time   CALCIUM 9.9 12/01/2024 1503   ALKPHOS 79 12/01/2024 1503   AST 17 12/01/2024 1503   ALT 14 12/01/2024 1503   BILITOT 0.4 12/01/2024 1503       Impression and Plan: [name] is a very charming 74 year old postmenopausal white female.  She has she had a birthday on [birth year]'s [birth year].  She had a wonderful birthday.  I am just happy that she is not having the stress that she had when we last saw her.  She is a lot happier.  She is happy  that she is losing the weight.  She has a very early stage ductal carcinoma of the left breast.  She underwent lumpectomy followed by  radiotherapy.  Again she does not wish to be on an aromatase inhibitor.  She will be due for mammogram in September.  I am happy that she is feeling so well.  I know she is looking forward to her procedures to try to help with her skin and I do not help make her feel better.  We will plan to get her back in 4 months.  [name], MD 1/30/20263:55 PM "

## 2025-03-30 ENCOUNTER — Inpatient Hospital Stay

## 2025-03-30 ENCOUNTER — Inpatient Hospital Stay: Admitting: Hematology & Oncology
